# Patient Record
Sex: Female | Born: 1965 | Race: White | Hispanic: No | Smoking: Former smoker
Health system: Southern US, Community
[De-identification: ages and names within clinical notes are randomized; demographics above are authoritative.]

## PROBLEM LIST (undated history)

## (undated) DIAGNOSIS — M25551 Pain in right hip: Secondary | ICD-10-CM

## (undated) DIAGNOSIS — M5134 Other intervertebral disc degeneration, thoracic region: Secondary | ICD-10-CM

## (undated) DIAGNOSIS — M1990 Unspecified osteoarthritis, unspecified site: Secondary | ICD-10-CM

## (undated) DIAGNOSIS — L0390 Cellulitis, unspecified: Secondary | ICD-10-CM

## (undated) DIAGNOSIS — J45909 Unspecified asthma, uncomplicated: Secondary | ICD-10-CM

## (undated) DIAGNOSIS — I10 Essential (primary) hypertension: Secondary | ICD-10-CM

## (undated) DIAGNOSIS — K219 Gastro-esophageal reflux disease without esophagitis: Secondary | ICD-10-CM

## (undated) DIAGNOSIS — M419 Scoliosis, unspecified: Secondary | ICD-10-CM

## (undated) DIAGNOSIS — G8929 Other chronic pain: Secondary | ICD-10-CM

## (undated) DIAGNOSIS — K589 Irritable bowel syndrome without diarrhea: Secondary | ICD-10-CM

## (undated) DIAGNOSIS — F419 Anxiety disorder, unspecified: Secondary | ICD-10-CM

## (undated) HISTORY — PX: CHOLECYSTECTOMY: SHX55

## (undated) HISTORY — PX: TUBAL LIGATION: SHX77

## (undated) HISTORY — PX: ABDOMINAL HYSTERECTOMY: SHX81

---

## 2004-04-27 ENCOUNTER — Emergency Department: Payer: Self-pay | Admitting: Emergency Medicine

## 2004-04-30 ENCOUNTER — Ambulatory Visit: Payer: Self-pay | Admitting: Emergency Medicine

## 2004-06-09 ENCOUNTER — Inpatient Hospital Stay: Payer: Self-pay | Admitting: Internal Medicine

## 2004-09-16 ENCOUNTER — Emergency Department: Payer: Self-pay | Admitting: Unknown Physician Specialty

## 2004-11-01 ENCOUNTER — Emergency Department: Payer: Self-pay | Admitting: Emergency Medicine

## 2005-01-16 ENCOUNTER — Emergency Department: Payer: Self-pay | Admitting: Internal Medicine

## 2005-03-23 ENCOUNTER — Emergency Department: Payer: Self-pay | Admitting: Emergency Medicine

## 2005-03-29 ENCOUNTER — Emergency Department: Payer: Self-pay | Admitting: Unknown Physician Specialty

## 2005-07-25 ENCOUNTER — Other Ambulatory Visit: Payer: Self-pay

## 2005-07-25 ENCOUNTER — Emergency Department: Payer: Self-pay | Admitting: Emergency Medicine

## 2005-07-29 ENCOUNTER — Emergency Department: Payer: Self-pay | Admitting: Emergency Medicine

## 2005-07-29 ENCOUNTER — Other Ambulatory Visit: Payer: Self-pay

## 2005-08-16 ENCOUNTER — Emergency Department: Payer: Self-pay | Admitting: Emergency Medicine

## 2005-08-23 ENCOUNTER — Emergency Department: Payer: Self-pay | Admitting: Unknown Physician Specialty

## 2006-03-21 ENCOUNTER — Emergency Department: Payer: Self-pay | Admitting: Emergency Medicine

## 2007-07-01 ENCOUNTER — Emergency Department: Payer: Self-pay | Admitting: Emergency Medicine

## 2008-03-25 ENCOUNTER — Emergency Department: Payer: Self-pay | Admitting: Emergency Medicine

## 2008-05-24 ENCOUNTER — Emergency Department: Payer: Self-pay | Admitting: Emergency Medicine

## 2008-06-04 ENCOUNTER — Emergency Department: Payer: Self-pay | Admitting: Internal Medicine

## 2008-08-06 ENCOUNTER — Emergency Department: Payer: Self-pay | Admitting: Emergency Medicine

## 2008-11-19 ENCOUNTER — Emergency Department: Payer: Self-pay | Admitting: Emergency Medicine

## 2009-01-05 ENCOUNTER — Emergency Department: Payer: Self-pay | Admitting: Emergency Medicine

## 2009-01-15 ENCOUNTER — Ambulatory Visit: Payer: Self-pay

## 2009-01-22 ENCOUNTER — Ambulatory Visit: Payer: Self-pay

## 2009-01-27 ENCOUNTER — Emergency Department: Payer: Self-pay | Admitting: Emergency Medicine

## 2009-05-14 ENCOUNTER — Inpatient Hospital Stay: Payer: Self-pay | Admitting: Internal Medicine

## 2009-05-27 ENCOUNTER — Ambulatory Visit: Payer: Self-pay

## 2009-06-02 ENCOUNTER — Inpatient Hospital Stay: Payer: Self-pay | Admitting: Surgery

## 2009-06-13 ENCOUNTER — Emergency Department: Payer: Self-pay | Admitting: Emergency Medicine

## 2010-03-02 ENCOUNTER — Emergency Department: Payer: Self-pay | Admitting: Internal Medicine

## 2010-03-08 ENCOUNTER — Emergency Department: Payer: Self-pay | Admitting: Emergency Medicine

## 2010-06-28 ENCOUNTER — Emergency Department: Payer: Self-pay | Admitting: Emergency Medicine

## 2010-07-14 ENCOUNTER — Emergency Department: Payer: Self-pay | Admitting: Emergency Medicine

## 2010-08-02 ENCOUNTER — Emergency Department: Payer: Self-pay | Admitting: Unknown Physician Specialty

## 2011-01-19 ENCOUNTER — Emergency Department (HOSPITAL_COMMUNITY)
Admission: EM | Admit: 2011-01-19 | Discharge: 2011-01-19 | Disposition: A | Discharge status: Home / Self Care | Payer: Self-pay | Attending: Emergency Medicine | Admitting: Emergency Medicine

## 2011-01-19 ENCOUNTER — Encounter: Payer: Self-pay | Admitting: *Deleted

## 2011-01-19 DIAGNOSIS — K219 Gastro-esophageal reflux disease without esophagitis: Secondary | ICD-10-CM | POA: Insufficient documentation

## 2011-01-19 DIAGNOSIS — F17200 Nicotine dependence, unspecified, uncomplicated: Secondary | ICD-10-CM | POA: Insufficient documentation

## 2011-01-19 DIAGNOSIS — G8929 Other chronic pain: Secondary | ICD-10-CM | POA: Insufficient documentation

## 2011-01-19 DIAGNOSIS — M549 Dorsalgia, unspecified: Secondary | ICD-10-CM | POA: Insufficient documentation

## 2011-01-19 DIAGNOSIS — M129 Arthropathy, unspecified: Secondary | ICD-10-CM | POA: Insufficient documentation

## 2011-01-19 HISTORY — DX: Gastro-esophageal reflux disease without esophagitis: K21.9

## 2011-01-19 HISTORY — DX: Unspecified osteoarthritis, unspecified site: M19.90

## 2011-01-19 MED ORDER — DIAZEPAM 5 MG PO TABS
5.0000 mg | ORAL_TABLET | Freq: Once | ORAL | Status: AC
  Administered 2011-01-19: 5 mg via ORAL
  Filled 2011-01-19: qty 1

## 2011-01-19 MED ORDER — DEXAMETHASONE SODIUM PHOSPHATE 4 MG/ML IJ SOLN
8.0000 mg | Freq: Once | INTRAMUSCULAR | Status: AC
  Administered 2011-01-19: 8 mg via INTRAMUSCULAR
  Filled 2011-01-19: qty 2

## 2011-01-19 MED ORDER — TRAMADOL HCL 50 MG PO TABS: 50.0000 mg | ORAL_TABLET | Freq: Four times a day (QID) | ORAL | Status: AC | PRN

## 2011-01-19 MED ORDER — DEXAMETHASONE 6 MG PO TABS: 6.0000 mg | ORAL_TABLET | Freq: Two times a day (BID) | ORAL | Status: AC

## 2011-01-19 MED ORDER — METHOCARBAMOL 500 MG PO TABS: ORAL_TABLET | ORAL | Status: DC

## 2011-01-19 MED ORDER — TRAMADOL HCL 50 MG PO TABS
50.0000 mg | ORAL_TABLET | Freq: Once | ORAL | Status: AC
  Administered 2011-01-19: 50 mg via ORAL
  Filled 2011-01-19: qty 1

## 2011-01-19 NOTE — ED Provider Notes (Signed)
History     CSN: [id number] Arrival date & time: 01/19/2011  4:43 PM  Chief Complaint  Patient presents with  . Back Pain   Patient is a 45 y.o. female presenting with back pain. The history is provided by the patient.  Back Pain  This is a chronic problem. The problem occurs daily. The problem has not changed since onset.Associated with: chronic. The pain is present in the lumbar spine. The quality of the pain is described as aching. The pain is at a severity of 9/10. The pain is severe. Exacerbated by: movement. Pertinent negatives include no chest pain, no abdominal pain and no dysuria. She has tried NSAIDs and analgesics for the symptoms.    Past Medical History  Diagnosis Date  . Arthritis   . GERD (gastroesophageal reflux disease)     Past Surgical History  Procedure Date  . Abdominal hysterectomy   . Tubal ligation     History reviewed. No pertinent family history.  History  Substance Use Topics  . Smoking status: Current Some Day Smoker  . Smokeless tobacco: Not on file  . Alcohol Use: No    OB History    Grav Para Term Preterm Abortions TAB SAB Ect Mult Living                  Review of Systems  Constitutional: Negative for activity change.       All ROS Neg except as noted in HPI  HENT: Negative for nosebleeds and neck pain.   Eyes: Negative for photophobia and discharge.  Respiratory: Negative for cough, shortness of breath and wheezing.   Cardiovascular: Negative for chest pain and palpitations.  Gastrointestinal: Negative for abdominal pain and blood in stool.  Genitourinary: Negative for dysuria, frequency and hematuria.  Musculoskeletal: Positive for back pain. Negative for arthralgias.  Skin: Negative.   Neurological: Negative for dizziness, seizures and speech difficulty.  Psychiatric/Behavioral: Negative for hallucinations and confusion.    Physical Exam  BP 141/81  Pulse 84  Temp(Src) 97.9 F (36.6 C) (Oral)  Resp 18  Ht 5\' 4"  (1.626 m)   Wt 170 lb (77.111 kg)  BMI 29.18 kg/m2  SpO2 100%  Physical Exam  Nursing note and vitals reviewed. Constitutional: She is oriented to person, place, and time. She appears well-developed and well-nourished.  Non-toxic appearance.  HENT:  Head: Normocephalic.  Right Ear: Tympanic membrane and external ear normal.  Left Ear: Tympanic membrane and external ear normal.  Eyes: EOM and lids are normal. Pupils are equal, round, and reactive to light.  Neck: Normal range of motion. Neck supple. Carotid bruit is not present.  Cardiovascular: Normal rate, regular rhythm, normal heart sounds, intact distal pulses and normal pulses.   Pulmonary/Chest: Breath sounds normal. No respiratory distress.  Abdominal: Soft. Bowel sounds are normal. There is no tenderness. There is no guarding.  Musculoskeletal: Normal range of motion.       Pain to palpation of the lower lumbar area. Palpable spasm with attempted ROM.  Lymphadenopathy:       Head (right side): No submandibular adenopathy present.       Head (left side): No submandibular adenopathy present.    She has no cervical adenopathy.  Neurological: She is alert and oriented to person, place, and time. She has normal strength. No cranial nerve deficit or sensory deficit. She exhibits normal muscle tone. Coordination normal.  Skin: Skin is warm and dry.  Psychiatric: She has a normal mood and affect. Her speech  is normal.    ED Course: Rx for Robaxin, Dexamethasone, an Tramadol given. Discussed need for Eval by the [facility].  Procedures  MDM I have reviewed nursing notes, vital signs, and all appropriate lab and imaging results for this patient.      Kathie Dike, [place] 01/19/11 [phone]
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
## 2011-01-19 NOTE — ED Provider Notes (Signed)
Medical screening examination/treatment/procedure(s) were performed by non-physician practitioner and as supervising physician I was immediately available for consultation/collaboration.   Lyanne Co, MD 01/19/11 1728
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
## 2011-01-20 ENCOUNTER — Emergency Department: Payer: Self-pay | Admitting: Emergency Medicine

## 2011-02-10 ENCOUNTER — Emergency Department: Payer: Self-pay | Admitting: Emergency Medicine

## 2011-03-03 ENCOUNTER — Emergency Department (HOSPITAL_COMMUNITY)
Admission: EM | Admit: 2011-03-03 | Discharge: 2011-03-03 | Disposition: A | Discharge status: Home / Self Care | Payer: No Typology Code available for payment source | Attending: Emergency Medicine | Admitting: Emergency Medicine

## 2011-03-03 ENCOUNTER — Emergency Department (HOSPITAL_COMMUNITY): Payer: No Typology Code available for payment source

## 2011-03-03 DIAGNOSIS — M545 Low back pain, unspecified: Secondary | ICD-10-CM | POA: Insufficient documentation

## 2011-03-03 DIAGNOSIS — K219 Gastro-esophageal reflux disease without esophagitis: Secondary | ICD-10-CM | POA: Insufficient documentation

## 2011-03-03 DIAGNOSIS — J45909 Unspecified asthma, uncomplicated: Secondary | ICD-10-CM | POA: Insufficient documentation

## 2011-03-03 DIAGNOSIS — M549 Dorsalgia, unspecified: Secondary | ICD-10-CM | POA: Insufficient documentation

## 2011-07-19 ENCOUNTER — Emergency Department: Payer: Self-pay | Admitting: Emergency Medicine

## 2011-07-19 LAB — CBC
HCT: 41.7 % (ref 35.0–47.0)
HGB: 14.2 g/dL (ref 12.0–16.0)
RBC: 4.48 10*6/uL (ref 3.80–5.20)
RDW: 13.4 % (ref 11.5–14.5)
WBC: 11.1 10*3/uL — ABNORMAL HIGH (ref 3.6–11.0)

## 2011-07-19 LAB — COMPREHENSIVE METABOLIC PANEL
Albumin: 3.4 g/dL (ref 3.4–5.0)
Alkaline Phosphatase: 64 U/L (ref 50–136)
BUN: 12 mg/dL (ref 7–18)
Calcium, Total: 8.4 mg/dL — ABNORMAL LOW (ref 8.5–10.1)
Glucose: 96 mg/dL (ref 65–99)
Osmolality: 281 (ref 275–301)
Potassium: 3.6 mmol/L (ref 3.5–5.1)
SGOT(AST): 18 U/L (ref 15–37)
Sodium: 141 mmol/L (ref 136–145)
Total Protein: 7.1 g/dL (ref 6.4–8.2)

## 2011-07-19 LAB — LIPASE, BLOOD: Lipase: 169 U/L (ref 73–393)

## 2011-11-30 ENCOUNTER — Emergency Department: Payer: Self-pay | Admitting: Unknown Physician Specialty

## 2011-12-01 LAB — TROPONIN I: Troponin-I: <0.02 ng/mL

## 2011-12-01 LAB — CBC
HCT: 43.8 % (ref 35.0–47.0)
HGB: 14.1 g/dL (ref 12.0–16.0)
RDW: 13.6 % (ref 11.5–14.5)
WBC: 10.1 10*3/uL (ref 3.6–11.0)

## 2011-12-01 LAB — COMPREHENSIVE METABOLIC PANEL
Albumin: 3.5 g/dL (ref 3.4–5.0)
Alkaline Phosphatase: 75 U/L (ref 50–136)
BUN: 13 mg/dL (ref 7–18)
Calcium, Total: 8.7 mg/dL (ref 8.5–10.1)
Co2: 26 mmol/L (ref 21–32)
EGFR (Non-African Amer.): >60
Glucose: 97 mg/dL (ref 65–99)
Potassium: 3.6 mmol/L (ref 3.5–5.1)
SGOT(AST): 23 U/L (ref 15–37)
Sodium: 141 mmol/L (ref 136–145)

## 2012-04-28 ENCOUNTER — Emergency Department: Payer: Self-pay | Admitting: Emergency Medicine

## 2012-04-28 LAB — CBC WITH DIFFERENTIAL/PLATELET
Basophil #: 0 10*3/uL (ref 0.0–0.1)
Basophil %: 0.5 %
HCT: 42.2 % (ref 35.0–47.0)
HGB: 14.2 g/dL (ref 12.0–16.0)
Lymphocyte %: 28.2 %
Monocyte %: 6.8 %
Neutrophil #: 5.2 10*3/uL (ref 1.4–6.5)
WBC: 8.8 10*3/uL (ref 3.6–11.0)

## 2012-04-28 LAB — BASIC METABOLIC PANEL
Calcium, Total: 8.9 mg/dL (ref 8.5–10.1)
Co2: 26 mmol/L (ref 21–32)
Creatinine: 0.68 mg/dL (ref 0.60–1.30)
EGFR (Non-African Amer.): >60
Glucose: 88 mg/dL (ref 65–99)
Potassium: 3.7 mmol/L (ref 3.5–5.1)
Sodium: 139 mmol/L (ref 136–145)

## 2012-08-21 ENCOUNTER — Emergency Department: Payer: Self-pay | Admitting: Emergency Medicine

## 2012-12-05 ENCOUNTER — Emergency Department: Payer: Self-pay | Admitting: Emergency Medicine

## 2013-01-23 ENCOUNTER — Emergency Department: Payer: Self-pay | Admitting: Emergency Medicine

## 2013-03-13 ENCOUNTER — Emergency Department: Payer: Self-pay | Admitting: Internal Medicine

## 2013-03-13 LAB — URINALYSIS, COMPLETE
Bacteria: NONE SEEN
Bilirubin,UR: NEGATIVE
Glucose,UR: NEGATIVE mg/dL (ref 0–75)
Ph: 8 (ref 4.5–8.0)
Protein: >=500
Specific Gravity: 1.02 (ref 1.003–1.030)
Squamous Epithelial: 4
WBC UR: 457 /HPF (ref 0–5)

## 2013-03-13 LAB — BASIC METABOLIC PANEL
Anion Gap: 4 — ABNORMAL LOW (ref 7–16)
Co2: 28 mmol/L (ref 21–32)
EGFR (African American): >60
Osmolality: 275 (ref 275–301)
Sodium: 138 mmol/L (ref 136–145)

## 2013-03-13 LAB — CBC
MCHC: 34.5 g/dL (ref 32.0–36.0)
MCV: 92 fL (ref 80–100)
Platelet: 250 10*3/uL (ref 150–440)
RDW: 13.4 % (ref 11.5–14.5)
WBC: 11 10*3/uL (ref 3.6–11.0)

## 2013-08-12 ENCOUNTER — Emergency Department: Payer: Self-pay | Admitting: Emergency Medicine

## 2013-12-30 ENCOUNTER — Emergency Department: Payer: Self-pay | Admitting: Emergency Medicine

## 2013-12-30 LAB — CBC
HCT: 41.1 % (ref 35.0–47.0)
HGB: 13.4 g/dL (ref 12.0–16.0)
MCH: 30.5 pg (ref 26.0–34.0)
MCHC: 32.6 g/dL (ref 32.0–36.0)
MCV: 94 fL (ref 80–100)
Platelet: 220 10*3/uL (ref 150–440)
RBC: 4.39 10*6/uL (ref 3.80–5.20)
RDW: 13.7 % (ref 11.5–14.5)
WBC: 7.5 10*3/uL (ref 3.6–11.0)

## 2013-12-30 LAB — BASIC METABOLIC PANEL
Anion Gap: 7 (ref 7–16)
BUN: 16 mg/dL (ref 7–18)
CREATININE: 0.9 mg/dL (ref 0.60–1.30)
Calcium, Total: 8.6 mg/dL (ref 8.5–10.1)
Chloride: 109 mmol/L — ABNORMAL HIGH (ref 98–107)
Co2: 26 mmol/L (ref 21–32)
EGFR (Non-African Amer.): >60
Glucose: 98 mg/dL (ref 65–99)
OSMOLALITY: 284 (ref 275–301)
POTASSIUM: 3.7 mmol/L (ref 3.5–5.1)
SODIUM: 142 mmol/L (ref 136–145)

## 2013-12-30 LAB — CK TOTAL AND CKMB (NOT AT ARMC)
CK, Total: 208 U/L — ABNORMAL HIGH
CK-MB: 2.4 ng/mL (ref 0.5–3.6)

## 2013-12-30 LAB — TROPONIN I

## 2014-02-24 ENCOUNTER — Emergency Department: Payer: Self-pay | Admitting: Emergency Medicine

## 2014-02-24 LAB — CBC WITH DIFFERENTIAL/PLATELET
BASOS PCT: 0.8 %
Basophil #: 0.1 10*3/uL (ref 0.0–0.1)
EOS ABS: 0.3 10*3/uL (ref 0.0–0.7)
EOS PCT: 4.5 %
HCT: 44.4 % (ref 35.0–47.0)
HGB: 14.2 g/dL (ref 12.0–16.0)
LYMPHS ABS: 1.8 10*3/uL (ref 1.0–3.6)
Lymphocyte %: 26.7 %
MCH: 29.9 pg (ref 26.0–34.0)
MCHC: 31.9 g/dL — AB (ref 32.0–36.0)
MCV: 94 fL (ref 80–100)
Monocyte #: 0.4 x10 3/mm (ref 0.2–0.9)
Monocyte %: 6.5 %
NEUTROS PCT: 61.5 %
Neutrophil #: 4.1 10*3/uL (ref 1.4–6.5)
Platelet: 244 10*3/uL (ref 150–440)
RBC: 4.74 10*6/uL (ref 3.80–5.20)
RDW: 14 % (ref 11.5–14.5)
WBC: 6.6 10*3/uL (ref 3.6–11.0)

## 2014-02-24 LAB — COMPREHENSIVE METABOLIC PANEL
ALT: 14 U/L
AST: 24 U/L (ref 15–37)
Albumin: 3.4 g/dL (ref 3.4–5.0)
Alkaline Phosphatase: 74 U/L
Anion Gap: 6 — ABNORMAL LOW (ref 7–16)
BILIRUBIN TOTAL: 0.4 mg/dL (ref 0.2–1.0)
BUN: 13 mg/dL (ref 7–18)
CALCIUM: 8 mg/dL — AB (ref 8.5–10.1)
CHLORIDE: 108 mmol/L — AB (ref 98–107)
CO2: 26 mmol/L (ref 21–32)
Creatinine: 0.83 mg/dL (ref 0.60–1.30)
GLUCOSE: 97 mg/dL (ref 65–99)
Osmolality: 279 (ref 275–301)
POTASSIUM: 3.7 mmol/L (ref 3.5–5.1)
Sodium: 140 mmol/L (ref 136–145)
TOTAL PROTEIN: 7.2 g/dL (ref 6.4–8.2)

## 2014-02-24 LAB — URINALYSIS, COMPLETE
Bacteria: NONE SEEN
Bilirubin,UR: NEGATIVE
Glucose,UR: NEGATIVE mg/dL (ref 0–75)
KETONE: NEGATIVE
LEUKOCYTE ESTERASE: NEGATIVE
Nitrite: NEGATIVE
Ph: 6 (ref 4.5–8.0)
Protein: NEGATIVE
RBC,UR: 3 /HPF (ref 0–5)
SPECIFIC GRAVITY: 1.023 (ref 1.003–1.030)
Squamous Epithelial: 24

## 2014-02-24 LAB — TROPONIN I

## 2014-02-27 ENCOUNTER — Emergency Department: Payer: Self-pay | Admitting: Emergency Medicine

## 2014-02-27 LAB — BASIC METABOLIC PANEL
Anion Gap: 7 (ref 7–16)
BUN: 9 mg/dL (ref 7–18)
CALCIUM: 8 mg/dL — AB (ref 8.5–10.1)
CHLORIDE: 109 mmol/L — AB (ref 98–107)
Co2: 27 mmol/L (ref 21–32)
Creatinine: 0.95 mg/dL (ref 0.60–1.30)
EGFR (African American): >60
EGFR (Non-African Amer.): >60
Glucose: 83 mg/dL (ref 65–99)
OSMOLALITY: 283 (ref 275–301)
Potassium: 3.5 mmol/L (ref 3.5–5.1)
Sodium: 143 mmol/L (ref 136–145)

## 2014-02-27 LAB — CBC WITH DIFFERENTIAL/PLATELET
BASOS ABS: 0 10*3/uL (ref 0.0–0.1)
Basophil %: 0.5 %
EOS ABS: 0.5 10*3/uL (ref 0.0–0.7)
EOS PCT: 5 %
HCT: 43 % (ref 35.0–47.0)
HGB: 14.2 g/dL (ref 12.0–16.0)
Lymphocyte #: 1.8 10*3/uL (ref 1.0–3.6)
Lymphocyte %: 19.4 %
MCH: 30.7 pg (ref 26.0–34.0)
MCHC: 33.1 g/dL (ref 32.0–36.0)
MCV: 93 fL (ref 80–100)
Monocyte #: 0.6 x10 3/mm (ref 0.2–0.9)
Monocyte %: 6.1 %
NEUTROS ABS: 6.3 10*3/uL (ref 1.4–6.5)
Neutrophil %: 69 %
Platelet: 241 10*3/uL (ref 150–440)
RBC: 4.63 10*6/uL (ref 3.80–5.20)
RDW: 13.7 % (ref 11.5–14.5)
WBC: 9.2 10*3/uL (ref 3.6–11.0)

## 2014-02-27 LAB — URINALYSIS, COMPLETE
Bacteria: NONE SEEN
Bilirubin,UR: NEGATIVE
GLUCOSE, UR: NEGATIVE mg/dL (ref 0–75)
Ketone: NEGATIVE
LEUKOCYTE ESTERASE: NEGATIVE
Nitrite: NEGATIVE
PH: 5 (ref 4.5–8.0)
Protein: NEGATIVE
RBC,UR: 4 /HPF (ref 0–5)
SPECIFIC GRAVITY: 1.027 (ref 1.003–1.030)
WBC UR: 3 /HPF (ref 0–5)

## 2014-02-27 LAB — TROPONIN I

## 2014-03-17 ENCOUNTER — Emergency Department: Payer: Self-pay | Admitting: Emergency Medicine

## 2014-05-01 ENCOUNTER — Emergency Department: Payer: Self-pay | Admitting: Emergency Medicine

## 2014-05-01 LAB — BASIC METABOLIC PANEL
Anion Gap: 6 — ABNORMAL LOW (ref 7–16)
BUN: 9 mg/dL (ref 7–18)
CALCIUM: 8.4 mg/dL — AB (ref 8.5–10.1)
CO2: 29 mmol/L (ref 21–32)
CREATININE: 0.96 mg/dL (ref 0.60–1.30)
Chloride: 105 mmol/L (ref 98–107)
EGFR (African American): >60
GLUCOSE: 97 mg/dL (ref 65–99)
Osmolality: 278 (ref 275–301)
Potassium: 3.9 mmol/L (ref 3.5–5.1)
Sodium: 140 mmol/L (ref 136–145)

## 2014-05-01 LAB — CBC
HCT: 45.2 % (ref 35.0–47.0)
HGB: 14.8 g/dL (ref 12.0–16.0)
MCH: 30.5 pg (ref 26.0–34.0)
MCHC: 32.8 g/dL (ref 32.0–36.0)
MCV: 93 fL (ref 80–100)
PLATELETS: 250 10*3/uL (ref 150–440)
RBC: 4.86 10*6/uL (ref 3.80–5.20)
RDW: 13.9 % (ref 11.5–14.5)
WBC: 15 10*3/uL — AB (ref 3.6–11.0)

## 2014-05-01 LAB — TROPONIN I

## 2014-05-13 ENCOUNTER — Emergency Department: Payer: Self-pay | Admitting: Emergency Medicine

## 2014-05-13 LAB — TROPONIN I: Troponin-I: <0.02 ng/mL

## 2014-05-13 LAB — COMPREHENSIVE METABOLIC PANEL
ALBUMIN: 3.3 g/dL — AB (ref 3.4–5.0)
ALK PHOS: 70 U/L
Anion Gap: 6 — ABNORMAL LOW (ref 7–16)
BUN: 13 mg/dL (ref 7–18)
Bilirubin,Total: 0.3 mg/dL (ref 0.2–1.0)
CALCIUM: 8.3 mg/dL — AB (ref 8.5–10.1)
CHLORIDE: 107 mmol/L (ref 98–107)
CREATININE: 1.15 mg/dL (ref 0.60–1.30)
Co2: 28 mmol/L (ref 21–32)
EGFR (African American): >60
GFR CALC NON AF AMER: 54 — AB
GLUCOSE: 88 mg/dL (ref 65–99)
Osmolality: 281 (ref 275–301)
POTASSIUM: 3.9 mmol/L (ref 3.5–5.1)
SGOT(AST): 22 U/L (ref 15–37)
SGPT (ALT): 12 U/L — ABNORMAL LOW
SODIUM: 141 mmol/L (ref 136–145)
Total Protein: 6.7 g/dL (ref 6.4–8.2)

## 2014-05-13 LAB — LIPASE, BLOOD: Lipase: 203 U/L (ref 73–393)

## 2014-05-13 LAB — CBC
HCT: 41.9 % (ref 35.0–47.0)
HGB: 13.9 g/dL (ref 12.0–16.0)
MCH: 30.7 pg (ref 26.0–34.0)
MCHC: 33.3 g/dL (ref 32.0–36.0)
MCV: 92 fL (ref 80–100)
Platelet: 250 10*3/uL (ref 150–440)
RBC: 4.55 10*6/uL (ref 3.80–5.20)
RDW: 14.3 % (ref 11.5–14.5)
WBC: 9.3 10*3/uL (ref 3.6–11.0)

## 2014-06-01 ENCOUNTER — Emergency Department: Payer: Self-pay | Admitting: Emergency Medicine

## 2014-10-16 ENCOUNTER — Encounter: Payer: Self-pay | Admitting: Emergency Medicine

## 2014-10-16 DIAGNOSIS — Z79899 Other long term (current) drug therapy: Secondary | ICD-10-CM | POA: Insufficient documentation

## 2014-10-16 DIAGNOSIS — Z791 Long term (current) use of non-steroidal anti-inflammatories (NSAID): Secondary | ICD-10-CM | POA: Insufficient documentation

## 2014-10-16 DIAGNOSIS — G8929 Other chronic pain: Secondary | ICD-10-CM | POA: Insufficient documentation

## 2014-10-16 DIAGNOSIS — Z720 Tobacco use: Secondary | ICD-10-CM | POA: Insufficient documentation

## 2014-10-16 DIAGNOSIS — Z880 Allergy status to penicillin: Secondary | ICD-10-CM | POA: Insufficient documentation

## 2014-10-16 DIAGNOSIS — M5431 Sciatica, right side: Secondary | ICD-10-CM | POA: Insufficient documentation
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
## 2014-10-17 ENCOUNTER — Emergency Department
Admission: EM | Admit: 2014-10-17 | Discharge: 2014-10-17 | Disposition: A | Discharge status: Home / Self Care | Payer: Self-pay | Attending: Emergency Medicine | Admitting: Emergency Medicine

## 2014-10-17 DIAGNOSIS — M25551 Pain in right hip: Secondary | ICD-10-CM

## 2014-10-17 DIAGNOSIS — M5431 Sciatica, right side: Secondary | ICD-10-CM

## 2014-10-17 HISTORY — DX: Other chronic pain: G89.29

## 2014-10-17 HISTORY — DX: Pain in right hip: M25.551

## 2014-10-17 MED ORDER — MORPHINE SULFATE 4 MG/ML IJ SOLN
INTRAMUSCULAR | Status: AC
  Administered 2014-10-17: 4 mg via INTRAMUSCULAR
  Filled 2014-10-17: qty 1

## 2014-10-17 MED ORDER — NAPROXEN 500 MG PO TABS: 500.0000 mg | ORAL_TABLET | Freq: Two times a day (BID) | ORAL | Status: DC

## 2014-10-17 MED ORDER — MORPHINE SULFATE 4 MG/ML IJ SOLN
4.0000 mg | Freq: Once | INTRAMUSCULAR | Status: AC
Start: 2014-10-17 — End: 2014-10-17
  Administered 2014-10-17: 4 mg via INTRAMUSCULAR

## 2014-10-17 MED ORDER — KETOROLAC TROMETHAMINE 30 MG/ML IJ SOLN: 60.0000 mg | Freq: Once | INTRAMUSCULAR | Status: AC

## 2014-10-17 MED ORDER — DIAZEPAM 2 MG PO TABS
ORAL_TABLET | ORAL | Status: AC
Start: 2014-10-17 — End: 2014-10-17
  Administered 2014-10-17: 2 mg via ORAL
  Filled 2014-10-17: qty 1

## 2014-10-17 MED ORDER — KETOROLAC TROMETHAMINE 60 MG/2ML IM SOLN
INTRAMUSCULAR | Status: AC
  Administered 2014-10-17: 60 mg
  Filled 2014-10-17: qty 2

## 2014-10-17 MED ORDER — DIAZEPAM 2 MG PO TABS: 2.0000 mg | ORAL_TABLET | Freq: Three times a day (TID) | ORAL | Status: DC | PRN

## 2014-10-17 MED ORDER — PROMETHAZINE HCL 25 MG PO TABS
25.0000 mg | ORAL_TABLET | Freq: Once | ORAL | Status: AC
  Administered 2014-10-17: 25 mg via ORAL

## 2014-10-17 MED ORDER — PROMETHAZINE HCL 25 MG PO TABS
ORAL_TABLET | ORAL | Status: AC
Start: 2014-10-17 — End: 2014-10-17
  Administered 2014-10-17: 25 mg via ORAL
  Filled 2014-10-17: qty 1

## 2014-10-17 MED ORDER — OXYCODONE-ACETAMINOPHEN 5-325 MG PO TABS: 1.0000 | ORAL_TABLET | ORAL | Status: DC | PRN

## 2014-10-17 MED ORDER — DIAZEPAM 2 MG PO TABS
2.0000 mg | ORAL_TABLET | Freq: Once | ORAL | Status: AC
  Administered 2014-10-17: 2 mg via ORAL
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
## 2014-10-17 NOTE — Discharge Instructions (Signed)
1. Take medicines as needed for pain and muscle spasms and (Naprosyn/Percocet/Valium #15).  2. Apply moist heat to affected area several times daily.  3. Return to the ER for worsening symptoms, swelling, numbness/tingling weakness or other concerns.  Hip Pain Your hip is the joint between your upper legs and your lower pelvis. The bones, cartilage, tendons, and muscles of your hip joint perform a lot of work each day supporting your body weight and allowing you to move around. Hip pain can range from a minor ache to severe pain in one or both of your hips. Pain may be felt on the inside of the hip joint near the groin, or the outside near the buttocks and upper thigh. You may have swelling or stiffness as well.  HOME CARE INSTRUCTIONS   Take medicines only as directed by your health care provider.  Apply ice to the injured area:  Put ice in a plastic bag.  Place a towel between your skin and the bag.  Leave the ice on for 15-20 minutes at a time, 3-4 times a day.  Keep your leg raised (elevated) when possible to lessen swelling.  Avoid activities that cause pain.  Follow specific exercises as directed by your health care provider.  Sleep with a pillow between your legs on your most comfortable side.  Record how often you have hip pain, the location of the pain, and what it feels like. SEEK MEDICAL CARE IF:   You are unable to put weight on your leg.  Your hip is red or swollen or very tender to touch.  Your pain or swelling continues or worsens after 1 week.  You have increasing difficulty walking.  You have a fever. SEEK IMMEDIATE MEDICAL CARE IF:   You have fallen.  You have a sudden increase in pain and swelling in your hip. MAKE SURE YOU:   Understand these instructions.  Will watch your condition.  Will get help right away if you are not doing well or get worse. Document Released: 10/20/2009 Document Revised: 09/16/2013 Document Reviewed: 12/27/2012 [facility]  Patient Information 2015 [employer] [place]. This information is not intended to replace advice given to you by your health care provider. Make sure you discuss any questions you have with your health care provider.  Sciatica Sciatica is pain, weakness, numbness, or tingling along the path of the sciatic nerve. The nerve starts in the lower back and runs down the back of each leg. The nerve controls the muscles in the lower leg and in the back of the knee, while also providing sensation to the back of the thigh, lower leg, and the sole of your foot. Sciatica is a symptom of another medical condition. For instance, nerve damage or certain conditions, such as a herniated disk or bone spur on the spine, pinch or put pressure on the sciatic nerve. This causes the pain, weakness, or other sensations normally associated with sciatica. Generally, sciatica only affects one side of the body. CAUSES   Herniated or slipped disc.  Degenerative disk disease.  A pain disorder involving the narrow muscle in the buttocks (piriformis syndrome).  Pelvic injury or fracture.  Pregnancy.  Tumor (rare). SYMPTOMS  Symptoms can vary from mild to very severe. The symptoms usually travel from the low back to the buttocks and down the back of the leg. Symptoms can include:  Mild tingling or dull aches in the lower back, leg, or hip.  Numbness in the back of the calf or sole of the  foot.  Burning sensations in the lower back, leg, or hip.  Sharp pains in the lower back, leg, or hip.  Leg weakness.  Severe back pain inhibiting movement. These symptoms may get worse with coughing, sneezing, laughing, or prolonged sitting or standing. Also, being overweight may worsen symptoms. DIAGNOSIS  Your caregiver will perform a physical exam to look for common symptoms of sciatica. He or she may ask you to do certain movements or activities that would trigger sciatic nerve pain. Other tests may be performed to find the cause of  the sciatica. These may include:  Blood tests.  X-rays.  Imaging tests, such as an MRI or CT scan. TREATMENT  Treatment is directed at the cause of the sciatic pain. Sometimes, treatment is not necessary and the pain and discomfort goes away on its own. If treatment is needed, your caregiver may suggest:  Over-the-counter medicines to relieve pain.  Prescription medicines, such as anti-inflammatory medicine, muscle relaxants, or narcotics.  Applying heat or ice to the painful area.  Steroid injections to lessen pain, irritation, and inflammation around the nerve.  Reducing activity during periods of pain.  Exercising and stretching to strengthen your abdomen and improve flexibility of your spine. Your caregiver may suggest losing weight if the extra weight makes the back pain worse.  Physical therapy.  Surgery to eliminate what is pressing or pinching the nerve, such as a bone spur or part of a herniated disk. HOME CARE INSTRUCTIONS   Only take over-the-counter or prescription medicines for pain or discomfort as directed by your caregiver.  Apply ice to the affected area for 20 minutes, 3-4 times a day for the first 48-72 hours. Then try heat in the same way.  Exercise, stretch, or perform your usual activities if these do not aggravate your pain.  Attend physical therapy sessions as directed by your caregiver.  Keep all follow-up appointments as directed by your caregiver.  Do not wear high heels or shoes that do not provide proper support.  Check your mattress to see if it is too soft. A firm mattress may lessen your pain and discomfort. SEEK IMMEDIATE MEDICAL CARE IF:   You lose control of your bowel or bladder (incontinence).  You have increasing weakness in the lower back, pelvis, buttocks, or legs.  You have redness or swelling of your back.  You have a burning sensation when you urinate.  You have pain that gets worse when you lie down or awakens you at  night.  Your pain is worse than you have experienced in the past.  Your pain is lasting longer than 4 weeks.  You are suddenly losing weight without reason. MAKE SURE YOU:  Understand these instructions.  Will watch your condition.  Will get help right away if you are not doing well or get worse. Document Released: 04/26/2001 Document Revised: 11/01/2011 Document Reviewed: 09/11/2011 [facility] Patient Information 2015 [place], [place]. This information is not intended to replace advice given to you by your health care provider. Make sure you discuss any questions you have with your health care provider.

## 2014-10-19 ENCOUNTER — Encounter: Payer: Self-pay | Admitting: Emergency Medicine

## 2014-10-19 ENCOUNTER — Emergency Department
Admission: EM | Admit: 2014-10-19 | Discharge: 2014-10-19 | Disposition: A | Discharge status: Home / Self Care | Payer: Self-pay | Attending: Emergency Medicine | Admitting: Emergency Medicine

## 2014-10-19 ENCOUNTER — Emergency Department: Payer: Self-pay

## 2014-10-19 DIAGNOSIS — Z79899 Other long term (current) drug therapy: Secondary | ICD-10-CM | POA: Insufficient documentation

## 2014-10-19 DIAGNOSIS — R109 Unspecified abdominal pain: Secondary | ICD-10-CM | POA: Insufficient documentation

## 2014-10-19 DIAGNOSIS — R197 Diarrhea, unspecified: Secondary | ICD-10-CM | POA: Insufficient documentation

## 2014-10-19 DIAGNOSIS — R112 Nausea with vomiting, unspecified: Secondary | ICD-10-CM | POA: Insufficient documentation

## 2014-10-19 DIAGNOSIS — Z720 Tobacco use: Secondary | ICD-10-CM | POA: Insufficient documentation

## 2014-10-19 DIAGNOSIS — Z880 Allergy status to penicillin: Secondary | ICD-10-CM | POA: Insufficient documentation

## 2014-10-19 DIAGNOSIS — M25551 Pain in right hip: Secondary | ICD-10-CM | POA: Insufficient documentation

## 2014-10-19 DIAGNOSIS — M545 Low back pain: Secondary | ICD-10-CM | POA: Insufficient documentation

## 2014-10-19 DIAGNOSIS — Z791 Long term (current) use of non-steroidal anti-inflammatories (NSAID): Secondary | ICD-10-CM | POA: Insufficient documentation

## 2014-10-19 HISTORY — DX: Scoliosis, unspecified: M41.9

## 2014-10-19 HISTORY — DX: Unspecified asthma, uncomplicated: J45.909

## 2014-10-19 HISTORY — DX: Other intervertebral disc degeneration, thoracic region: M51.34

## 2014-10-19 HISTORY — DX: Irritable bowel syndrome, unspecified: K58.9

## 2014-10-19 LAB — COMPREHENSIVE METABOLIC PANEL
ALT: 8 U/L — ABNORMAL LOW (ref 14–54)
ANION GAP: 5 (ref 5–15)
AST: 20 U/L (ref 15–41)
Albumin: 3.6 g/dL (ref 3.5–5.0)
Alkaline Phosphatase: 55 U/L (ref 38–126)
BILIRUBIN TOTAL: 0.5 mg/dL (ref 0.3–1.2)
BUN: 17 mg/dL (ref 6–20)
CO2: 26 mmol/L (ref 22–32)
Calcium: 8.4 mg/dL — ABNORMAL LOW (ref 8.9–10.3)
Chloride: 107 mmol/L (ref 101–111)
Creatinine, Ser: 0.81 mg/dL (ref 0.44–1.00)
GFR calc non Af Amer: >60 mL/min (ref >60)
GLUCOSE: 115 mg/dL — AB (ref 65–99)
Potassium: 3.9 mmol/L (ref 3.5–5.1)
SODIUM: 138 mmol/L (ref 135–145)
TOTAL PROTEIN: 6.5 g/dL (ref 6.5–8.1)

## 2014-10-19 LAB — CBC WITH DIFFERENTIAL/PLATELET
BASOS ABS: 0.2 10*3/uL — AB (ref 0–0.1)
Basophils Relative: 3 %
EOS ABS: 0.3 10*3/uL (ref 0–0.7)
Eosinophils Relative: 4 %
HEMATOCRIT: 44.4 % (ref 35.0–47.0)
Hemoglobin: 14.3 g/dL (ref 12.0–16.0)
LYMPHS PCT: 20 %
Lymphs Abs: 1.7 10*3/uL (ref 1.0–3.6)
MCH: 29.6 pg (ref 26.0–34.0)
MCHC: 32.1 g/dL (ref 32.0–36.0)
MCV: 91.9 fL (ref 80.0–100.0)
MONO ABS: 0.5 10*3/uL (ref 0.2–0.9)
Monocytes Relative: 6 %
NEUTROS ABS: 6 10*3/uL (ref 1.4–6.5)
NEUTROS PCT: 67 %
PLATELETS: 228 10*3/uL (ref 150–440)
RBC: 4.83 MIL/uL (ref 3.80–5.20)
RDW: 14.3 % (ref 11.5–14.5)
WBC: 8.8 10*3/uL (ref 3.6–11.0)

## 2014-10-19 LAB — URINALYSIS COMPLETE WITH MICROSCOPIC (ARMC ONLY)
Glucose, UA: NEGATIVE mg/dL
NITRITE: NEGATIVE
PH: 5 (ref 5.0–8.0)
PROTEIN: 30 mg/dL — AB
SPECIFIC GRAVITY, URINE: 1.04 — AB (ref 1.005–1.030)

## 2014-10-19 MED ORDER — OXYCODONE-ACETAMINOPHEN 5-325 MG PO TABS
1.0000 | ORAL_TABLET | Freq: Once | ORAL | Status: AC
Start: 2014-10-19 — End: 2014-10-19
  Administered 2014-10-19: 1 via ORAL

## 2014-10-19 MED ORDER — ONDANSETRON 4 MG PO TBDP
ORAL_TABLET | ORAL | Status: DC
Start: 2014-10-19 — End: 2014-10-20
  Filled 2014-10-19: qty 1

## 2014-10-19 MED ORDER — PROMETHAZINE HCL 25 MG PO TABS
ORAL_TABLET | ORAL | Status: AC
  Administered 2014-10-19: 25 mg via ORAL
  Filled 2014-10-19: qty 1

## 2014-10-19 MED ORDER — OXYCODONE-ACETAMINOPHEN 5-325 MG PO TABS
ORAL_TABLET | ORAL | Status: AC
  Administered 2014-10-19: 1 via ORAL
  Filled 2014-10-19: qty 1

## 2014-10-19 MED ORDER — DICYCLOMINE HCL 20 MG PO TABS: 20.0000 mg | ORAL_TABLET | Freq: Three times a day (TID) | ORAL | Status: DC | PRN

## 2014-10-19 MED ORDER — PROMETHAZINE HCL 25 MG PO TABS
25.0000 mg | ORAL_TABLET | Freq: Once | ORAL | Status: AC
  Administered 2014-10-19: 25 mg via ORAL

## 2014-10-19 MED ORDER — ONDANSETRON 4 MG PO TBDP: 4.0000 mg | ORAL_TABLET | Freq: Once | ORAL | Status: DC

## 2014-10-19 NOTE — Discharge Instructions (Signed)
Please seek medical attention for any high fevers, chest pain, shortness of breath, change in behavior, persistent vomiting, bloody stool or any other new or concerning symptoms. ° °Abdominal Pain °Many things can cause abdominal pain. Usually, abdominal pain is not caused by a disease and will improve without treatment. It can often be observed and treated at home. Your health care provider will do a physical exam and possibly order blood tests and X-rays to help determine the seriousness of your pain. However, in many cases, more time must pass before a clear cause of the pain can be found. Before that point, your health care provider may not know if you need more testing or further treatment. °HOME CARE INSTRUCTIONS  °Monitor your abdominal pain for any changes. The following actions may help to alleviate any discomfort you are experiencing: °· Only take over-the-counter or prescription medicines as directed by your health care provider. °· Do not take laxatives unless directed to do so by your health care provider. °· Try a clear liquid diet (broth, tea, or water) as directed by your health care provider. Slowly move to a bland diet as tolerated. °SEEK MEDICAL CARE IF: °· You have unexplained abdominal pain. °· You have abdominal pain associated with nausea or diarrhea. °· You have pain when you urinate or have a bowel movement. °· You experience abdominal pain that wakes you in the night. °· You have abdominal pain that is worsened or improved by eating food. °· You have abdominal pain that is worsened with eating fatty foods. °· You have a fever. °SEEK IMMEDIATE MEDICAL CARE IF:  °· Your pain does not go away within 2 hours. °· You keep throwing up (vomiting). °· Your pain is felt only in portions of the abdomen, such as the right side or the left lower portion of the abdomen. °· You pass bloody or black tarry stools. °MAKE SURE YOU: °· Understand these instructions.   °· Will watch your condition.   °· Will  get help right away if you are not doing well or get worse.   °Document Released: 02/09/2005 Document Revised: 05/07/2013 Document Reviewed: 01/09/2013 °[employer]® Patient Information ©2015 [employer]. This information is not intended to replace advice given to you by your health care provider. Make sure you discuss any questions you have with your health care provider. ° °

## 2014-10-19 NOTE — ED Provider Notes (Signed)
[facility] Emergency Department Provider Note    ____________________________________________  Time seen: 2120  I have reviewed the triage vital signs and the nursing notes.   HISTORY  Chief Complaint Abdominal Pain   History limited by: Not Limited   HPI Diane Warner is a 49 y.o. female presents to the emergency department today because of continued and worsening lower back and abdominal pain. The patient was seen in the emergency department through days ago for right hip and back pain. She states since then it has gone worse and now the pain is more into her right lower quadrant and right groin. She has had nausea and vomiting. Has had some diarrhea. Patient denies any fevers. Patient denies similar pain in the past.     Past Medical History  Diagnosis Date  . Arthritis   . GERD (gastroesophageal reflux disease)   . Chronic right hip pain   . Irritable bowel syndrome (IBS)   . Asthma   . Degenerative disc disease, thoracic   . Scoliosis     There are no active problems to display for this patient.   Past Surgical History  Procedure Laterality Date  . Abdominal hysterectomy    . Tubal ligation    . Cesarean section  1985  . Cholecystectomy      Current Outpatient Rx  Name  Route  Sig  Dispense  Refill  . albuterol (VENTOLIN HFA) 108 (90 BASE) MCG/ACT inhaler   Inhalation   Inhale 2 puffs into the lungs every 6 (six) hours as needed.           . Asenapine Maleate (SAPHRIS SL)   Sublingual   Place 1 tablet under the tongue daily.           . citalopram (CELEXA) 20 MG tablet   Oral   Take 20 mg by mouth daily.           . diazepam (VALIUM) 2 MG tablet   Oral   Take 1 tablet (2 mg total) by mouth every 8 (eight) hours as needed for muscle spasms.   15 tablet   0   . dicyclomine (BENTYL) 20 MG tablet   Oral   Take 1 tablet (20 mg total) by mouth 3 (three) times daily as needed for spasms.   30 tablet   0   .  methocarbamol (ROBAXIN) 500 MG tablet      2 po tid for spasm   30 tablet   0   . naproxen (NAPROSYN) 500 MG tablet   Oral   Take 1 tablet (500 mg total) by mouth 2 (two) times daily with a meal.   14 tablet   0   . oxyCODONE-acetaminophen (ROXICET) 5-325 MG per tablet   Oral   Take 1 tablet by mouth every 4 (four) hours as needed for severe pain.   15 tablet   0   . ranitidine (ZANTAC) 150 MG tablet   Oral   Take 150 mg by mouth 2 (two) times daily.             Allergies Amoxicillin; Aspirin; Compazine; Dilaudid; Penicillins; Proton pump inhibitors; and Zofran  History reviewed. No pertinent family history.  Social History History  Substance Use Topics  . Smoking status: Current Some Day Smoker  . Smokeless tobacco: Not on file  . Alcohol Use: No    Review of Systems  Constitutional: Negative for fever. Cardiovascular: Negative for chest pain. Respiratory: Negative for shortness of  breath. Gastrointestinal: positivefor abdominal pain, vomiting and diarrhea. Genitourinary: Negative for dysuria. Musculoskeletal: Negative for back pain. Skin: Negative for rash. Neurological: Negative for headaches, focal weakness or numbness.   10-point ROS otherwise negative.  ____________________________________________   PHYSICAL EXAM:  VITAL SIGNS: ED Triage Vitals  Enc Vitals Group     BP 10/19/14 1652 145/107 mmHg     Pulse Rate 10/19/14 1652 85     Resp 10/19/14 1652 20     Temp 10/19/14 1652 98.8 F (37.1 C)     Temp Source 10/19/14 1652 Oral     SpO2 10/19/14 1652 99 %     Weight 10/19/14 1652 205 lb (92.987 kg)     Height 10/19/14 1652  (1.626 m)     Head Cir --      Peak Flow --      Pain Score 10/19/14 1653 10   Constitutional: Alert and oriented. Well appearing and in no distress. Eyes: Conjunctivae are normal. PERRL. Normal extraocular movements. ENT   Head: Normocephalic and atraumatic.   Nose: No congestion/rhinnorhea.    Mouth/Throat: Mucous membranes are moist.   Neck: No stridor. Hematological/Lymphatic/Immunilogical: No cervical lymphadenopathy. Cardiovascular: Normal rate, regular rhythm.  No murmurs, rubs, or gallops. Respiratory: Normal respiratory effort without tachypnea nor retractions. Breath sounds are clear and equal bilaterally. No wheezes/rales/rhonchi. Gastrointestinal: Soft. Minimally tender to palpation in the right lower quadrant. No rebound. No guarding. Genitourinary: Deferred Musculoskeletal: Normal range of motion in all extremities. No joint effusions.  No lower extremity tenderness nor edema. Neurologic:  Normal speech and language. No gross focal neurologic deficits are appreciated. Speech is normal.  Skin:  Skin is warm, dry and intact. No rash noted. Psychiatric: Mood and affect are normal. Speech and behavior are normal. Patient exhibits appropriate insight and judgment.  ____________________________________________    LABS (pertinent positives/negatives)  Labs Reviewed  COMPREHENSIVE METABOLIC PANEL - Abnormal; Notable for the following:    Glucose, Bld 115 (*)    Calcium 8.4 (*)    ALT 8 (*)    All other components within normal limits  CBC WITH DIFFERENTIAL/PLATELET - Abnormal; Notable for the following:    Basophils Absolute 0.2 (*)    All other components within normal limits  URINALYSIS COMPLETEWITH MICROSCOPIC ([facility] ONLY) - Abnormal; Notable for the following:    Color, Urine AMBER (*)    APPearance CLOUDY (*)    Bilirubin Urine 1+ (*)    Ketones, ur TRACE (*)    Specific Gravity, Urine 1.040 (*)    Hgb urine dipstick 1+ (*)    Protein, ur 30 (*)    Leukocytes, UA TRACE (*)    Bacteria, UA RARE (*)    Squamous Epithelial / LPF 6-30 (*)    All other components within normal limits     ____________________________________________   EKG  None  ____________________________________________    RADIOLOGY  CT renal stone IMPRESSION: Benign  appearing abdomen. ____________________________________________   PROCEDURES  Procedure(s) performed: None  Critical Care performed: No  ____________________________________________   INITIAL IMPRESSION / ASSESSMENT AND PLAN / ED COURSE  Pertinent labs & imaging results that were available during my care of the patient were reviewed by me and considered in my medical decision making (see chart for details).  Patient here with right lower quadrant pain, right flank pain.CT does have multiple blood cells in it. Will get CT stone protocol.  ----------------------------------------- 10:36 PM on 10/19/2014 -----------------------------------------  CT stone without any concerning findings. Normal appendix. Given  the normal CT scan and benign labs feel patient is safe for discharge.  ____________________________________________   FINAL CLINICAL IMPRESSION(S) / ED DIAGNOSES  Final diagnoses:  Flank pain     Phineas Semen, MD 10/19/14 2240

## 2014-10-19 NOTE — ED Notes (Signed)
Patient presents to the ED with right lower abdominal pain that began on Thursday and radiates into patient's lower back and pelvis.  Patient states pain is so severe it is causing her to feel nauseous and to vomit.  Patient reports vomiting x 1 today.  Patient reports diarrhea x 2 today.  Patient is gaurding abdomen and grimacing in triage.  rlq is tender to touch and patient states it is worse when she moves.

## 2014-10-19 NOTE — ED Notes (Signed)
Patient transported to CT via stretcher.

## 2014-10-19 NOTE — ED Notes (Signed)
Pt returned from CT °

## 2015-02-10 ENCOUNTER — Emergency Department
Admission: EM | Admit: 2015-02-10 | Discharge: 2015-02-11 | Disposition: A | Discharge status: Home / Self Care | Payer: Medicaid Other | Attending: Emergency Medicine | Admitting: Emergency Medicine

## 2015-02-10 ENCOUNTER — Encounter: Payer: Self-pay | Admitting: *Deleted

## 2015-02-10 DIAGNOSIS — Z880 Allergy status to penicillin: Secondary | ICD-10-CM | POA: Insufficient documentation

## 2015-02-10 DIAGNOSIS — Z791 Long term (current) use of non-steroidal anti-inflammatories (NSAID): Secondary | ICD-10-CM | POA: Diagnosis not present

## 2015-02-10 DIAGNOSIS — R51 Headache: Secondary | ICD-10-CM | POA: Insufficient documentation

## 2015-02-10 DIAGNOSIS — Z720 Tobacco use: Secondary | ICD-10-CM | POA: Diagnosis not present

## 2015-02-10 DIAGNOSIS — Z79899 Other long term (current) drug therapy: Secondary | ICD-10-CM | POA: Insufficient documentation

## 2015-02-10 DIAGNOSIS — R519 Headache, unspecified: Secondary | ICD-10-CM

## 2015-02-10 MED ORDER — ORPHENADRINE CITRATE 30 MG/ML IJ SOLN
60.0000 mg | INTRAMUSCULAR | Status: AC
  Administered 2015-02-10: 60 mg via INTRAVENOUS
  Filled 2015-02-10: qty 2

## 2015-02-10 MED ORDER — DIPHENHYDRAMINE HCL 25 MG PO CAPS
50.0000 mg | ORAL_CAPSULE | Freq: Once | ORAL | Status: AC
  Administered 2015-02-10: 50 mg via ORAL
  Filled 2015-02-10: qty 2

## 2015-02-10 MED ORDER — KETOROLAC TROMETHAMINE 30 MG/ML IJ SOLN
30.0000 mg | Freq: Once | INTRAMUSCULAR | Status: AC
  Administered 2015-02-10: 30 mg via INTRAVENOUS
  Filled 2015-02-10: qty 1

## 2015-02-10 MED ORDER — METOCLOPRAMIDE HCL 10 MG PO TABS: 10.0000 mg | ORAL_TABLET | Freq: Once | ORAL | Status: DC

## 2015-02-10 MED ORDER — KETOROLAC TROMETHAMINE 60 MG/2ML IM SOLN: 60.0000 mg | Freq: Once | INTRAMUSCULAR | Status: DC

## 2015-02-10 MED ORDER — ORPHENADRINE CITRATE 30 MG/ML IJ SOLN: 60.0000 mg | INTRAMUSCULAR | Status: DC

## 2015-02-10 MED ORDER — SUMATRIPTAN SUCCINATE 6 MG/0.5ML ~~LOC~~ SOLN
6.0000 mg | Freq: Once | SUBCUTANEOUS | Status: AC
  Administered 2015-02-10: 6 mg via SUBCUTANEOUS
  Filled 2015-02-10: qty 0.5

## 2015-02-10 MED ORDER — METOCLOPRAMIDE HCL 5 MG/ML IJ SOLN
10.0000 mg | Freq: Once | INTRAMUSCULAR | Status: AC
  Administered 2015-02-10: 10 mg via INTRAVENOUS
  Filled 2015-02-10: qty 2

## 2015-02-10 NOTE — ED Notes (Signed)
Pt has headaches for 2 weeks.   Pt taking tylenol without relief.  Pt has nausea.  Pt alert.  Speech clear.

## 2015-02-11 MED ORDER — METOCLOPRAMIDE HCL 5 MG PO TABS: 5.0000 mg | ORAL_TABLET | Freq: Three times a day (TID) | ORAL | Status: DC | PRN

## 2015-02-11 MED ORDER — KETOROLAC TROMETHAMINE 10 MG PO TABS: 10.0000 mg | ORAL_TABLET | Freq: Three times a day (TID) | ORAL | Status: DC

## 2015-02-11 MED ORDER — CYCLOBENZAPRINE HCL 5 MG PO TABS: 5.0000 mg | ORAL_TABLET | Freq: Three times a day (TID) | ORAL | Status: DC | PRN

## 2015-02-11 NOTE — ED Notes (Signed)
Pt in room with lights dimmed. Pt in no acute distress at this time. Pt is reporting that head still hurts but verbalized improvement and understanding of need for follow up with primary care. Pt calling mother at this time for a ride home.

## 2015-02-11 NOTE — ED Provider Notes (Signed)
[facility] Emergency Department Provider Note ____________________________________________  Time seen: 2116  I have reviewed the triage vital signs and the nursing notes.  HISTORY  Chief Complaint  Headache  HPI Diane Warner is a 49 y.o. female reports to the ED for eval lotion of headache over the last 2 days. She denies benefit with taking Tylenol over-the-counter. She gives a remote history of migraine headaches, but denies any history of medication for prevention or abortion of headache.  Past Medical History  Diagnosis Date  . Arthritis   . GERD (gastroesophageal reflux disease)   . Chronic right hip pain   . Irritable bowel syndrome (IBS)   . Asthma   . Degenerative disc disease, thoracic   . Scoliosis     There are no active problems to display for this patient.   Past Surgical History  Procedure Laterality Date  . Abdominal hysterectomy    . Tubal ligation    . Cesarean section  1985  . Cholecystectomy      Current Outpatient Rx  Name  Route  Sig  Dispense  Refill  . albuterol (VENTOLIN HFA) 108 (90 BASE) MCG/ACT inhaler   Inhalation   Inhale 2 puffs into the lungs every 6 (six) hours as needed.           . Asenapine Maleate (SAPHRIS SL)   Sublingual   Place 1 tablet under the tongue daily.           . citalopram (CELEXA) 20 MG tablet   Oral   Take 20 mg by mouth daily.           . cyclobenzaprine (FLEXERIL) 5 MG tablet   Oral   Take 1 tablet (5 mg total) by mouth every 8 (eight) hours as needed for muscle spasms.   12 tablet   0   . diazepam (VALIUM) 2 MG tablet   Oral   Take 1 tablet (2 mg total) by mouth every 8 (eight) hours as needed for muscle spasms.   15 tablet   0   . dicyclomine (BENTYL) 20 MG tablet   Oral   Take 1 tablet (20 mg total) by mouth 3 (three) times daily as needed for spasms.   30 tablet   0   . ketorolac (TORADOL) 10 MG tablet   Oral   Take 1 tablet (10 mg total) by mouth every 8  (eight) hours.   15 tablet   0   . methocarbamol (ROBAXIN) 500 MG tablet      2 po tid for spasm   30 tablet   0   . metoCLOPramide (REGLAN) 5 MG tablet   Oral   Take 1 tablet (5 mg total) by mouth every 8 (eight) hours as needed for nausea or vomiting.   12 tablet   0   . naproxen (NAPROSYN) 500 MG tablet   Oral   Take 1 tablet (500 mg total) by mouth 2 (two) times daily with a meal.   14 tablet   0   . oxyCODONE-acetaminophen (ROXICET) 5-325 MG per tablet   Oral   Take 1 tablet by mouth every 4 (four) hours as needed for severe pain.   15 tablet   0   . ranitidine (ZANTAC) 150 MG tablet   Oral   Take 150 mg by mouth 2 (two) times daily.            Allergies Amoxicillin; Aspirin; Compazine; Dilaudid; Penicillins; Proton pump inhibitors; and Zofran  No family history on file.  Social History Social History  Substance Use Topics  . Smoking status: Current Some Day Smoker  . Smokeless tobacco: None  . Alcohol Use: No   Review of Systems  Constitutional: Negative for fever. Eyes: Negative for visual changes. ENT: Negative for sore throat. Cardiovascular: Negative for chest pain. Respiratory: Negative for shortness of breath. Gastrointestinal: Negative for abdominal pain, vomiting and diarrhea. Genitourinary: Negative for dysuria. Musculoskeletal: Negative for back pain. Skin: Negative for rash. Neurological: Negative for headaches, focal weakness or numbness. ____________________________________________  PHYSICAL EXAM:  VITAL SIGNS: ED Triage Vitals  Enc Vitals Group     BP 02/10/15 2009 135/81 mmHg     Pulse Rate 02/10/15 2009 86     Resp 02/10/15 2009 22     Temp 02/10/15 2009 98.2 F (36.8 C)     Temp Source 02/10/15 2009 Oral     SpO2 02/10/15 2009 99 %     Weight 02/10/15 2009 205 lb (92.987 kg)     Height 02/10/15 2009  (1.626 m)     Head Cir --      Peak Flow --      Pain Score 02/10/15 2010 9     Pain Loc --      Pain Edu? --       Excl. in GC? --    Constitutional: Alert and oriented. Well appearing and in no distress. Eyes: Conjunctivae are normal. PERRL. Normal extraocular movements. ENT   Head: Normocephalic and atraumatic.   Nose: No congestion/rhinorrhea.   Mouth/Throat: Mucous membranes are moist.   Neck: Supple. No thyromegaly. Hematological/Lymphatic/Immunological: No cervical lymphadenopathy. Cardiovascular: Normal rate, regular rhythm.  Respiratory: Normal respiratory effort. No wheezes/rales/rhonchi. Gastrointestinal: Soft and nontender. No distention. Musculoskeletal: Nontender with normal range of motion in all extremities.  Neurologic:  Normal gait without ataxia. Normal speech and language. No gross focal neurologic deficits are appreciated. Skin:  Skin is warm, dry and intact. No rash noted. Psychiatric: Mood and affect are normal. Patient exhibits appropriate insight and judgment. ____________________________________________  PROCEDURES  Benadryl 50 mg PO Toradol 30 mg IV Norflex 60 mg IV Reglan 10 mg IV Sumatriptan 6 mg SQ ____________________________________________  INITIAL IMPRESSION / ASSESSMENT AND PLAN / ED COURSE  Acute headache likely tension in nature. Will discharge patient with Toradol, Flexeril, and Reglan tablets for acute headache pain. She will follow with a primary care provider for ongoing symptoms. Return to the ED for acutely worsening neurological symptoms. ____________________________________________  FINAL CLINICAL IMPRESSION(S) / ED DIAGNOSES  Final diagnoses:  Acute nonintractable headache, unspecified headache type      Lissa Hoard, PA-C 02/11/15 0013  Jennye Moccasin, MD 02/14/15 [phone]

## 2015-02-11 NOTE — Discharge Instructions (Signed)
General Headache Without Cause A headache is pain or discomfort felt around the head or neck area. The specific cause of a headache may not be found. There are many causes and types of headaches. A few common ones are:  Tension headaches.  Migraine headaches.  Cluster headaches.  Chronic daily headaches. HOME CARE INSTRUCTIONS   Keep all follow-up appointments with your caregiver or any specialist referral.  Only take over-the-counter or prescription medicines for pain or discomfort as directed by your caregiver.  Lie down in a dark, quiet room when you have a headache.  Keep a headache journal to find out what may trigger your migraine headaches. For example, write down:  What you eat and drink.  How much sleep you get.  Any change to your diet or medicines.  Try massage or other relaxation techniques.  Put ice packs or heat on the head and neck. Use these 3 to 4 times per day for 15 to 20 minutes each time, or as needed.  Limit stress.  Sit up straight, and do not tense your muscles.  Quit smoking if you smoke.  Limit alcohol use.  Decrease the amount of caffeine you drink, or stop drinking caffeine.  Eat and sleep on a regular schedule.  Get 7 to 9 hours of sleep, or as recommended by your caregiver.  Keep lights dim if bright lights bother you and make your headaches worse. SEEK MEDICAL CARE IF:   You have problems with the medicines you were prescribed.  Your medicines are not working.  You have a change from the usual headache.  You have nausea or vomiting. SEEK IMMEDIATE MEDICAL CARE IF:   Your headache becomes severe.  You have a fever.  You have a stiff neck.  You have loss of vision.  You have muscular weakness or loss of muscle control.  You start losing your balance or have trouble walking.  You feel faint or pass out.  You have severe symptoms that are different from your first symptoms. MAKE SURE YOU:   Understand these  instructions.  Will watch your condition.  Will get help right away if you are not doing well or get worse. Document Released: 05/02/2005 Document Revised: 07/25/2011 Document Reviewed: 05/18/2011 [facility] Patient Information 2015 [place], [place]. This information is not intended to replace advice given to you by your health care provider. Make sure you discuss any questions you have with your health care provider.  Take the prescription meds as directed for headache pain relief.  Follow-up with your provider at [facility] for ongoing management.

## 2015-02-11 NOTE — ED Notes (Signed)
Pts shown bathroom and given ice chips. Pt s ride is on the way and reports she will wait in the lobby. Pt able to ambulate independently and without difficulty.

## 2015-02-17 ENCOUNTER — Emergency Department
Admission: EM | Admit: 2015-02-17 | Discharge: 2015-02-17 | Disposition: A | Discharge status: Home / Self Care | Payer: Medicaid Other | Attending: Emergency Medicine | Admitting: Emergency Medicine

## 2015-02-17 ENCOUNTER — Encounter: Payer: Self-pay | Admitting: Emergency Medicine

## 2015-02-17 ENCOUNTER — Emergency Department: Payer: Medicaid Other

## 2015-02-17 DIAGNOSIS — R519 Headache, unspecified: Secondary | ICD-10-CM

## 2015-02-17 DIAGNOSIS — R110 Nausea: Secondary | ICD-10-CM | POA: Diagnosis not present

## 2015-02-17 DIAGNOSIS — Z880 Allergy status to penicillin: Secondary | ICD-10-CM | POA: Insufficient documentation

## 2015-02-17 DIAGNOSIS — R42 Dizziness and giddiness: Secondary | ICD-10-CM | POA: Diagnosis not present

## 2015-02-17 DIAGNOSIS — Z87891 Personal history of nicotine dependence: Secondary | ICD-10-CM | POA: Diagnosis not present

## 2015-02-17 DIAGNOSIS — R51 Headache: Secondary | ICD-10-CM | POA: Diagnosis not present

## 2015-02-17 LAB — CBC
HCT: 40.2 % (ref 35.0–47.0)
Hemoglobin: 13.3 g/dL (ref 12.0–16.0)
MCH: 29.8 pg (ref 26.0–34.0)
MCHC: 33.2 g/dL (ref 32.0–36.0)
MCV: 90 fL (ref 80.0–100.0)
PLATELETS: 256 10*3/uL (ref 150–440)
RBC: 4.46 MIL/uL (ref 3.80–5.20)
RDW: 13.6 % (ref 11.5–14.5)
WBC: 6.9 10*3/uL (ref 3.6–11.0)

## 2015-02-17 LAB — COMPREHENSIVE METABOLIC PANEL
ALT: 10 U/L — AB (ref 14–54)
AST: 22 U/L (ref 15–41)
Albumin: 3.6 g/dL (ref 3.5–5.0)
Alkaline Phosphatase: 56 U/L (ref 38–126)
Anion gap: 7 (ref 5–15)
BILIRUBIN TOTAL: 0.3 mg/dL (ref 0.3–1.2)
BUN: 12 mg/dL (ref 6–20)
CHLORIDE: 108 mmol/L (ref 101–111)
CO2: 25 mmol/L (ref 22–32)
CREATININE: 1.08 mg/dL — AB (ref 0.44–1.00)
Calcium: 8.6 mg/dL — ABNORMAL LOW (ref 8.9–10.3)
GFR, EST NON AFRICAN AMERICAN: 60 mL/min — AB (ref >60)
Glucose, Bld: 100 mg/dL — ABNORMAL HIGH (ref 65–99)
Potassium: 3.6 mmol/L (ref 3.5–5.1)
Sodium: 140 mmol/L (ref 135–145)
TOTAL PROTEIN: 6.5 g/dL (ref 6.5–8.1)

## 2015-02-17 LAB — URINALYSIS COMPLETE WITH MICROSCOPIC (ARMC ONLY)
BILIRUBIN URINE: NEGATIVE
Bacteria, UA: NONE SEEN
Glucose, UA: NEGATIVE mg/dL
HGB URINE DIPSTICK: NEGATIVE
KETONES UR: NEGATIVE mg/dL
NITRITE: NEGATIVE
PH: 6 (ref 5.0–8.0)
Protein, ur: NEGATIVE mg/dL
SPECIFIC GRAVITY, URINE: 1.02 (ref 1.005–1.030)

## 2015-02-17 MED ORDER — KETOROLAC TROMETHAMINE 30 MG/ML IJ SOLN
INTRAMUSCULAR | Status: AC
  Administered 2015-02-17: 30 mg via INTRAVENOUS
  Filled 2015-02-17: qty 1

## 2015-02-17 MED ORDER — PROMETHAZINE HCL 25 MG/ML IJ SOLN
INTRAMUSCULAR | Status: AC
Start: 2015-02-17 — End: 2015-02-17
  Administered 2015-02-17: 25 mg via INTRAVENOUS
  Filled 2015-02-17: qty 1

## 2015-02-17 MED ORDER — DIPHENHYDRAMINE HCL 50 MG/ML IJ SOLN
INTRAMUSCULAR | Status: AC
  Administered 2015-02-17: 25 mg via INTRAVENOUS
  Filled 2015-02-17: qty 1

## 2015-02-17 MED ORDER — BUTALBITAL-APAP-CAFFEINE 50-325-40 MG PO TABS: 1.0000 | ORAL_TABLET | Freq: Four times a day (QID) | ORAL | Status: AC | PRN

## 2015-02-17 MED ORDER — PROMETHAZINE HCL 25 MG/ML IJ SOLN
25.0000 mg | Freq: Once | INTRAMUSCULAR | Status: AC
  Administered 2015-02-17: 25 mg via INTRAVENOUS
  Filled 2015-02-17: qty 1

## 2015-02-17 MED ORDER — KETOROLAC TROMETHAMINE 30 MG/ML IJ SOLN
30.0000 mg | Freq: Once | INTRAMUSCULAR | Status: AC
  Administered 2015-02-17: 30 mg via INTRAVENOUS

## 2015-02-17 MED ORDER — SODIUM CHLORIDE 0.9 % IV SOLN
Freq: Once | INTRAVENOUS | Status: AC
  Administered 2015-02-17: 18:00:00 via INTRAVENOUS

## 2015-02-17 MED ORDER — DIPHENHYDRAMINE HCL 50 MG/ML IJ SOLN
25.0000 mg | Freq: Once | INTRAMUSCULAR | Status: AC
  Administered 2015-02-17: 25 mg via INTRAVENOUS

## 2015-02-17 MED ORDER — MORPHINE SULFATE (PF) 4 MG/ML IV SOLN
4.0000 mg | Freq: Once | INTRAVENOUS | Status: AC
  Administered 2015-02-17: 4 mg via INTRAVENOUS
  Filled 2015-02-17: qty 1

## 2015-02-17 NOTE — ED Notes (Signed)
Patient transported to CT 

## 2015-02-17 NOTE — ED Notes (Signed)
Patient presents to the ED with intermittent headache x 3 weeks that has been worse during the last week and a half.  Patient reports nausea and dizziness with this headache.  Patient states she was seen in ED for her headache and was prescribed Reglan, Toradol, and Flexeril for her headache and she states they "barely" help.  Patient states her headache awoke her at 4am today.

## 2015-02-17 NOTE — ED Provider Notes (Signed)
[facility] Emergency Department Provider Note     Time seen: ----------------------------------------- 5:36 PM on 02/17/2015 -----------------------------------------    I have reviewed the triage vital signs and the nursing notes.   HISTORY  Chief Complaint Headache    HPI Diane Warner is a 49 y.o. female who presents to ER for intermittent headaches for 3 weeks. Patient states his been worse during the last week and half. She does report nausea and dizziness with this headache. Patient states she was seen and prescribed Reglan Toradol and Flexeril recently but that this didn't really help. States headache woke her up at 4 AM this morning.Pain starts in the front and radiates back to the back of her head diffusely   Past Medical History  Diagnosis Date  . Arthritis   . GERD (gastroesophageal reflux disease)   . Chronic right hip pain   . Irritable bowel syndrome (IBS)   . Asthma   . Degenerative disc disease, thoracic   . Scoliosis     There are no active problems to display for this patient.   Past Surgical History  Procedure Laterality Date  . Abdominal hysterectomy    . Tubal ligation    . Cesarean section  1985  . Cholecystectomy      Allergies Amoxicillin; Aspirin; Compazine; Dilaudid; Penicillins; Proton pump inhibitors; and Zofran  Social History Social History  Substance Use Topics  . Smoking status: Former Smoker    Quit date: 02/16/2014  . Smokeless tobacco: None  . Alcohol Use: No    Review of Systems Constitutional: Negative for fever. Eyes: Negative for visual changes. ENT: Negative for sore throat. Cardiovascular: Negative for chest pain. Respiratory: Negative for shortness of breath. Gastrointestinal: Negative for abdominal pain, positive for nausea Genitourinary: Negative for dysuria. Musculoskeletal: Negative for back pain. Skin: Negative for rash. Neurological: Positive for headache and  dizziness  10-point ROS otherwise negative.  ____________________________________________   PHYSICAL EXAM:  VITAL SIGNS: ED Triage Vitals  Enc Vitals Group     BP 02/17/15 1640 130/89 mmHg     Pulse Rate 02/17/15 1640 83     Resp 02/17/15 1640 20     Temp 02/17/15 1640 98.7 F (37.1 C)     Temp Source 02/17/15 1640 Oral     SpO2 02/17/15 1640 97 %     Weight 02/17/15 1640 205 lb (92.987 kg)     Height 02/17/15 1640  (1.626 m)     Head Cir --      Peak Flow --      Pain Score 02/17/15 1640 9     Pain Loc --      Pain Edu? --      Excl. in GC? --     Constitutional: Alert and oriented. Well appearing and in no distress. Eyes: Conjunctivae are normal. PERRL. Normal extraocular movements. ENT   Head: Normocephalic and atraumatic.   Nose: No congestion/rhinnorhea.   Mouth/Throat: Mucous membranes are moist.   Neck: No stridor. Cardiovascular: Normal rate, regular rhythm. Normal and symmetric distal pulses are present in all extremities. No murmurs, rubs, or gallops. Respiratory: Normal respiratory effort without tachypnea nor retractions. Breath sounds are clear and equal bilaterally. No wheezes/rales/rhonchi. Gastrointestinal: Soft and nontender. No distention. No abdominal bruits.  Musculoskeletal: Nontender with normal range of motion in all extremities. No joint effusions.  No lower extremity tenderness nor edema. Neurologic:  Normal speech and language. No gross focal neurologic deficits are appreciated. Speech is normal. No gait  instability. Skin:  Skin is warm, dry and intact. No rash noted. Psychiatric: Mood and affect are normal. Speech and behavior are normal. Patient exhibits appropriate insight and judgment.  ____________________________________________  ED COURSE:  Pertinent labs & imaging results that were available during my care of the patient were reviewed by me and considered in my medical decision making (see chart for details). She is  in no acute distress, will receive IV fluids. Will consider CT imaging. ____________________________________________    LABS (pertinent positives/negatives)  Labs Reviewed  COMPREHENSIVE METABOLIC PANEL - Abnormal; Notable for the following:    Glucose, Bld 100 (*)    Creatinine, Ser 1.08 (*)    Calcium 8.6 (*)    ALT 10 (*)    GFR calc non Af Amer 60 (*)    All other components within normal limits  URINALYSIS COMPLETEWITH MICROSCOPIC ([facility] ONLY) - Abnormal; Notable for the following:    Color, Urine YELLOW (*)    APPearance CLOUDY (*)    Leukocytes, UA TRACE (*)    Squamous Epithelial / LPF 6-30 (*)    All other components within normal limits  CBC    RADIOLOGY Images were viewed by me  CT head FINDINGS: There is no midline shift, hydrocephalus, or mass. No acute hemorrhage or acute transcortical infarct is identified. The bony calvarium is intact. Visualized sinuses are clear.  IMPRESSION: Normal head CT. ____________________________________________  FINAL ASSESSMENT AND PLAN  Headache  Plan: Patient with labs and imaging as dictated above. Patient is feeling better after the above medications. CT is negative, labs are unremarkable. She'll be discharged with Fioricet and referred to neurology for follow-up   Emily Filbert, MD   Emily Filbert, MD 02/17/15 [phone]

## 2015-02-17 NOTE — ED Notes (Signed)
MD at bedside. 

## 2015-02-17 NOTE — Discharge Instructions (Signed)

## 2015-03-19 ENCOUNTER — Other Ambulatory Visit: Payer: Self-pay | Admitting: Nurse Practitioner

## 2015-03-19 DIAGNOSIS — M79605 Pain in left leg: Secondary | ICD-10-CM

## 2015-03-23 ENCOUNTER — Ambulatory Visit
Admission: RE | Admit: 2015-03-23 | Discharge: 2015-03-23 | Disposition: A | Discharge status: Home / Self Care | Payer: Medicaid Other | Source: Ambulatory Visit | Attending: Nurse Practitioner | Admitting: Nurse Practitioner

## 2015-03-23 ENCOUNTER — Emergency Department
Admission: EM | Admit: 2015-03-23 | Discharge: 2015-03-23 | Disposition: A | Discharge status: Home / Self Care | Payer: Medicaid Other | Attending: Emergency Medicine | Admitting: Emergency Medicine

## 2015-03-23 ENCOUNTER — Encounter: Payer: Self-pay | Admitting: Emergency Medicine

## 2015-03-23 DIAGNOSIS — Z87891 Personal history of nicotine dependence: Secondary | ICD-10-CM | POA: Insufficient documentation

## 2015-03-23 DIAGNOSIS — J069 Acute upper respiratory infection, unspecified: Secondary | ICD-10-CM | POA: Insufficient documentation

## 2015-03-23 DIAGNOSIS — Z791 Long term (current) use of non-steroidal anti-inflammatories (NSAID): Secondary | ICD-10-CM | POA: Insufficient documentation

## 2015-03-23 DIAGNOSIS — B9789 Other viral agents as the cause of diseases classified elsewhere: Secondary | ICD-10-CM

## 2015-03-23 DIAGNOSIS — M79605 Pain in left leg: Secondary | ICD-10-CM | POA: Diagnosis present

## 2015-03-23 DIAGNOSIS — Z79899 Other long term (current) drug therapy: Secondary | ICD-10-CM | POA: Insufficient documentation

## 2015-03-23 DIAGNOSIS — R509 Fever, unspecified: Secondary | ICD-10-CM | POA: Diagnosis present

## 2015-03-23 DIAGNOSIS — J45901 Unspecified asthma with (acute) exacerbation: Secondary | ICD-10-CM | POA: Insufficient documentation

## 2015-03-23 DIAGNOSIS — F329 Major depressive disorder, single episode, unspecified: Secondary | ICD-10-CM | POA: Insufficient documentation

## 2015-03-23 DIAGNOSIS — Z880 Allergy status to penicillin: Secondary | ICD-10-CM | POA: Insufficient documentation

## 2015-03-23 DIAGNOSIS — J988 Other specified respiratory disorders: Secondary | ICD-10-CM

## 2015-03-23 MED ORDER — KETOROLAC TROMETHAMINE 60 MG/2ML IM SOLN
60.0000 mg | Freq: Once | INTRAMUSCULAR | Status: AC
  Administered 2015-03-23: 60 mg via INTRAMUSCULAR
  Filled 2015-03-23: qty 2

## 2015-03-23 MED ORDER — IBUPROFEN 800 MG PO TABS: 800.0000 mg | ORAL_TABLET | Freq: Three times a day (TID) | ORAL | Status: DC | PRN

## 2015-03-23 MED ORDER — PROMETHAZINE-DM 6.25-15 MG/5ML PO SYRP: 5.0000 mL | ORAL_SOLUTION | Freq: Four times a day (QID) | ORAL | Status: DC | PRN

## 2015-03-23 NOTE — ED Notes (Signed)
Pt to ed with c/ol cough, congestion, fever and body aches,  States fever this am was 102.1,  States took tylenol at 8am today,.

## 2015-03-23 NOTE — ED Provider Notes (Signed)
[facility] Emergency Department Provider Note  ____________________________________________  Time seen: Approximately 11:45 AM  I have reviewed the triage vital signs and the nursing notes.   HISTORY  Chief Complaint Generalized Body Aches; Cough; Nasal Congestion; and Fever    HPI Diane Warner is a 49 y.o. female patient complaining of intermittent nasal congestion or rhinorrhea *yesterday. She states she has coughing spells which causes has some rib cage pain. Patient also states she is experiencing nausea vomiting diarrhea which also began yesterday. Patient states she's had fever and admits not taking a flu shot this season.   Past Medical History  Diagnosis Date  . Arthritis   . GERD (gastroesophageal reflux disease)   . Chronic right hip pain   . Irritable bowel syndrome (IBS)   . Asthma   . Degenerative disc disease, thoracic   . Scoliosis     There are no active problems to display for this patient.   Past Surgical History  Procedure Laterality Date  . Abdominal hysterectomy    . Tubal ligation    . Cesarean section  1985  . Cholecystectomy      Current Outpatient Rx  Name  Route  Sig  Dispense  Refill  . albuterol (VENTOLIN HFA) 108 (90 BASE) MCG/ACT inhaler   Inhalation   Inhale 2 puffs into the lungs every 6 (six) hours as needed.           . Asenapine Maleate (SAPHRIS SL)   Sublingual   Place 1 tablet under the tongue daily.           . butalbital-acetaminophen-caffeine (FIORICET) 50-325-40 MG tablet   Oral   Take 1-2 tablets by mouth every 6 (six) hours as needed for headache.   20 tablet   0   . citalopram (CELEXA) 20 MG tablet   Oral   Take 20 mg by mouth daily.           . cyclobenzaprine (FLEXERIL) 5 MG tablet   Oral   Take 1 tablet (5 mg total) by mouth every 8 (eight) hours as needed for muscle spasms.   12 tablet   0   . diazepam (VALIUM) 2 MG tablet   Oral   Take 1 tablet (2 mg total) by  mouth every 8 (eight) hours as needed for muscle spasms.   15 tablet   0   . dicyclomine (BENTYL) 20 MG tablet   Oral   Take 1 tablet (20 mg total) by mouth 3 (three) times daily as needed for spasms.   30 tablet   0   . ketorolac (TORADOL) 10 MG tablet   Oral   Take 1 tablet (10 mg total) by mouth every 8 (eight) hours.   15 tablet   0   . methocarbamol (ROBAXIN) 500 MG tablet      2 po tid for spasm   30 tablet   0   . metoCLOPramide (REGLAN) 5 MG tablet   Oral   Take 1 tablet (5 mg total) by mouth every 8 (eight) hours as needed for nausea or vomiting.   12 tablet   0   . naproxen (NAPROSYN) 500 MG tablet   Oral   Take 1 tablet (500 mg total) by mouth 2 (two) times daily with a meal.   14 tablet   0   . oxyCODONE-acetaminophen (ROXICET) 5-325 MG per tablet   Oral   Take 1 tablet by mouth every 4 (four) hours as needed  for severe pain.   15 tablet   0   . ranitidine (ZANTAC) 150 MG tablet   Oral   Take 150 mg by mouth 2 (two) times daily.             Allergies Amoxicillin; Aspirin; Compazine; Dilaudid; Penicillins; Proton pump inhibitors; and Zofran  History reviewed. No pertinent family history.  Social History Social History  Substance Use Topics  . Smoking status: Former Smoker    Quit date: 02/16/2014  . Smokeless tobacco: None  . Alcohol Use: No    Review of Systems Constitutional: Fever and chills Eyes: No visual changes. ENT: No sore throat. Cardiovascular: Denies chest pain. Respiratory: Mild shortness of breath. Gastrointestinal: No abdominal pain.  Nausea vomiting diarrhea.  No constipation. Genitourinary: Negative for dysuria. Musculoskeletal: Negative for back pain. Skin: Negative for rash. Neurological: Negative for headaches, but denies  focal weakness or numbness. Psychiatric:Depression Allergic/Immunilogical: Medication list 10-point ROS otherwise negative.  ____________________________________________   PHYSICAL  EXAM:  VITAL SIGNS: ED Triage Vitals  Enc Vitals Group     BP 03/23/15 1055 130/82 mmHg     Pulse Rate 03/23/15 1055 103     Resp 03/23/15 1055 20     Temp 03/23/15 1055 99.6 F (37.6 C)     Temp Source 03/23/15 1055 Oral     SpO2 03/23/15 1055 98 %     Weight 03/23/15 1055 205 lb (92.987 kg)     Height --      Head Cir --      Peak Flow --      Pain Score 03/23/15 1055 8     Pain Loc --      Pain Edu? --      Excl. in GC? --     Constitutional: Alert and oriented. Appears malaise Eyes: Conjunctivae are normal. PERRL. EOMI. Head: Atraumatic. Nose: No congestion/rhinnorhea. Mouth/Throat: Mucous membranes are moist.  Oropharynx non-erythematous. Neck: No stridor.  No cervical spine tenderness to palpation. Hematological/Lymphatic/Immunilogical: No cervical lymphadenopathy. Cardiovascular: Normal rate, regular rhythm. Grossly normal heart sounds.  Good peripheral circulation. Respiratory: Normal respiratory effort.  No retractions. Lungs CTAB. Gastrointestinal: Soft and nontender. No distention. No abdominal bruits. No CVA tenderness. Musculoskeletal: No lower extremity tenderness nor edema.  No joint effusions. Neurologic:  Normal speech and language. No gross focal neurologic deficits are appreciated. No gait instability. Skin:  Skin is warm, dry and intact. No rash noted. Psychiatric: Mood and affect are normal. Speech and behavior are normal.  ____________________________________________   LABS (all labs ordered are listed, but only abnormal results are displayed)  Labs Reviewed - No data to display ____________________________________________  EKG   ____________________________________________  RADIOLOGY   ____________________________________________   PROCEDURES  Procedure(s) performed: None  Critical Care performed: No  ____________________________________________   INITIAL IMPRESSION / ASSESSMENT AND PLAN / ED COURSE  Pertinent labs & imaging  results that were available during my care of the patient were reviewed by me and considered in my medical decision making (see chart for details).  Viral illness. Patient given prescription [facility] and ibuprofen. Discussed viral illness and nonuse of antibiotics this condition. Patient given a work note for 2 days. [facility] or return back to ER her condition worsens. ____________________________________________   FINAL CLINICAL IMPRESSION(S) / ED DIAGNOSES  Final diagnoses:  Viral respiratory illness      Joni ReiningRonald K Smith, PA-C 03/23/15 1157  Sharman CheekPhillip Stafford, MD 03/24/15 [phone]

## 2015-03-23 NOTE — Discharge Instructions (Signed)

## 2015-03-23 NOTE — ED Notes (Signed)
Pt presents with nasal congestion and rhinorrhea that started yesterday. Pt states has a non-productive cough. Pt complains of chest pain around her ribcage. States she has a "little shortness of breath". Report nausea, vomiting and diarrhea that began yesterday as well. States temperature up to 102. Denies having flu shot.

## 2015-04-20 ENCOUNTER — Emergency Department
Admission: EM | Admit: 2015-04-20 | Discharge: 2015-04-20 | Disposition: A | Discharge status: Home / Self Care | Payer: Medicaid Other | Attending: Student | Admitting: Student

## 2015-04-20 ENCOUNTER — Emergency Department: Payer: Medicaid Other

## 2015-04-20 ENCOUNTER — Encounter: Payer: Self-pay | Admitting: Emergency Medicine

## 2015-04-20 DIAGNOSIS — Z791 Long term (current) use of non-steroidal anti-inflammatories (NSAID): Secondary | ICD-10-CM | POA: Diagnosis not present

## 2015-04-20 DIAGNOSIS — Z880 Allergy status to penicillin: Secondary | ICD-10-CM | POA: Insufficient documentation

## 2015-04-20 DIAGNOSIS — J45901 Unspecified asthma with (acute) exacerbation: Secondary | ICD-10-CM | POA: Insufficient documentation

## 2015-04-20 DIAGNOSIS — J209 Acute bronchitis, unspecified: Secondary | ICD-10-CM

## 2015-04-20 DIAGNOSIS — Z79899 Other long term (current) drug therapy: Secondary | ICD-10-CM | POA: Insufficient documentation

## 2015-04-20 DIAGNOSIS — Z87891 Personal history of nicotine dependence: Secondary | ICD-10-CM | POA: Diagnosis not present

## 2015-04-20 DIAGNOSIS — R05 Cough: Secondary | ICD-10-CM | POA: Diagnosis present

## 2015-04-20 DIAGNOSIS — Z7952 Long term (current) use of systemic steroids: Secondary | ICD-10-CM | POA: Insufficient documentation

## 2015-04-20 MED ORDER — AZITHROMYCIN 250 MG PO TABS: ORAL_TABLET | ORAL | Status: DC

## 2015-04-20 MED ORDER — PREDNISONE 10 MG PO TABS: 50.0000 mg | ORAL_TABLET | Freq: Every day | ORAL | Status: DC

## 2015-04-20 MED ORDER — GUAIFENESIN-CODEINE 100-10 MG/5ML PO SOLN: 10.0000 mL | Freq: Three times a day (TID) | ORAL | Status: DC | PRN

## 2015-04-20 NOTE — ED Notes (Signed)
Cough x 1 week, productive green

## 2015-04-20 NOTE — ED Notes (Signed)
Pt in w/ complaints of cough and chest tightness since Wednesday, n/v since last night.  Pt reports decreased appetite and weight loss of 7lbs over last week.

## 2015-04-20 NOTE — Discharge Instructions (Signed)

## 2015-04-20 NOTE — ED Provider Notes (Signed)
[facility] ____________________________________________  Time seen: Approximately 5:28 PM  I have reviewed the triage vital signs and the nursing notes.   HISTORY  Chief Complaint Cough   HPI Diane Warner is a 49 y.o. female who presents to the emergency department for evaluation of cough and dyspnea. She reports having similar symptoms about a month ago. Symptoms didn't completely resolve and she has began to feel bad again over the past week. She has been taking Mucinex with mild relief of symptoms during the day. Cough is nonproductive and worse at night.    Past Medical History  Diagnosis Date  . Arthritis   . GERD (gastroesophageal reflux disease)   . Chronic right hip pain   . Irritable bowel syndrome (IBS)   . Asthma   . Degenerative disc disease, thoracic   . Scoliosis     There are no active problems to display for this patient.   Past Surgical History  Procedure Laterality Date  . Abdominal hysterectomy    . Tubal ligation    . Cesarean section  1985  . Cholecystectomy      Current Outpatient Rx  Name  Route  Sig  Dispense  Refill  . albuterol (VENTOLIN HFA) 108 (90 BASE) MCG/ACT inhaler   Inhalation   Inhale 2 puffs into the lungs every 6 (six) hours as needed.           . Asenapine Maleate (SAPHRIS SL)   Sublingual   Place 1 tablet under the tongue daily.           Marland Kitchen azithromycin (ZITHROMAX) 250 MG tablet      2 tablets today, then 1 tablet for the next 4 days.   6 each   0   . butalbital-acetaminophen-caffeine (FIORICET) 50-325-40 MG tablet   Oral   Take 1-2 tablets by mouth every 6 (six) hours as needed for headache.   20 tablet   0   . citalopram (CELEXA) 20 MG tablet   Oral   Take 20 mg by mouth daily.           . cyclobenzaprine (FLEXERIL) 5 MG tablet   Oral   Take 1 tablet (5 mg total) by mouth every 8 (eight) hours as needed for muscle spasms.   12 tablet  0   . diazepam (VALIUM) 2 MG tablet   Oral   Take 1 tablet (2 mg total) by mouth every 8 (eight) hours as needed for muscle spasms.   15 tablet   0   . dicyclomine (BENTYL) 20 MG tablet   Oral   Take 1 tablet (20 mg total) by mouth 3 (three) times daily as needed for spasms.   30 tablet   0   . guaiFENesin-codeine 100-10 MG/5ML syrup   Oral   Take 10 mLs by mouth 3 (three) times daily as needed.   120 mL   0   . ibuprofen (ADVIL,MOTRIN) 800 MG tablet   Oral   Take 1 tablet (800 mg total) by mouth every 8 (eight) hours as needed.   30 tablet   0   . ketorolac (TORADOL) 10 MG tablet   Oral   Take 1 tablet (10 mg total) by mouth every 8 (eight) hours.   15 tablet   0   . methocarbamol (ROBAXIN) 500 MG tablet      2 po tid for spasm   30 tablet   0   . metoCLOPramide (REGLAN)  5 MG tablet   Oral   Take 1 tablet (5 mg total) by mouth every 8 (eight) hours as needed for nausea or vomiting.   12 tablet   0   . naproxen (NAPROSYN) 500 MG tablet   Oral   Take 1 tablet (500 mg total) by mouth 2 (two) times daily with a meal.   14 tablet   0   . predniSONE (DELTASONE) 10 MG tablet   Oral   Take 5 tablets (50 mg total) by mouth daily.   25 tablet   0   . ranitidine (ZANTAC) 150 MG tablet   Oral   Take 150 mg by mouth 2 (two) times daily.             Allergies Amoxicillin; Aspirin; Compazine; Dilaudid; Penicillins; Proton pump inhibitors; and Zofran  No family history on file.  Social History Social History  Substance Use Topics  . Smoking status: Former Smoker    Quit date: 02/16/2014  . Smokeless tobacco: None  . Alcohol Use: No    Review of Systems Constitutional: No fever/chills Eyes: No visual changes. ENT: No sore throat. Cardiovascular: Denies chest pain. Respiratory: Occasional shortness of breath. Positive for cough. Gastrointestinal: Negative for abdominal pain. No nausea,  No vomiting.  No diarrhea.  Genitourinary: Negative for  dysuria. Musculoskeletal: Negative for body aches Skin: Negative for rash. Neurological: Negative for headaches, Negative for focal weakness or numbness.  10-point ROS otherwise negative.  ____________________________________________   PHYSICAL EXAM:  VITAL SIGNS: ED Triage Vitals  Enc Vitals Group     BP 04/20/15 1704 142/90 mmHg     Pulse Rate 04/20/15 1704 80     Resp 04/20/15 1704 20     Temp 04/20/15 1704 98.4 F (36.9 C)     Temp Source 04/20/15 1704 Oral     SpO2 04/20/15 1704 98 %     Weight 04/20/15 1704 202 lb (91.627 kg)     Height 04/20/15 1704 5\' 4"  (1.626 m)     Head Cir --      Peak Flow --      Pain Score 04/20/15 1704 8     Pain Loc --      Pain Edu? --      Excl. in GC? --     Constitutional: Alert and oriented. Well appearing and in no acute distress. Eyes: Conjunctivae are normal. PERRL. EOMI. Head: Atraumatic. Nose: No congestion/rhinnorhea. Mouth/Throat: Mucous membranes are moist.  Oropharynx mildly erythematous. Neck: No stridor.  Lymphatic: Positive for cervical lymphadenopathy. Cardiovascular: Normal rate, regular rhythm. Grossly normal heart sounds.  Good peripheral circulation. Respiratory: Normal respiratory effort.  No retractions. Lungs Clear to auscultation bilaterally. Gastrointestinal: Soft and nontender. No distention. No abdominal bruits. No CVA tenderness. Musculoskeletal: No joint pain reported. Neurologic:  Normal speech and language. No gross focal neurologic deficits are appreciated. Speech is normal. No gait instability. Skin:  Skin is warm, dry and intact. No rash noted. Psychiatric: Mood and affect are normal. Speech and behavior are normal.  ____________________________________________   LABS (all labs ordered are listed, but only abnormal results are displayed)  Labs Reviewed - No data to display ____________________________________________  EKG   ____________________________________________  RADIOLOGY  Chest  x-ray negative for acute abnormality per radiology. ____________________________________________   PROCEDURES  Procedure(s) performed: None  Critical Care performed: No  ____________________________________________   INITIAL IMPRESSION / ASSESSMENT AND PLAN / ED COURSE  Pertinent labs & imaging results that were available during my care  of the patient were reviewed by me and considered in my medical decision making (see chart for details).   Patient also states that her previous place of employment had black mold. She feels that she has had respiratory issues since she started there. She has since changed jobs, but has continued to get frequent URI's.  Today she will be prescribed azithromycin, prednisone, and Robitussin AC. She was advised to follow up with her PCP for symptoms that are not improving over the next few days. She was advised to return to the ER for symptoms that change or worsen if unable to schedule an appointment. ____________________________________________   FINAL CLINICAL IMPRESSION(S) / ED DIAGNOSES  Final diagnoses:  Acute bronchitis, unspecified organism      Chinita Pester, FNP 04/20/15 1809  Gayla Doss, MD 04/21/15 [phone]

## 2015-05-14 ENCOUNTER — Emergency Department
Admission: EM | Admit: 2015-05-14 | Discharge: 2015-05-14 | Disposition: A | Discharge status: Home / Self Care | Payer: Medicaid Other | Attending: Emergency Medicine | Admitting: Emergency Medicine

## 2015-05-14 DIAGNOSIS — M549 Dorsalgia, unspecified: Secondary | ICD-10-CM

## 2015-05-14 DIAGNOSIS — X501XXA Overexertion from prolonged static or awkward postures, initial encounter: Secondary | ICD-10-CM | POA: Diagnosis not present

## 2015-05-14 DIAGNOSIS — G8929 Other chronic pain: Secondary | ICD-10-CM | POA: Insufficient documentation

## 2015-05-14 DIAGNOSIS — Z791 Long term (current) use of non-steroidal anti-inflammatories (NSAID): Secondary | ICD-10-CM | POA: Diagnosis not present

## 2015-05-14 DIAGNOSIS — M5416 Radiculopathy, lumbar region: Secondary | ICD-10-CM | POA: Diagnosis not present

## 2015-05-14 DIAGNOSIS — Y9389 Activity, other specified: Secondary | ICD-10-CM | POA: Diagnosis not present

## 2015-05-14 DIAGNOSIS — Z87891 Personal history of nicotine dependence: Secondary | ICD-10-CM | POA: Diagnosis not present

## 2015-05-14 DIAGNOSIS — Z880 Allergy status to penicillin: Secondary | ICD-10-CM | POA: Insufficient documentation

## 2015-05-14 DIAGNOSIS — S3992XA Unspecified injury of lower back, initial encounter: Secondary | ICD-10-CM | POA: Diagnosis present

## 2015-05-14 DIAGNOSIS — M5410 Radiculopathy, site unspecified: Secondary | ICD-10-CM

## 2015-05-14 DIAGNOSIS — Z79899 Other long term (current) drug therapy: Secondary | ICD-10-CM | POA: Diagnosis not present

## 2015-05-14 DIAGNOSIS — Y998 Other external cause status: Secondary | ICD-10-CM | POA: Diagnosis not present

## 2015-05-14 DIAGNOSIS — Y9289 Other specified places as the place of occurrence of the external cause: Secondary | ICD-10-CM | POA: Insufficient documentation

## 2015-05-14 MED ORDER — TRAMADOL HCL 50 MG PO TABS
50.0000 mg | ORAL_TABLET | Freq: Four times a day (QID) | ORAL | Status: DC | PRN
Start: 2015-05-14 — End: 2015-06-05

## 2015-05-14 MED ORDER — DEXAMETHASONE SODIUM PHOSPHATE 10 MG/ML IJ SOLN
10.0000 mg | Freq: Once | INTRAMUSCULAR | Status: AC
  Administered 2015-05-14: 10 mg via INTRAMUSCULAR
  Filled 2015-05-14: qty 1

## 2015-05-14 MED ORDER — METHYLPREDNISOLONE 4 MG PO TBPK: ORAL_TABLET | ORAL | Status: DC

## 2015-05-14 NOTE — Discharge Instructions (Signed)
Advised to contact a pain medicine clinic for continual care chronic back pain.

## 2015-05-14 NOTE — ED Provider Notes (Signed)
[facility] Emergency Department Provider Note  ____________________________________________  Time seen: Approximately 9:20 PM  I have reviewed the triage vital signs and the nursing notes.   HISTORY  Chief Complaint Back Pain    HPI Diane Warner is a 49 y.o. female patient complaining of back pain secondary to a twisting incident 2 days ago. Patient has history of chronic back pain. Patient states she's pending consult to pain clinic patient stated as of radicular component to this pain to her right leg.Patient rating the pain as a 9/10. Patient denies any bladder or bowel dysfunction. No pelvis measures taken for this complaint.   Past Medical History  Diagnosis Date  . Arthritis   . GERD (gastroesophageal reflux disease)   . Chronic right hip pain   . Irritable bowel syndrome (IBS)   . Asthma   . Degenerative disc disease, thoracic   . Scoliosis     There are no active problems to display for this patient.   Past Surgical History  Procedure Laterality Date  . Abdominal hysterectomy    . Tubal ligation    . Cesarean section  1985  . Cholecystectomy      Current Outpatient Rx  Name  Route  Sig  Dispense  Refill  . albuterol (VENTOLIN HFA) 108 (90 BASE) MCG/ACT inhaler   Inhalation   Inhale 2 puffs into the lungs every 6 (six) hours as needed.           . Asenapine Maleate (SAPHRIS SL)   Sublingual   Place 1 tablet under the tongue daily.           Marland Kitchen azithromycin (ZITHROMAX) 250 MG tablet      2 tablets today, then 1 tablet for the next 4 days.   6 each   0   . butalbital-acetaminophen-caffeine (FIORICET) 50-325-40 MG tablet   Oral   Take 1-2 tablets by mouth every 6 (six) hours as needed for headache.   20 tablet   0   . citalopram (CELEXA) 20 MG tablet   Oral   Take 20 mg by mouth daily.           . cyclobenzaprine (FLEXERIL) 5 MG tablet   Oral   Take 1 tablet (5 mg total) by mouth every 8 (eight) hours as  needed for muscle spasms.   12 tablet   0   . diazepam (VALIUM) 2 MG tablet   Oral   Take 1 tablet (2 mg total) by mouth every 8 (eight) hours as needed for muscle spasms.   15 tablet   0   . dicyclomine (BENTYL) 20 MG tablet   Oral   Take 1 tablet (20 mg total) by mouth 3 (three) times daily as needed for spasms.   30 tablet   0   . guaiFENesin-codeine 100-10 MG/5ML syrup   Oral   Take 10 mLs by mouth 3 (three) times daily as needed.   120 mL   0   . ibuprofen (ADVIL,MOTRIN) 800 MG tablet   Oral   Take 1 tablet (800 mg total) by mouth every 8 (eight) hours as needed.   30 tablet   0   . ketorolac (TORADOL) 10 MG tablet   Oral   Take 1 tablet (10 mg total) by mouth every 8 (eight) hours.   15 tablet   0   . methocarbamol (ROBAXIN) 500 MG tablet      2 po tid for spasm   30 tablet  0   . methylPREDNISolone (MEDROL DOSEPAK) 4 MG TBPK tablet      Take Tapered dose as directed   21 tablet   0   . metoCLOPramide (REGLAN) 5 MG tablet   Oral   Take 1 tablet (5 mg total) by mouth every 8 (eight) hours as needed for nausea or vomiting.   12 tablet   0   . naproxen (NAPROSYN) 500 MG tablet   Oral   Take 1 tablet (500 mg total) by mouth 2 (two) times daily with a meal.   14 tablet   0   . predniSONE (DELTASONE) 10 MG tablet   Oral   Take 5 tablets (50 mg total) by mouth daily.   25 tablet   0   . ranitidine (ZANTAC) 150 MG tablet   Oral   Take 150 mg by mouth 2 (two) times daily.           . traMADol (ULTRAM) 50 MG tablet   Oral   Take 1 tablet (50 mg total) by mouth every 6 (six) hours as needed for moderate pain.   12 tablet   0     Allergies Amoxicillin; Aspirin; Compazine; Dilaudid; Penicillins; Proton pump inhibitors; and Zofran  No family history on file.  Social History Social History  Substance Use Topics  . Smoking status: Former Smoker    Quit date: 02/16/2014  . Smokeless tobacco: None  . Alcohol Use: No    Review of  Systems Constitutional: No fever/chills Eyes: No visual changes. ENT: No sore throat. Cardiovascular: Denies chest pain. Respiratory: Denies shortness of breath. Gastrointestinal: No abdominal pain.  No nausea, no vomiting.  No diarrhea.  No constipation. Genitourinary: Negative for dysuria. Musculoskeletal: Positive for chronic back pain Skin: Negative for rash. Neurological: Negative for headaches, focal weakness or numbness. -point ROS otherwise negative.  ____________________________________________   PHYSICAL EXAM:  VITAL SIGNS: ED Triage Vitals  Enc Vitals Group     BP 05/14/15 2045 156/100 mmHg     Pulse Rate 05/14/15 2045 93     Resp 05/14/15 2045 18     Temp 05/14/15 2045 98.1 F (36.7 C)     Temp Source 05/14/15 2045 Oral     SpO2 05/14/15 2045 98 %     Weight 05/14/15 2045 210 lb (95.255 kg)     Height 05/14/15 2045  (1.626 m)     Head Cir --      Peak Flow --      Pain Score 05/14/15 2046 9     Pain Loc --      Pain Edu? --      Excl. in GC? --    Constitutional: Alert and oriented. Well appearing and in no acute distress. Eyes: Conjunctivae are normal. PERRL. EOMI. Head: Atraumatic. Nose: No congestion/rhinnorhea. Mouth/Throat: Mucous membranes are moist.  Oropharynx non-erythematous. Neck: No stridor.  No cervical spine tenderness to palpation. Hematological/Lymphatic/Immunilogical: No cervical lymphadenopathy. Cardiovascular: Normal rate, regular rhythm. Grossly normal heart sounds.  Good peripheral circulation. Respiratory: Normal respiratory effort.  No retractions. Lungs CTAB. Gastrointestinal: Soft and nontender. No distention. No abdominal bruits. No CVA tenderness. Musculoskeletal: No lower extremity tenderness nor edema.  No joint effusions. Neurologic:  Normal speech and language. No gross focal neurologic deficits are appreciated. No gait instability. Skin:  Skin is warm, dry and intact. No rash noted. Psychiatric: Mood and affect are  normal. Speech and behavior are normal.  ____________________________________________   LABS (all labs ordered are listed,  but only abnormal results are displayed)  Labs Reviewed - No data to display ____________________________________________  EKG   ____________________________________________  RADIOLOGY   ____________________________________________   PROCEDURES  Procedure(s) performed: None  Critical Care performed: No  ____________________________________________   INITIAL IMPRESSION / ASSESSMENT AND PLAN / ED COURSE  Pertinent labs & imaging results that were available during my care of the patient were reviewed by me and considered in my medical decision making (see chart for details).  Radicular back pain. Patient given Decadron IM and a prescription for  Dosepak. Patient advised to follow-up with treating doctor will continue care chronic back pain. ____________________________________________   FINAL CLINICAL IMPRESSION(S) / ED DIAGNOSES  Final diagnoses:  Radicular low back pain  Chronic back pain greater than 3 months duration      Joni ReiningRonald K Adysson Revelle, PA-C 05/14/15 2132  Phineas SemenGraydon Goodman, MD 05/19/15 1249

## 2015-05-14 NOTE — ED Notes (Signed)
Patient with chronic back pain. Twisted wrong at work the other day but didn't seek medical treatment at that time. This is not worker's comp. Patient states her PCP is trying to get her in to a pain clinic but unable to get an appointment that's not months away. Able to ambulate, denies urinary symptoms.

## 2015-05-14 NOTE — ED Notes (Signed)
Assessed per PA 

## 2015-06-05 ENCOUNTER — Emergency Department
Admission: EM | Admit: 2015-06-05 | Discharge: 2015-06-06 | Disposition: A | Discharge status: Home / Self Care | Payer: Medicaid Other | Attending: Emergency Medicine | Admitting: Emergency Medicine

## 2015-06-05 ENCOUNTER — Emergency Department: Payer: Medicaid Other

## 2015-06-05 DIAGNOSIS — Z7952 Long term (current) use of systemic steroids: Secondary | ICD-10-CM | POA: Insufficient documentation

## 2015-06-05 DIAGNOSIS — Y9389 Activity, other specified: Secondary | ICD-10-CM | POA: Diagnosis not present

## 2015-06-05 DIAGNOSIS — Y9289 Other specified places as the place of occurrence of the external cause: Secondary | ICD-10-CM | POA: Insufficient documentation

## 2015-06-05 DIAGNOSIS — Z791 Long term (current) use of non-steroidal anti-inflammatories (NSAID): Secondary | ICD-10-CM | POA: Diagnosis not present

## 2015-06-05 DIAGNOSIS — Z79899 Other long term (current) drug therapy: Secondary | ICD-10-CM | POA: Insufficient documentation

## 2015-06-05 DIAGNOSIS — G8929 Other chronic pain: Secondary | ICD-10-CM | POA: Insufficient documentation

## 2015-06-05 DIAGNOSIS — Y998 Other external cause status: Secondary | ICD-10-CM | POA: Insufficient documentation

## 2015-06-05 DIAGNOSIS — Z880 Allergy status to penicillin: Secondary | ICD-10-CM | POA: Insufficient documentation

## 2015-06-05 DIAGNOSIS — S3992XA Unspecified injury of lower back, initial encounter: Secondary | ICD-10-CM | POA: Diagnosis present

## 2015-06-05 DIAGNOSIS — Z87891 Personal history of nicotine dependence: Secondary | ICD-10-CM | POA: Insufficient documentation

## 2015-06-05 DIAGNOSIS — W010XXA Fall on same level from slipping, tripping and stumbling without subsequent striking against object, initial encounter: Secondary | ICD-10-CM | POA: Diagnosis not present

## 2015-06-05 DIAGNOSIS — M5442 Lumbago with sciatica, left side: Secondary | ICD-10-CM

## 2015-06-05 MED ORDER — PREDNISONE 10 MG PO TABS: 10.0000 mg | ORAL_TABLET | Freq: Every day | ORAL | Status: DC

## 2015-06-05 MED ORDER — PREDNISONE 20 MG PO TABS
60.0000 mg | ORAL_TABLET | Freq: Once | ORAL | Status: AC
  Administered 2015-06-05: 60 mg via ORAL
  Filled 2015-06-05: qty 3

## 2015-06-05 MED ORDER — CYCLOBENZAPRINE HCL 5 MG PO TABS: 5.0000 mg | ORAL_TABLET | Freq: Three times a day (TID) | ORAL | Status: DC | PRN

## 2015-06-05 MED ORDER — KETOROLAC TROMETHAMINE 60 MG/2ML IM SOLN
60.0000 mg | Freq: Once | INTRAMUSCULAR | Status: AC
  Administered 2015-06-05: 60 mg via INTRAMUSCULAR
  Filled 2015-06-05: qty 2

## 2015-06-05 MED ORDER — CYCLOBENZAPRINE HCL 10 MG PO TABS
10.0000 mg | ORAL_TABLET | Freq: Once | ORAL | Status: AC
  Administered 2015-06-06: 10 mg via ORAL
  Filled 2015-06-05: qty 1

## 2015-06-05 MED ORDER — TRAMADOL HCL 50 MG PO TABS: 50.0000 mg | ORAL_TABLET | Freq: Four times a day (QID) | ORAL | Status: DC | PRN

## 2015-06-05 NOTE — ED Notes (Signed)
Patient reports slipped in water and fell.  Patient complains of lower back pain.

## 2015-06-05 NOTE — Discharge Instructions (Signed)
Radicular Pain °Radicular pain in either the arm or leg is usually from a bulging or herniated disk in the spine. A piece of the herniated disk may press against the nerves as the nerves exit the spine. This causes pain which is felt at the tips of the nerves down the arm or leg. Other causes of radicular pain may include: °· Fractures. °· Heart disease. °· Cancer. °· An abnormal and usually degenerative state of the nervous system or nerves (neuropathy). °Diagnosis may require CT or MRI scanning to determine the primary cause.  °Nerves that start at the neck (nerve roots) may cause radicular pain in the outer shoulder and arm. It can spread down to the thumb and fingers. The symptoms vary depending on which nerve root has been affected. In most cases radicular pain improves with conservative treatment. Neck problems may require physical therapy, a neck collar, or cervical traction. Treatment may take many weeks, and surgery may be considered if the symptoms do not improve.  °Conservative treatment is also recommended for sciatica. Sciatica causes pain to radiate from the lower back or buttock area down the leg into the foot. Often there is a history of back problems. Most patients with sciatica are better after 2 to 4 weeks of rest and other supportive care. Short term bed rest can reduce the disk pressure considerably. Sitting, however, is not a good position since this increases the pressure on the disk. You should avoid bending, lifting, and all other activities which make the problem worse. Traction can be used in severe cases. Surgery is usually reserved for patients who do not improve within the first months of treatment. °Only take over-the-counter or prescription medicines for pain, discomfort, or fever as directed by your caregiver. Narcotics and muscle relaxants may help by relieving more severe pain and spasm and by providing mild sedation. Cold or massage can give significant relief. Spinal manipulation  is not recommended. It can increase the degree of disc protrusion. Epidural steroid injections are often effective treatment for radicular pain. These injections deliver medicine to the spinal nerve in the space between the protective covering of the spinal cord and back bones (vertebrae). Your caregiver can give you more information about steroid injections. These injections are most effective when given within two weeks of the onset of pain.  °You should see your caregiver for follow up care as recommended. A program for neck and back injury rehabilitation with stretching and strengthening exercises is an important part of management.  °SEEK IMMEDIATE MEDICAL CARE IF: °· You develop increased pain, weakness, or numbness in your arm or leg. °· You develop difficulty with bladder or bowel control. °· You develop abdominal pain. °  °This information is not intended to replace advice given to you by your health care provider. Make sure you discuss any questions you have with your health care provider. °  °Document Released: 06/09/2004 Document Revised: 05/23/2014 Document Reviewed: 11/26/2014 °Elsevier Interactive Patient Education ©2016 [employer] ° °Sciatica °Sciatica is pain, weakness, numbness, or tingling along the path of the sciatic nerve. The nerve starts in the lower back and runs down the back of each leg. The nerve controls the muscles in the lower leg and in the back of the knee, while also providing sensation to the back of the thigh, lower leg, and the sole of your foot. Sciatica is a symptom of another medical condition. For instance, nerve damage or certain conditions, such as a herniated disk or bone spur on the   spine, pinch or put pressure on the sciatic nerve. This causes the pain, weakness, or other sensations normally associated with sciatica. Generally, sciatica only affects one side of the body. °CAUSES  °· Herniated or slipped disc. °· Degenerative disk disease. °· A pain disorder involving  the narrow muscle in the buttocks (piriformis syndrome). °· Pelvic injury or fracture. °· Pregnancy. °· Tumor (rare). °SYMPTOMS  °Symptoms can vary from mild to very severe. The symptoms usually travel from the low back to the buttocks and down the back of the leg. Symptoms can include: °· Mild tingling or dull aches in the lower back, leg, or hip. °· Numbness in the back of the calf or sole of the foot. °· Burning sensations in the lower back, leg, or hip. °· Sharp pains in the lower back, leg, or hip. °· Leg weakness. °· Severe back pain inhibiting movement. °These symptoms may get worse with coughing, sneezing, laughing, or prolonged sitting or standing. Also, being overweight may worsen symptoms. °DIAGNOSIS  °Your caregiver will perform a physical exam to look for common symptoms of sciatica. He or she may ask you to do certain movements or activities that would trigger sciatic nerve pain. Other tests may be performed to find the cause of the sciatica. These may include: °· Blood tests. °· X-rays. °· Imaging tests, such as an MRI or CT scan. °TREATMENT  °Treatment is directed at the cause of the sciatic pain. Sometimes, treatment is not necessary and the pain and discomfort goes away on its own. If treatment is needed, your caregiver may suggest: °· Over-the-counter medicines to relieve pain. °· Prescription medicines, such as anti-inflammatory medicine, muscle relaxants, or narcotics. °· Applying heat or ice to the painful area. °· Steroid injections to lessen pain, irritation, and inflammation around the nerve. °· Reducing activity during periods of pain. °· Exercising and stretching to strengthen your abdomen and improve flexibility of your spine. Your caregiver may suggest losing weight if the extra weight makes the back pain worse. °· Physical therapy. °· Surgery to eliminate what is pressing or pinching the nerve, such as a bone spur or part of a herniated disk. °HOME CARE INSTRUCTIONS  °· Only take  over-the-counter or prescription medicines for pain or discomfort as directed by your caregiver. °· Apply ice to the affected area for 20 minutes, 3-4 times a day for the first 48-72 hours. Then try heat in the same way. °· Exercise, stretch, or perform your usual activities if these do not aggravate your pain. °· Attend physical therapy sessions as directed by your caregiver. °· Keep all follow-up appointments as directed by your caregiver. °· Do not wear high heels or shoes that do not provide proper support. °· Check your mattress to see if it is too soft. A firm mattress may lessen your pain and discomfort. °SEEK IMMEDIATE MEDICAL CARE IF:  °· You lose control of your bowel or bladder (incontinence). °· You have increasing weakness in the lower back, pelvis, buttocks, or legs. °· You have redness or swelling of your back. °· You have a burning sensation when you urinate. °· You have pain that gets worse when you lie down or awakens you at night. °· Your pain is worse than you have experienced in the past. °· Your pain is lasting longer than 4 weeks. °· You are suddenly losing weight without reason. °MAKE SURE YOU: °· Understand these instructions. °· Will watch your condition. °· Will get help right away if you are not doing   well or get worse. °  °This information is not intended to replace advice given to you by your health care provider. Make sure you discuss any questions you have with your health care provider. °  °Document Released: 04/26/2001 Document Revised: 01/21/2015 Document Reviewed: 09/11/2011 °Elsevier Interactive Patient Education ©2016 [employer] ° °

## 2015-06-05 NOTE — ED Provider Notes (Signed)
CSN: [id number]     Arrival date & time 06/05/15  2231 History   First MD Initiated Contact with Patient 06/05/15 2248     Chief Complaint  Patient presents with  . Fall  . Back Pain     (Consider location/radiation/quality/duration/timing/severity/associated sxs/prior Treatment) HPI  50 year old female presents to the emergency department for evaluation of low back pain with left radicular symptoms. She states at approximately 3:30 PM today she slipped on water, landed on her lower back and developed pain going down her left posterior thigh to the calf. She describes the pain in the left leg is shooting with numbness and tingling. Pain is 10 out of 10. She denies any abdominal pain, head injury, neck pain. She denies any weakness or loss of bowel or bladder symptoms. After the fall, patient was ambulatory was able to go home. She decided to come in tonight due to increased pain. She has a history of degenerative disc disease and chronic lower back pain. He is not having medications for pain today. She is ambulatory with no assisted devices.   Past Medical History  Diagnosis Date  . Arthritis   . GERD (gastroesophageal reflux disease)   . Chronic right hip pain   . Irritable bowel syndrome (IBS)   . Asthma   . Degenerative disc disease, thoracic   . Scoliosis    Past Surgical History  Procedure Laterality Date  . Abdominal hysterectomy    . Tubal ligation    . Cesarean section  1985  . Cholecystectomy     No family history on file. Social History  Substance Use Topics  . Smoking status: Former Smoker    Quit date: 02/16/2014  . Smokeless tobacco: Not on file  . Alcohol Use: No   OB History    Gravida Para Term Preterm AB TAB SAB Ectopic Multiple Living   0 2 0 0 0     Review of Systems  Constitutional: Negative for fever, chills, activity change and fatigue.  HENT: Negative for congestion, sinus pressure and sore throat.   Eyes: Negative for visual  disturbance.  Respiratory: Negative for cough, chest tightness and shortness of breath.   Cardiovascular: Negative for chest pain and leg swelling.  Gastrointestinal: Negative for nausea, vomiting, abdominal pain and diarrhea.  Genitourinary: Negative for dysuria.  Musculoskeletal: Positive for back pain. Negative for arthralgias and gait problem.  Skin: Negative for rash.  Neurological: Negative for weakness, numbness and headaches.  Hematological: Negative for adenopathy.  Psychiatric/Behavioral: Negative for behavioral problems, confusion and agitation.      Allergies  Amoxicillin; Aspirin; Compazine; Dilaudid; Penicillins; Proton pump inhibitors; and Zofran  Home Medications   Prior to Admission medications   Medication Sig Start Date End Date Taking? Authorizing Provider  albuterol (VENTOLIN HFA) 108 (90 BASE) MCG/ACT inhaler Inhale 2 puffs into the lungs every 6 (six) hours as needed.      Historical Provider, MD  Asenapine Maleate (SAPHRIS SL) Place 1 tablet under the tongue daily.      Historical Provider, MD  azithromycin (ZITHROMAX) 250 MG tablet 2 tablets today, then 1 tablet for the next 4 days. 04/20/15   Chinita Pester, FNP  butalbital-acetaminophen-caffeine (FIORICET) 50-325-40 MG tablet Take 1-2 tablets by mouth every 6 (six) hours as needed for headache. 02/17/15 02/17/16  Emily Filbert, MD  citalopram (CELEXA) 20 MG tablet Take 20 mg by mouth daily.      Historical Provider, MD  cyclobenzaprine (FLEXERIL)  5 MG tablet Take 1 tablet (5 mg total) by mouth every 8 (eight) hours as needed for muscle spasms. 06/05/15   Evon Slack, PA-C  diazepam (VALIUM) 2 MG tablet Take 1 tablet (2 mg total) by mouth every 8 (eight) hours as needed for muscle spasms. 10/17/14   Irean Hong, MD  dicyclomine (BENTYL) 20 MG tablet Take 1 tablet (20 mg total) by mouth 3 (three) times daily as needed for spasms. 10/19/14 10/19/15  Phineas Semen, MD  guaiFENesin-codeine 100-10 MG/5ML syrup  Take 10 mLs by mouth 3 (three) times daily as needed. 04/20/15   Chinita Pester, FNP  ibuprofen (ADVIL,MOTRIN) 800 MG tablet Take 1 tablet (800 mg total) by mouth every 8 (eight) hours as needed. 03/23/15   Joni Reining, PA-C  ketorolac (TORADOL) 10 MG tablet Take 1 tablet (10 mg total) by mouth every 8 (eight) hours. 02/11/15   Jenise V Bacon Menshew, PA-C  methocarbamol (ROBAXIN) 500 MG tablet 2 po tid for spasm 01/19/11   Ivery Quale, PA-C  methylPREDNISolone (MEDROL DOSEPAK) 4 MG TBPK tablet Take Tapered dose as directed 05/14/15   Joni Reining, PA-C  metoCLOPramide (REGLAN) 5 MG tablet Take 1 tablet (5 mg total) by mouth every 8 (eight) hours as needed for nausea or vomiting. 02/11/15   Charlesetta Ivory Menshew, PA-C  naproxen (NAPROSYN) 500 MG tablet Take 1 tablet (500 mg total) by mouth 2 (two) times daily with a meal. 10/17/14   Irean Hong, MD  predniSONE (DELTASONE) 10 MG tablet Take 1 tablet (10 mg total) by mouth daily. 6,5,4,3,2,1 six day taper 06/05/15   Evon Slack, PA-C  ranitidine (ZANTAC) 150 MG tablet Take 150 mg by mouth 2 (two) times daily.      Historical Provider, MD  traMADol (ULTRAM) 50 MG tablet Take 1 tablet (50 mg total) by mouth every 6 (six) hours as needed. 06/05/15   Evon Slack, PA-C   BP 145/82 mmHg  Pulse 75  Temp(Src) 97.8 F (36.6 C) (Oral)  Resp 22  Ht  (1.651 m)  Wt 95.255 kg  BMI 34.95 kg/m2  SpO2 96%  LMP  (LMP Unknown) Physical Exam  Constitutional: She is oriented to person, place, and time. She appears well-developed and well-nourished. No distress.  HENT:  Head: Normocephalic and atraumatic.  Mouth/Throat: Oropharynx is clear and moist.  Eyes: EOM are normal. Pupils are equal, round, and reactive to light. Right eye exhibits no discharge. Left eye exhibits no discharge.  Neck: Normal range of motion. Neck supple.  Cardiovascular: Normal rate, regular rhythm and intact distal pulses.   Pulmonary/Chest: Effort normal and breath sounds  normal. No respiratory distress. She exhibits no tenderness.  Abdominal: Soft. She exhibits no distension. There is no tenderness.  Musculoskeletal:  Lumbar Spine: Examination of the lumbar spine reveals no bony abnormality, no edema, and no ecchymosis.  There is no step off.  The patient has decreased range of motion of the lumbar spine with flexion and extension.  The patient has normal lateral bend and rotation.  The patient has no pain with range of motion activities.  The patient has a negative axial load test, and a positive rotational Waddell test.  The patient is  tender along the spinous process at L5-S1.  The patient is non tender along the paravertebral muscles, with no muscle spasms.  The patient is non tender along the iliac crest.  The patient is non tender in the sciatic notch.  The patient is non tender along the Sacroiliac joint.  There is no Coccyx joint tenderness.    Bilateral Lower Extremities: Examination of the lower extremities reveals no bony abnormality, no edema, and no ecchymosis.  The patient has full active and passive range of motion of the hips, knees, and ankles.  There is no discomfort with range of motion exercises.  The patient is non tender along the greater trochanter region.  The patient has a negative Denna Haggard' test bilaterally.  There is normal skin warmth.  There is normal capillary refill bilaterally.    Neurologic: The patient has a painful left straight leg raise.  The patient has normal muscle strength testing for the quadriceps, calves, ankle dorsiflexion, ankle plantarflexion, and extensor hallicus longus.  The patient has sensation that is intact to light touch.  The deep tendon reflexes are nor  Neurological: She is alert and oriented to person, place, and time. She has normal reflexes.  Skin: Skin is warm and dry.  Psychiatric: She has a normal mood and affect. Her behavior is normal. Thought content normal.    ED Course  Procedures (including  critical care time) Labs Review Labs Reviewed - No data to display  Imaging Review Dg Lumbar Spine Complete  06/05/2015  CLINICAL DATA:  Slipped in water and fell today EXAM: LUMBAR SPINE - COMPLETE 4+ VIEW COMPARISON:  03/17/2014 FINDINGS: Five views of lumbar spine submitted. No acute fracture or subluxation. Mild anterior spurring upper endplate of L3 and L4 vertebral body. Mild disc space flattening at L5-S1 level. Alignment and vertebral body heights are preserved. IMPRESSION: No acute fracture or subluxation. Mild degenerative changes as described above. Electronically Signed   By: Natasha Mead M.D.   On: 06/05/2015 23:31   I have personally reviewed and evaluated these images and lab results as part of my medical decision-making.   EKG Interpretation None      MDM   Final diagnoses:  Midline low back pain with left-sided sciatica   50 year old female with fall and acute on chronic lower back pain with left-sided S1 radiculopathy. X-ray showed no evidence of acute bony abnormality. She had no weakness or neurological deficits on exam. She is given a prescription for 6 day prednisone taper, tramadol, Flexeril. Follow-up with orthopedics if no improvement. Return to the ER for any worsening symptoms or urgent changes in her health.    Evon Slack, PA-C 06/05/15 2353  Governor Rooks, MD 06/09/15 [phone]

## 2015-08-03 ENCOUNTER — Emergency Department
Admission: EM | Admit: 2015-08-03 | Discharge: 2015-08-03 | Disposition: A | Discharge status: Home / Self Care | Payer: Medicaid Other | Attending: Emergency Medicine | Admitting: Emergency Medicine

## 2015-08-03 ENCOUNTER — Encounter: Payer: Self-pay | Admitting: Emergency Medicine

## 2015-08-03 ENCOUNTER — Emergency Department: Payer: Medicaid Other

## 2015-08-03 DIAGNOSIS — Z87891 Personal history of nicotine dependence: Secondary | ICD-10-CM | POA: Diagnosis not present

## 2015-08-03 DIAGNOSIS — Z880 Allergy status to penicillin: Secondary | ICD-10-CM | POA: Insufficient documentation

## 2015-08-03 DIAGNOSIS — Z79899 Other long term (current) drug therapy: Secondary | ICD-10-CM | POA: Diagnosis not present

## 2015-08-03 DIAGNOSIS — R079 Chest pain, unspecified: Secondary | ICD-10-CM | POA: Diagnosis present

## 2015-08-03 DIAGNOSIS — Z7952 Long term (current) use of systemic steroids: Secondary | ICD-10-CM | POA: Insufficient documentation

## 2015-08-03 HISTORY — DX: Essential (primary) hypertension: I10

## 2015-08-03 LAB — BASIC METABOLIC PANEL
ANION GAP: 5 (ref 5–15)
BUN: 13 mg/dL (ref 6–20)
CO2: 25 mmol/L (ref 22–32)
Calcium: 8.8 mg/dL — ABNORMAL LOW (ref 8.9–10.3)
Chloride: 107 mmol/L (ref 101–111)
Creatinine, Ser: 0.98 mg/dL (ref 0.44–1.00)
GFR calc Af Amer: >60 mL/min (ref >60)
GFR calc non Af Amer: >60 mL/min (ref >60)
GLUCOSE: 133 mg/dL — AB (ref 65–99)
POTASSIUM: 3.6 mmol/L (ref 3.5–5.1)
Sodium: 137 mmol/L (ref 135–145)

## 2015-08-03 LAB — CBC
HEMATOCRIT: 43.8 % (ref 35.0–47.0)
HEMOGLOBIN: 14.5 g/dL (ref 12.0–16.0)
MCH: 30.3 pg (ref 26.0–34.0)
MCHC: 33.1 g/dL (ref 32.0–36.0)
MCV: 91.5 fL (ref 80.0–100.0)
Platelets: 240 10*3/uL (ref 150–440)
RBC: 4.79 MIL/uL (ref 3.80–5.20)
RDW: 14 % (ref 11.5–14.5)
WBC: 7 10*3/uL (ref 3.6–11.0)

## 2015-08-03 LAB — TROPONIN I
Troponin I: <0.03 ng/mL (ref <0.031)
Troponin I: <0.03 ng/mL (ref <0.031)

## 2015-08-03 MED ORDER — MORPHINE SULFATE (PF) 4 MG/ML IV SOLN
4.0000 mg | Freq: Once | INTRAVENOUS | Status: AC
  Administered 2015-08-03: 4 mg via INTRAVENOUS
  Filled 2015-08-03: qty 1

## 2015-08-03 MED ORDER — TRAMADOL HCL 50 MG PO TABS: 50.0000 mg | ORAL_TABLET | Freq: Four times a day (QID) | ORAL | Status: DC | PRN

## 2015-08-03 MED ORDER — IOHEXOL 350 MG/ML SOLN
75.0000 mL | Freq: Once | INTRAVENOUS | Status: AC | PRN
  Administered 2015-08-03: 75 mL via INTRAVENOUS

## 2015-08-03 MED ORDER — GI COCKTAIL ~~LOC~~
30.0000 mL | Freq: Once | ORAL | Status: AC
  Administered 2015-08-03: 30 mL via ORAL
  Filled 2015-08-03: qty 30

## 2015-08-03 MED ORDER — PROMETHAZINE HCL 25 MG/ML IJ SOLN
25.0000 mg | Freq: Once | INTRAMUSCULAR | Status: AC
  Administered 2015-08-03: 25 mg via INTRAVENOUS
  Filled 2015-08-03: qty 1

## 2015-08-03 NOTE — ED Provider Notes (Signed)
[facility] Emergency Department Provider Note  Time seen: 3:15 PM  I have reviewed the triage vital signs and the nursing notes.   HISTORY  Chief Complaint Chest Pain    HPI Diane Warner is a 50 y.o. female with a past medical history of gastric reflux, asthma, IBS, presents the emergency department with chest pain. According to the patient since Friday she has been having intermittent chest pain worse yesterday and today. States nausea, denies vomiting. States intermittent episodes of shortness of breath as well.Describes chest pain as severe, radiates to her back occasionally down both of her arms as well. Currently describes the chest discomfort is a 7/10 in the emergency department. Dull aching pain mostly in the center and lower chest. Patient states his significant history of gastric reflux for which she has tried taking Maalox and Zantac at home without relief.     Past Medical History  Diagnosis Date  . Arthritis   . GERD (gastroesophageal reflux disease)   . Chronic right hip pain   . Irritable bowel syndrome (IBS)   . Asthma   . Degenerative disc disease, thoracic   . Scoliosis     There are no active problems to display for this patient.   Past Surgical History  Procedure Laterality Date  . Abdominal hysterectomy    . Tubal ligation    . Cesarean section  1985  . Cholecystectomy      Current Outpatient Rx  Name  Route  Sig  Dispense  Refill  . albuterol (VENTOLIN HFA) 108 (90 BASE) MCG/ACT inhaler   Inhalation   Inhale 2 puffs into the lungs every 6 (six) hours as needed.           . Asenapine Maleate (SAPHRIS SL)   Sublingual   Place 1 tablet under the tongue daily.           Marland Kitchen. azithromycin (ZITHROMAX) 250 MG tablet      2 tablets today, then 1 tablet for the next 4 days.   6 each   0   . butalbital-acetaminophen-caffeine (FIORICET) 50-325-40 MG tablet   Oral   Take 1-2 tablets by mouth every 6 (six) hours as  needed for headache.   20 tablet   0   . citalopram (CELEXA) 20 MG tablet   Oral   Take 20 mg by mouth daily.           . cyclobenzaprine (FLEXERIL) 5 MG tablet   Oral   Take 1 tablet (5 mg total) by mouth every 8 (eight) hours as needed for muscle spasms.   30 tablet   1   . diazepam (VALIUM) 2 MG tablet   Oral   Take 1 tablet (2 mg total) by mouth every 8 (eight) hours as needed for muscle spasms.   15 tablet   0   . dicyclomine (BENTYL) 20 MG tablet   Oral   Take 1 tablet (20 mg total) by mouth 3 (three) times daily as needed for spasms.   30 tablet   0   . guaiFENesin-codeine 100-10 MG/5ML syrup   Oral   Take 10 mLs by mouth 3 (three) times daily as needed.   120 mL   0   . ibuprofen (ADVIL,MOTRIN) 800 MG tablet   Oral   Take 1 tablet (800 mg total) by mouth every 8 (eight) hours as needed.   30 tablet   0   . ketorolac (TORADOL) 10 MG tablet  Oral   Take 1 tablet (10 mg total) by mouth every 8 (eight) hours.   15 tablet   0   . methocarbamol (ROBAXIN) 500 MG tablet      2 po tid for spasm   30 tablet   0   . methylPREDNISolone (MEDROL DOSEPAK) 4 MG TBPK tablet      Take Tapered dose as directed   21 tablet   0   . metoCLOPramide (REGLAN) 5 MG tablet   Oral   Take 1 tablet (5 mg total) by mouth every 8 (eight) hours as needed for nausea or vomiting.   12 tablet   0   . naproxen (NAPROSYN) 500 MG tablet   Oral   Take 1 tablet (500 mg total) by mouth 2 (two) times daily with a meal.   14 tablet   0   . predniSONE (DELTASONE) 10 MG tablet   Oral   Take 1 tablet (10 mg total) by mouth daily. 6,5,4,3,2,1 six day taper   21 tablet   0   . ranitidine (ZANTAC) 150 MG tablet   Oral   Take 150 mg by mouth 2 (two) times daily.           . traMADol (ULTRAM) 50 MG tablet   Oral   Take 1 tablet (50 mg total) by mouth every 6 (six) hours as needed.   20 tablet   0     Allergies Amoxicillin; Aspirin; Compazine; Dilaudid; Penicillins;  Proton pump inhibitors; and Zofran  History reviewed. No pertinent family history.  Social History Social History  Substance Use Topics  . Smoking status: Former Smoker    Quit date: 02/16/2014  . Smokeless tobacco: None  . Alcohol Use: No    Review of Systems Constitutional: Negative for fever. Cardiovascular: Positive for chest pain Respiratory: Occasional shortness of breath denies any currently. Gastrointestinal: Negative for abdominal pain. Positive for nausea. Musculoskeletal: Negative for back pain. Neurological: Negative for headache 10-point ROS otherwise negative.  ____________________________________________   PHYSICAL EXAM:  VITAL SIGNS: ED Triage Vitals  Enc Vitals Group     BP 08/03/15 1148 128/88 mmHg     Pulse Rate 08/03/15 1148 75     Resp 08/03/15 1148 18     Temp 08/03/15 1148 97.6 F (36.4 C)     Temp Source 08/03/15 1148 Oral     SpO2 08/03/15 1148 100 %     Weight 08/03/15 1148 205 lb (92.987 kg)     Height 08/03/15 1148  (1.626 m)     Head Cir --      Peak Flow --      Pain Score 08/03/15 1148 10     Pain Loc --      Pain Edu? --      Excl. in GC? --     Constitutional: Alert and oriented. Well appearing and in no distress. Eyes: Normal exam ENT   Head: Normocephalic and atraumatic.   Mouth/Throat: Mucous membranes are moist. Cardiovascular: Normal rate, regular rhythm. No murmur Respiratory: Normal respiratory effort without tachypnea nor retractions. Breath sounds are clear. Moderate sternal tenderness to palpation. Gastrointestinal: Soft and nontender. No distention.  Musculoskeletal: Nontender with normal range of motion in all extremities. No lower extremity tenderness or edema. Neurologic:  Normal speech and language. No gross focal neurologic deficits Skin:  Skin is warm, dry and intact.  Psychiatric: Mood and affect are normal. Speech and behavior are normal.   ____________________________________________  EKG  EKG reviewed and interpreted, such as normal sinus rhythm at 75 bpm, narrow QRS, normal axis, normal intervals, no ST changes. Normal EKG.  ____________________________________________    RADIOLOGY  CT of the chest shows no acute abnormality Chest x-ray shows no acute disease. ____________________________________________    INITIAL IMPRESSION / ASSESSMENT AND PLAN / ED COURSE  Pertinent labs & imaging results that were available during my care of the patient were reviewed by me and considered in my medical decision making (see chart for details).  Patient presents with intermittent chest pain for the past 3 days. Patient describes the chest pain as a 7/10 currently. Patient states it feels like gastric reflux but did not resolve with Maalox. Patient describes the pain as radiating to her back, as well as worse with inspiration. Given the symptoms we'll proceed with a CT angiography of the chest to rule out pulmonary emboli, repeat troponin to rule out ACS. Patient does have moderate tenderness to palpation. We will dose pain medication as well as a GI cocktail and continue to monitor in the emergency department.  Repeat troponin negative. CT angiography of the chest is negative. We will discharge home with a short course of pain medication and have the patient follow-up with cardiology. Patient agreeable to plan.  ____________________________________________   FINAL CLINICAL IMPRESSION(S) / ED DIAGNOSES  Chest pain   Minna Antis, MD 08/03/15 2341

## 2015-08-03 NOTE — ED Notes (Addendum)
Pt to ed with c/o chest pain and epigastric pain that radiates into and down right arm.  Also reports sob, weakness with chest pain.

## 2015-08-03 NOTE — Discharge Instructions (Signed)
You have been seen in the emergency department today for chest pain. Your workup has shown normal results. As we discussed please follow-up with your primary care physician in the next 1-2 days for recheck. Return to the emergency department for any further chest pain, trouble breathing, or any other symptom personally concerning to yourself. °Please call the number provided for cardiology to arrange a stress test as soon as possible. ° ° °Nonspecific Chest Pain °It is often hard to find the cause of chest pain. There is always a chance that your pain could be related to something serious, such as a heart attack or a blood clot in your lungs. Chest pain can also be caused by conditions that are not life-threatening. If you have chest pain, it is very important to follow up with your doctor. ° °HOME CARE °· If you were prescribed an antibiotic medicine, finish it all even if you start to feel better. °· Avoid any activities that cause chest pain. °· Do not use any tobacco products, including cigarettes, chewing tobacco, or electronic cigarettes. If you need help quitting, ask your doctor. °· Do not drink alcohol. °· Take medicines only as told by your doctor. °· Keep all follow-up visits as told by your doctor. This is important. This includes any further testing if your chest pain does not go away. °· Your doctor may tell you to keep your head raised (elevated) while you sleep. °· Make lifestyle changes as told by your doctor. These may include: °¨ Getting regular exercise. Ask your doctor to suggest some activities that are safe for you. °¨ Eating a heart-healthy diet. Your doctor or a diet specialist (dietitian) can help you to learn healthy eating options. °¨ Maintaining a healthy weight. °¨ Managing diabetes, if necessary. °¨ Reducing stress. °GET HELP IF: °· Your chest pain does not go away, even after treatment. °· You have a rash with blisters on your chest. °· You have a fever. °GET HELP RIGHT AWAY  IF: °· Your chest pain is worse. °· You have an increasing cough, or you cough up blood. °· You have severe belly (abdominal) pain. °· You feel extremely weak. °· You pass out (faint). °· You have chills. °· You have sudden, unexplained chest discomfort. °· You have sudden, unexplained discomfort in your arms, back, neck, or jaw. °· You have shortness of breath at any time. °· You suddenly start to sweat, or your skin gets clammy. °· You feel nauseous. °· You vomit. °· You suddenly feel light-headed or dizzy. °· Your heart begins to beat quickly, or it feels like it is skipping beats. °These symptoms may be an emergency. Do not wait to see if the symptoms will go away. Get medical help right away. Call your local emergency services (911 in the [place] Do not drive yourself to the hospital. °  °This information is not intended to replace advice given to you by your health care provider. Make sure you discuss any questions you have with your health care provider. °  °Document Released: 10/19/2007 Document Revised: 05/23/2014 Document Reviewed: 12/06/2013 °Elsevier Interactive Patient Education ©2016 [employer] ° °

## 2015-08-05 ENCOUNTER — Emergency Department
Admission: EM | Admit: 2015-08-05 | Discharge: 2015-08-05 | Disposition: A | Discharge status: Home / Self Care | Payer: Medicaid Other | Attending: Emergency Medicine | Admitting: Emergency Medicine

## 2015-08-05 ENCOUNTER — Encounter: Payer: Self-pay | Admitting: Emergency Medicine

## 2015-08-05 DIAGNOSIS — M25551 Pain in right hip: Secondary | ICD-10-CM | POA: Insufficient documentation

## 2015-08-05 DIAGNOSIS — Z791 Long term (current) use of non-steroidal anti-inflammatories (NSAID): Secondary | ICD-10-CM | POA: Insufficient documentation

## 2015-08-05 DIAGNOSIS — K219 Gastro-esophageal reflux disease without esophagitis: Secondary | ICD-10-CM | POA: Diagnosis not present

## 2015-08-05 DIAGNOSIS — J45909 Unspecified asthma, uncomplicated: Secondary | ICD-10-CM | POA: Diagnosis not present

## 2015-08-05 DIAGNOSIS — M5134 Other intervertebral disc degeneration, thoracic region: Secondary | ICD-10-CM | POA: Diagnosis not present

## 2015-08-05 DIAGNOSIS — Z7952 Long term (current) use of systemic steroids: Secondary | ICD-10-CM | POA: Diagnosis not present

## 2015-08-05 DIAGNOSIS — M419 Scoliosis, unspecified: Secondary | ICD-10-CM | POA: Diagnosis not present

## 2015-08-05 DIAGNOSIS — Z87891 Personal history of nicotine dependence: Secondary | ICD-10-CM | POA: Diagnosis not present

## 2015-08-05 DIAGNOSIS — R0789 Other chest pain: Secondary | ICD-10-CM | POA: Diagnosis not present

## 2015-08-05 DIAGNOSIS — Z79899 Other long term (current) drug therapy: Secondary | ICD-10-CM | POA: Insufficient documentation

## 2015-08-05 DIAGNOSIS — K589 Irritable bowel syndrome without diarrhea: Secondary | ICD-10-CM | POA: Insufficient documentation

## 2015-08-05 DIAGNOSIS — M1990 Unspecified osteoarthritis, unspecified site: Secondary | ICD-10-CM | POA: Diagnosis not present

## 2015-08-05 DIAGNOSIS — R112 Nausea with vomiting, unspecified: Secondary | ICD-10-CM | POA: Insufficient documentation

## 2015-08-05 DIAGNOSIS — G8929 Other chronic pain: Secondary | ICD-10-CM | POA: Insufficient documentation

## 2015-08-05 DIAGNOSIS — I10 Essential (primary) hypertension: Secondary | ICD-10-CM | POA: Insufficient documentation

## 2015-08-05 LAB — BASIC METABOLIC PANEL
Anion gap: 6 (ref 5–15)
BUN: 18 mg/dL (ref 6–20)
CHLORIDE: 108 mmol/L (ref 101–111)
CO2: 23 mmol/L (ref 22–32)
CREATININE: 1.02 mg/dL — AB (ref 0.44–1.00)
Calcium: 8.5 mg/dL — ABNORMAL LOW (ref 8.9–10.3)
GFR calc non Af Amer: >60 mL/min (ref >60)
Glucose, Bld: 111 mg/dL — ABNORMAL HIGH (ref 65–99)
POTASSIUM: 3.3 mmol/L — AB (ref 3.5–5.1)
SODIUM: 137 mmol/L (ref 135–145)

## 2015-08-05 LAB — CBC
HCT: 41.2 % (ref 35.0–47.0)
Hemoglobin: 13.8 g/dL (ref 12.0–16.0)
MCH: 30.4 pg (ref 26.0–34.0)
MCHC: 33.4 g/dL (ref 32.0–36.0)
MCV: 91 fL (ref 80.0–100.0)
PLATELETS: 241 10*3/uL (ref 150–440)
RBC: 4.53 MIL/uL (ref 3.80–5.20)
RDW: 14 % (ref 11.5–14.5)
WBC: 9.7 10*3/uL (ref 3.6–11.0)

## 2015-08-05 LAB — TROPONIN I: Troponin I: <0.03 ng/mL (ref <0.031)

## 2015-08-05 MED ORDER — PROMETHAZINE HCL 12.5 MG PO TABS: 12.5000 mg | ORAL_TABLET | Freq: Four times a day (QID) | ORAL | Status: DC | PRN

## 2015-08-05 MED ORDER — PROMETHAZINE HCL 25 MG/ML IJ SOLN
25.0000 mg | Freq: Once | INTRAMUSCULAR | Status: AC
  Administered 2015-08-05: 25 mg via INTRAMUSCULAR

## 2015-08-05 MED ORDER — LORAZEPAM 1 MG PO TABS
ORAL_TABLET | ORAL | Status: AC
  Filled 2015-08-05: qty 1

## 2015-08-05 MED ORDER — PROMETHAZINE HCL 25 MG/ML IJ SOLN
INTRAMUSCULAR | Status: AC
  Administered 2015-08-05: 25 mg via INTRAMUSCULAR
  Filled 2015-08-05: qty 1

## 2015-08-05 MED ORDER — LORAZEPAM 1 MG PO TABS: 1.0000 mg | ORAL_TABLET | Freq: Once | ORAL | Status: DC

## 2015-08-05 NOTE — Discharge Instructions (Signed)
As we discussed, your workup today was reassuring.  Though we do not know exactly what is causing your symptoms, it appears that you have no emergent medical condition at this time are safe to go home and follow up as recommended in this paperwork.  Anxiety may be playing a role in your symptoms - we encourage you to discuss this with your primary care doctor, as well as following up with cardiology.  Please return immediately to the Emergency Department if you develop any new or worsening symptoms that concern you.   Nonspecific Chest Pain  Chest pain can be caused by many different conditions. There is always a chance that your pain could be related to something serious, such as a heart attack or a blood clot in your lungs. Chest pain can also be caused by conditions that are not life-threatening. If you have chest pain, it is very important to follow up with your health care provider. CAUSES  Chest pain can be caused by:  Heartburn.  Pneumonia or bronchitis.  Anxiety or stress.  Inflammation around your heart (pericarditis) or lung (pleuritis or pleurisy).  A blood clot in your lung.  A collapsed lung (pneumothorax). It can develop suddenly on its own (spontaneous pneumothorax) or from trauma to the chest.  Shingles infection (varicella-zoster virus).  Heart attack.  Damage to the bones, muscles, and cartilage that make up your chest wall. This can include:  Bruised bones due to injury.  Strained muscles or cartilage due to frequent or repeated coughing or overwork.  Fracture to one or more ribs.  Sore cartilage due to inflammation (costochondritis). RISK FACTORS  Risk factors for chest pain may include:  Activities that increase your risk for trauma or injury to your chest.  Respiratory infections or conditions that cause frequent coughing.  Medical conditions or overeating that can cause heartburn.  Heart disease or family history of heart disease.  Conditions or  health behaviors that increase your risk of developing a blood clot.  Having had chicken pox (varicella zoster). SIGNS AND SYMPTOMS Chest pain can feel like:  Burning or tingling on the surface of your chest or deep in your chest.  Crushing, pressure, aching, or squeezing pain.  Dull or sharp pain that is worse when you move, cough, or take a deep breath.  Pain that is also felt in your back, neck, shoulder, or arm, or pain that spreads to any of these areas. Your chest pain may come and go, or it may stay constant. DIAGNOSIS Lab tests or other studies may be needed to find the cause of your pain. Your health care provider may have you take a test called an ambulatory ECG (electrocardiogram). An ECG records your heartbeat patterns at the time the test is performed. You may also have other tests, such as:  Transthoracic echocardiogram (TTE). During echocardiography, sound waves are used to create a picture of all of the heart structures and to look at how blood flows through your heart.  Transesophageal echocardiogram (TEE).This is a more advanced imaging test that obtains images from inside your body. It allows your health care provider to see your heart in finer detail.  Cardiac monitoring. This allows your health care provider to monitor your heart rate and rhythm in real time.  Holter monitor. This is a portable device that records your heartbeat and can help to diagnose abnormal heartbeats. It allows your health care provider to track your heart activity for several days, if needed.  Stress tests. These  can be done through exercise or by taking medicine that makes your heart beat more quickly.  Blood tests.  Imaging tests. TREATMENT  Your treatment depends on what is causing your chest pain. Treatment may include:  Medicines. These may include:  Acid blockers for heartburn.  Anti-inflammatory medicine.  Pain medicine for inflammatory conditions.  Antibiotic medicine, if  an infection is present.  Medicines to dissolve blood clots.  Medicines to treat coronary artery disease.  Supportive care for conditions that do not require medicines. This may include:  Resting.  Applying heat or cold packs to injured areas.  Limiting activities until pain decreases. HOME CARE INSTRUCTIONS  If you were prescribed an antibiotic medicine, finish it all even if you start to feel better.  Avoid any activities that bring on chest pain.  Do not use any tobacco products, including cigarettes, chewing tobacco, or electronic cigarettes. If you need help quitting, ask your health care provider.  Do not drink alcohol.  Take medicines only as directed by your health care provider.  Keep all follow-up visits as directed by your health care provider. This is important. This includes any further testing if your chest pain does not go away.  If heartburn is the cause for your chest pain, you may be told to keep your head raised (elevated) while sleeping. This reduces the chance that acid will go from your stomach into your esophagus.  Make lifestyle changes as directed by your health care provider. These may include:  Getting regular exercise. Ask your health care provider to suggest some activities that are safe for you.  Eating a heart-healthy diet. A registered dietitian can help you to learn healthy eating options.  Maintaining a healthy weight.  Managing diabetes, if necessary.  Reducing stress. SEEK MEDICAL CARE IF:  Your chest pain does not go away after treatment.  You have a rash with blisters on your chest.  You have a fever. SEEK IMMEDIATE MEDICAL CARE IF:   Your chest pain is worse.  You have an increasing cough, or you cough up blood.  You have severe abdominal pain.  You have severe weakness.  You faint.  You have chills.  You have sudden, unexplained chest discomfort.  You have sudden, unexplained discomfort in your arms, back, neck, or  jaw.  You have shortness of breath at any time.  You suddenly start to sweat, or your skin gets clammy.  You feel nauseous or you vomit.  You suddenly feel light-headed or dizzy.  Your heart begins to beat quickly, or it feels like it is skipping beats. These symptoms may represent a serious problem that is an emergency. Do not wait to see if the symptoms will go away. Get medical help right away. Call your local emergency services (911 in the [place] Do not drive yourself to the hospital.   This information is not intended to replace advice given to you by your health care provider. Make sure you discuss any questions you have with your health care provider.   Document Released: 02/09/2005 Document Revised: 05/23/2014 Document Reviewed: 12/06/2013 Elsevier Interactive Patient Education 2016 [employer]  Nausea and Vomiting Nausea is a sick feeling that often comes before throwing up (vomiting). Vomiting is a reflex where stomach contents come out of your mouth. Vomiting can cause severe loss of body fluids (dehydration). Children and elderly adults can become dehydrated quickly, especially if they also have diarrhea. Nausea and vomiting are symptoms of a condition or disease. It is important to  find the cause of your symptoms. CAUSES   Direct irritation of the stomach lining. This irritation can result from increased acid production (gastroesophageal reflux disease), infection, food poisoning, taking certain medicines (such as nonsteroidal anti-inflammatory drugs), alcohol use, or tobacco use.  Signals from the brain.These signals could be caused by a headache, heat exposure, an inner ear disturbance, increased pressure in the brain from injury, infection, a tumor, or a concussion, pain, emotional stimulus, or metabolic problems.  An obstruction in the gastrointestinal tract (bowel obstruction).  Illnesses such as diabetes, hepatitis, gallbladder problems, appendicitis, kidney  problems, cancer, sepsis, atypical symptoms of a heart attack, or eating disorders.  Medical treatments such as chemotherapy and radiation.  Receiving medicine that makes you sleep (general anesthetic) during surgery. DIAGNOSIS Your caregiver may ask for tests to be done if the problems do not improve after a few days. Tests may also be done if symptoms are severe or if the reason for the nausea and vomiting is not clear. Tests may include:  Urine tests.  Blood tests.  Stool tests.  Cultures (to look for evidence of infection).  X-rays or other imaging studies. Test results can help your caregiver make decisions about treatment or the need for additional tests. TREATMENT You need to stay well hydrated. Drink frequently but in small amounts.You may wish to drink water, sports drinks, clear broth, or eat frozen ice pops or gelatin dessert to help stay hydrated.When you eat, eating slowly may help prevent nausea.There are also some antinausea medicines that may help prevent nausea. HOME CARE INSTRUCTIONS   Take all medicine as directed by your caregiver.  If you do not have an appetite, do not force yourself to eat. However, you must continue to drink fluids.  If you have an appetite, eat a normal diet unless your caregiver tells you differently.  Eat a variety of complex carbohydrates (rice, wheat, potatoes, bread), lean meats, yogurt, fruits, and vegetables.  Avoid high-fat foods because they are more difficult to digest.  Drink enough water and fluids to keep your urine clear or pale yellow.  If you are dehydrated, ask your caregiver for specific rehydration instructions. Signs of dehydration may include:  Severe thirst.  Dry lips and mouth.  Dizziness.  Dark urine.  Decreasing urine frequency and amount.  Confusion.  Rapid breathing or pulse. SEEK IMMEDIATE MEDICAL CARE IF:   You have blood or brown flecks (like coffee grounds) in your vomit.  You have black  or bloody stools.  You have a severe headache or stiff neck.  You are confused.  You have severe abdominal pain.  You have chest pain or trouble breathing.  You do not urinate at least once every 8 hours.  You develop cold or clammy skin.  You continue to vomit for longer than 24 to 48 hours.  You have a fever. MAKE SURE YOU:   Understand these instructions.  Will watch your condition.  Will get help right away if you are not doing well or get worse.   This information is not intended to replace advice given to you by your health care provider. Make sure you discuss any questions you have with your health care provider.   Document Released: 05/02/2005 Document Revised: 07/25/2011 Document Reviewed: 09/29/2010 Elsevier Interactive Patient Education [employer]

## 2015-08-05 NOTE — ED Notes (Addendum)
Pt c/o left sided chest pressure/pain radiating to right arm and headache since 3/17. Pt reports was seen here on Monday for same symptoms, was referred to cardiologist. Pt prescribed tramadol, states took tramadol and ibuprofen at home PTA without relief. Pt denies abdominal pain and fever at home. No increased work in breathing noted.

## 2015-08-05 NOTE — ED Provider Notes (Signed)
[facility] Emergency Department Provider Note  ____________________________________________  Time seen: Approximately 10:34 PM  I have reviewed the triage vital signs and the nursing notes.   HISTORY  Chief Complaint Chest Pain    HPI Diane Warner is a 50 y.o. female with an extensive history of chronic back pain and radiculopathyin addition to irritable bowel syndrome, chronic right hip pain, scoliosis, and degenerative disc disease who presents with about 5 days of persistent chest pain.  She says that it is intermittent and that it is a dull aching pain that radiates down both of her arms.  She was seen 2 days ago and was evaluated extensively with 2 troponins and a CT angiogram of her chest to rule out pulmonary embolism.  This workup was all reassuring.  She was referred to Dr. Welton Flakes with cardiology for further evaluation.  She reports that Dr. Milta Deiters office contacted her primary care doctor at the [facility] clinic for referral.  When the patient spoke with a nurse at the [facility] clinic and she told the nurse that she was still having pain she was told to come back to the emergency department.  She reports to me that the symptoms are severe as they were before and that she is having tingling in both hands as well as both feet.  She is also having some nausea and vomiting.  Overall she describes symptoms as severe and nothing is helping.   Past Medical History  Diagnosis Date  . Arthritis   . GERD (gastroesophageal reflux disease)   . Chronic right hip pain   . Irritable bowel syndrome (IBS)   . Asthma   . Degenerative disc disease, thoracic   . Scoliosis   . Hypertension     There are no active problems to display for this patient.   Past Surgical History  Procedure Laterality Date  . Abdominal hysterectomy    . Tubal ligation    . Cesarean section  1985  . Cholecystectomy      Current Outpatient Rx  Name  Route  Sig  Dispense  Refill  .  albuterol (VENTOLIN HFA) 108 (90 BASE) MCG/ACT inhaler   Inhalation   Inhale 2 puffs into the lungs every 6 (six) hours as needed.           . Asenapine Maleate (SAPHRIS SL)   Sublingual   Place 1 tablet under the tongue daily.           Marland Kitchen azithromycin (ZITHROMAX) 250 MG tablet      2 tablets today, then 1 tablet for the next 4 days.   6 each   0   . butalbital-acetaminophen-caffeine (FIORICET) 50-325-40 MG tablet   Oral   Take 1-2 tablets by mouth every 6 (six) hours as needed for headache.   20 tablet   0   . citalopram (CELEXA) 20 MG tablet   Oral   Take 20 mg by mouth daily.           . cyclobenzaprine (FLEXERIL) 5 MG tablet   Oral   Take 1 tablet (5 mg total) by mouth every 8 (eight) hours as needed for muscle spasms.   30 tablet   1   . diazepam (VALIUM) 2 MG tablet   Oral   Take 1 tablet (2 mg total) by mouth every 8 (eight) hours as needed for muscle spasms.   15 tablet   0   . dicyclomine (BENTYL) 20 MG tablet   Oral  Take 1 tablet (20 mg total) by mouth 3 (three) times daily as needed for spasms.   30 tablet   0   . guaiFENesin-codeine 100-10 MG/5ML syrup   Oral   Take 10 mLs by mouth 3 (three) times daily as needed.   120 mL   0   . ibuprofen (ADVIL,MOTRIN) 800 MG tablet   Oral   Take 1 tablet (800 mg total) by mouth every 8 (eight) hours as needed.   30 tablet   0   . ketorolac (TORADOL) 10 MG tablet   Oral   Take 1 tablet (10 mg total) by mouth every 8 (eight) hours.   15 tablet   0   . methocarbamol (ROBAXIN) 500 MG tablet      2 po tid for spasm   30 tablet   0   . methylPREDNISolone (MEDROL DOSEPAK) 4 MG TBPK tablet      Take Tapered dose as directed   21 tablet   0   . metoCLOPramide (REGLAN) 5 MG tablet   Oral   Take 1 tablet (5 mg total) by mouth every 8 (eight) hours as needed for nausea or vomiting.   12 tablet   0   . naproxen (NAPROSYN) 500 MG tablet   Oral   Take 1 tablet (500 mg total) by mouth 2 (two)  times daily with a meal.   14 tablet   0   . predniSONE (DELTASONE) 10 MG tablet   Oral   Take 1 tablet (10 mg total) by mouth daily. 6,5,4,3,2,1 six day taper   21 tablet   0   . promethazine (PHENERGAN) 12.5 MG tablet   Oral   Take 1 tablet (12.5 mg total) by mouth every 6 (six) hours as needed for nausea or vomiting.   30 tablet   0   . ranitidine (ZANTAC) 150 MG tablet   Oral   Take 150 mg by mouth 2 (two) times daily.           . traMADol (ULTRAM) 50 MG tablet   Oral   Take 1 tablet (50 mg total) by mouth every 6 (six) hours as needed.   10 tablet   0     Allergies Amoxicillin; Aspirin; Compazine; Dilaudid; Penicillins; Proton pump inhibitors; and Zofran  No family history on file.  Social History Social History  Substance Use Topics  . Smoking status: Former Smoker    Quit date: 02/16/2014  . Smokeless tobacco: None  . Alcohol Use: No    Review of Systems Constitutional: No fever/chills Eyes: No visual changes. ENT: No sore throat. Cardiovascular: Chest pain that radiates down both arms Respiratory: Denies shortness of breath. Gastrointestinal: Some nausea and vomiting.  No diarrhea, no constipation Genitourinary: Negative for dysuria. Musculoskeletal: Negative for back pain. Skin: Negative for rash. Neurological: Negative for headaches, focal weakness or numbness.  10-point ROS otherwise negative.  ____________________________________________   PHYSICAL EXAM:  VITAL SIGNS: ED Triage Vitals  Enc Vitals Group     BP 08/05/15 2042 140/74 mmHg     Pulse Rate 08/05/15 2042 85     Resp 08/05/15 2042 16     Temp 08/05/15 2042 97.7 F (36.5 C)     Temp Source 08/05/15 2042 Oral     SpO2 08/05/15 2042 96 %     Weight 08/05/15 2042 212 lb (96.163 kg)     Height 08/05/15 2042  (1.626 m)     Head Cir --  Peak Flow --      Pain Score 08/05/15 2048 10     Pain Loc --      Pain Edu? --      Excl. in GC? --     Constitutional: Alert  and oriented. Well appearing and in no acute distress though she does appear uncomfortable. Eyes: Conjunctivae are normal. PERRL. EOMI. Head: Atraumatic. Nose: No congestion/rhinnorhea. Mouth/Throat: Mucous membranes are moist.  Oropharynx non-erythematous. Neck: No stridor.  No meningeal signs.   Cardiovascular: Normal rate, regular rhythm. Good peripheral circulation. Grossly normal heart sounds.  No reproducible chest wall tenderness. Respiratory: Normal respiratory effort.  No retractions. Lungs CTAB. Gastrointestinal: Soft and nontender. No distention.  Musculoskeletal: No lower extremity tenderness nor edema. No gross deformities of extremities. Neurologic:  Normal speech and language. No gross focal neurologic deficits are appreciated.  Skin:  Skin is warm, dry and intact. No rash noted.   ____________________________________________   LABS (all labs ordered are listed, but only abnormal results are displayed)  Labs Reviewed  BASIC METABOLIC PANEL - Abnormal; Notable for the following:    Potassium 3.3 (*)    Glucose, Bld 111 (*)    Creatinine, Ser 1.02 (*)    Calcium 8.5 (*)    All other components within normal limits  CBC  TROPONIN I   ____________________________________________  EKG  ED ECG REPORT I, Derrius Furtick, the attending physician, personally viewed and interpreted this ECG.  Date: 08/05/2015 EKG Time: 20:46 Rate: 85 Rhythm: normal sinus rhythm QRS Axis: normal Intervals: normal ST/T Wave abnormalities: normal Conduction Disturbances: none Narrative Interpretation: unremarkable  ____________________________________________  RADIOLOGY  (Imaging obtained two days ago)  Dg Chest 2 View  08/03/2015  CLINICAL DATA:  Chest pain EXAM: CHEST  2 VIEW COMPARISON:  04/20/2015 FINDINGS: The heart size and mediastinal contours are within normal limits. Both lungs are clear. The visualized skeletal structures are unremarkable. IMPRESSION: No active  cardiopulmonary disease. Electronically Signed   By: Marlan Palau M.D.   On: 08/03/2015 13:36   Ct Angio Chest Pe W/cm &/or Wo Cm  08/03/2015  CLINICAL DATA:  Pleuritic chest pain EXAM: CT ANGIOGRAPHY CHEST WITH CONTRAST TECHNIQUE: Multidetector CT imaging of the chest was performed using the standard protocol during bolus administration of intravenous contrast. Multiplanar CT image reconstructions and MIPs were obtained to evaluate the vascular anatomy. CONTRAST:  75mL OMNIPAQUE IOHEXOL 350 MG/ML SOLN COMPARISON:  Chest x-ray 08/03/2015 FINDINGS: Negative for pulmonary embolism. Satisfactory pulmonary artery enhancement. Pulmonary arteries normal in caliber. Negative for aortic aneurysm or dissection. Heart size normal. No pericardial effusion. No coronary calcification Mild right middle lobe atelectasis. Negative for pneumonia. No pleural effusion. Negative for mass or adenopathy. No acute skeletal abnormality. Review of the MIP images confirms the above findings. IMPRESSION: Negative for pulmonary embolism. No acute abnormality in the chest. Mild right middle lobe atelectasis. Electronically Signed   By: Marlan Palau M.D.   On: 08/03/2015 16:18     ____________________________________________   PROCEDURES  Procedure(s) performed: None  Critical Care performed: No ____________________________________________   INITIAL IMPRESSION / ASSESSMENT AND PLAN / ED COURSE  Pertinent labs & imaging results that were available during my care of the patient were reviewed by me and considered in my medical decision making (see chart for details).  The patient complains of persistent pain but she once again has a reassuring exam and work up.  I reviewed the results of her prior CT scan which was negative for PE as well as  for aortic dissection.  There is no evidence of pneumonia.  Her labs remained normal except for very mild hypokalemia.  There is nothing to explain her symptoms right now and based on  the description of her pain as well as the tingling in her extremities I wonder if anxiety is playing a component.  She received Phenergan 25 mg IM while in the emergency department for nausea and vomiting.  I do not want to prescribe additional narcotics for her given the chronic pain issues she has had in the past and she did receive a prescription for tramadol.  This appears to be an outpatient follow-up issue.  There is no benefit to her to coming into the hospital.  We discussed all this and she agreed that anxiety may play a role.  I explained that I will defer anxiolytics to her PCP, but the phenergan may help both with nausea and the anxiety.    I gave my usual and customary return precautions.     ____________________________________________  FINAL CLINICAL IMPRESSION(S) / ED DIAGNOSES  Final diagnoses:  Atypical chest pain  Non-intractable vomiting with nausea, vomiting of unspecified type      NEW MEDICATIONS STARTED DURING THIS VISIT:  New Prescriptions   PROMETHAZINE (PHENERGAN) 12.5 MG TABLET    Take 1 tablet (12.5 mg total) by mouth every 6 (six) hours as needed for nausea or vomiting.      Note:  This document was prepared using Dragon voice recognition software and may include unintentional dictation errors.   Loleta Roseory Markia Kyer, MD 08/05/15 2258

## 2015-08-05 NOTE — ED Notes (Signed)
States was seen in ED on Monday for complaints of right sided chest pain, radiating to right shoulder, through to back and down right arm.  Also c/o headache and tingling to both hands and all fingers.. Referred to heart specialist and called by PCP today who sent patient back to ED for evaluation due to worsening symptoms.

## 2015-09-20 ENCOUNTER — Emergency Department: Payer: Medicaid Other

## 2015-09-20 ENCOUNTER — Emergency Department
Admission: EM | Admit: 2015-09-20 | Discharge: 2015-09-20 | Disposition: A | Discharge status: Home / Self Care | Payer: Medicaid Other | Attending: Emergency Medicine | Admitting: Emergency Medicine

## 2015-09-20 ENCOUNTER — Encounter: Payer: Self-pay | Admitting: *Deleted

## 2015-09-20 DIAGNOSIS — Z79899 Other long term (current) drug therapy: Secondary | ICD-10-CM | POA: Insufficient documentation

## 2015-09-20 DIAGNOSIS — R0789 Other chest pain: Secondary | ICD-10-CM | POA: Diagnosis present

## 2015-09-20 DIAGNOSIS — I10 Essential (primary) hypertension: Secondary | ICD-10-CM | POA: Insufficient documentation

## 2015-09-20 DIAGNOSIS — J45909 Unspecified asthma, uncomplicated: Secondary | ICD-10-CM | POA: Diagnosis not present

## 2015-09-20 DIAGNOSIS — M1990 Unspecified osteoarthritis, unspecified site: Secondary | ICD-10-CM | POA: Insufficient documentation

## 2015-09-20 DIAGNOSIS — R079 Chest pain, unspecified: Secondary | ICD-10-CM

## 2015-09-20 DIAGNOSIS — M5134 Other intervertebral disc degeneration, thoracic region: Secondary | ICD-10-CM | POA: Diagnosis not present

## 2015-09-20 DIAGNOSIS — Z87891 Personal history of nicotine dependence: Secondary | ICD-10-CM | POA: Diagnosis not present

## 2015-09-20 DIAGNOSIS — Z791 Long term (current) use of non-steroidal anti-inflammatories (NSAID): Secondary | ICD-10-CM | POA: Diagnosis not present

## 2015-09-20 HISTORY — DX: Anxiety disorder, unspecified: F41.9

## 2015-09-20 LAB — CBC
HEMATOCRIT: 42.5 % (ref 35.0–47.0)
HEMOGLOBIN: 14.3 g/dL (ref 12.0–16.0)
MCH: 30.6 pg (ref 26.0–34.0)
MCHC: 33.7 g/dL (ref 32.0–36.0)
MCV: 90.8 fL (ref 80.0–100.0)
Platelets: 246 10*3/uL (ref 150–440)
RBC: 4.68 MIL/uL (ref 3.80–5.20)
RDW: 13.5 % (ref 11.5–14.5)
WBC: 9.3 10*3/uL (ref 3.6–11.0)

## 2015-09-20 LAB — TROPONIN I

## 2015-09-20 LAB — BASIC METABOLIC PANEL
Anion gap: 9 (ref 5–15)
BUN: 13 mg/dL (ref 6–20)
CHLORIDE: 105 mmol/L (ref 101–111)
CO2: 25 mmol/L (ref 22–32)
CREATININE: 0.87 mg/dL (ref 0.44–1.00)
Calcium: 8.7 mg/dL — ABNORMAL LOW (ref 8.9–10.3)
GFR calc non Af Amer: >60 mL/min (ref >60)
Glucose, Bld: 101 mg/dL — ABNORMAL HIGH (ref 65–99)
POTASSIUM: 3.9 mmol/L (ref 3.5–5.1)
SODIUM: 139 mmol/L (ref 135–145)

## 2015-09-20 LAB — URINALYSIS COMPLETE WITH MICROSCOPIC (ARMC ONLY)
Bilirubin Urine: NEGATIVE
Glucose, UA: NEGATIVE mg/dL
Ketones, ur: NEGATIVE mg/dL
Nitrite: NEGATIVE
PH: 6 (ref 5.0–8.0)
Protein, ur: 30 mg/dL — AB
Specific Gravity, Urine: 1.024 (ref 1.005–1.030)

## 2015-09-20 LAB — LIPASE, BLOOD: Lipase: 32 U/L (ref 11–51)

## 2015-09-20 LAB — PREGNANCY, URINE: PREG TEST UR: NEGATIVE

## 2015-09-20 MED ORDER — IOPAMIDOL (ISOVUE-370) INJECTION 76%
75.0000 mL | Freq: Once | INTRAVENOUS | Status: AC | PRN
  Administered 2015-09-20: 75 mL via INTRAVENOUS

## 2015-09-20 MED ORDER — PROMETHAZINE HCL 25 MG/ML IJ SOLN
12.5000 mg | Freq: Once | INTRAMUSCULAR | Status: AC
  Administered 2015-09-20: 12.5 mg via INTRAVENOUS
  Filled 2015-09-20: qty 1

## 2015-09-20 MED ORDER — TRAMADOL HCL 50 MG PO TABS: 50.0000 mg | ORAL_TABLET | Freq: Two times a day (BID) | ORAL | Status: DC | PRN

## 2015-09-20 MED ORDER — PROPRANOLOL HCL 40 MG PO TABS: 40.0000 mg | ORAL_TABLET | Freq: Two times a day (BID) | ORAL | Status: DC

## 2015-09-20 MED ORDER — TRAMADOL HCL 50 MG PO TABS
50.0000 mg | ORAL_TABLET | Freq: Once | ORAL | Status: AC
  Administered 2015-09-20: 50 mg via ORAL
  Filled 2015-09-20: qty 1

## 2015-09-20 MED ORDER — MORPHINE SULFATE (PF) 4 MG/ML IV SOLN: 4.0000 mg | Freq: Once | INTRAVENOUS | Status: DC

## 2015-09-20 NOTE — ED Notes (Signed)
Pt discharged to home.  Family member driving.  Discharge instructions reviewed.  Verbalized understanding.  No questions or concerns at this time.  Teach back verified.  Pt in NAD.  No items left in ED.   

## 2015-09-20 NOTE — ED Notes (Addendum)
Pt woke at 0400 w/ chest pain, radiating to L jaw. Pt states the pain has progressed to L chest, L arm, L ribs, and back. Pt c/o nausea when waking with pain, vomiting x 1. Pt took Aleve w/o relief. Pt c/o diaphoresis, chills and rigors throughout day. Pt states echocardiogram and stress test x 2 wks ago and saw cardiologist regarding these results last week. Pt did not drive self to ED today. Pt states she is out of her BP meds x 3 months.

## 2015-09-20 NOTE — Discharge Instructions (Signed)
Nonspecific Chest Pain °It is often hard to find the cause of chest pain. There is always a chance that your pain could be related to something serious, such as a heart attack or a blood clot in your lungs. Chest pain can also be caused by conditions that are not life-threatening. If you have chest pain, it is very important to follow up with your doctor. ° °HOME CARE °· If you were prescribed an antibiotic medicine, finish it all even if you start to feel better. °· Avoid any activities that cause chest pain. °· Do not use any tobacco products, including cigarettes, chewing tobacco, or electronic cigarettes. If you need help quitting, ask your doctor. °· Do not drink alcohol. °· Take medicines only as told by your doctor. °· Keep all follow-up visits as told by your doctor. This is important. This includes any further testing if your chest pain does not go away. °· Your doctor may tell you to keep your head raised (elevated) while you sleep. °· Make lifestyle changes as told by your doctor. These may include: °¨ Getting regular exercise. Ask your doctor to suggest some activities that are safe for you. °¨ Eating a heart-healthy diet. Your doctor or a diet specialist (dietitian) can help you to learn healthy eating options. °¨ Maintaining a healthy weight. °¨ Managing diabetes, if necessary. °¨ Reducing stress. °GET HELP IF: °· Your chest pain does not go away, even after treatment. °· You have a rash with blisters on your chest. °· You have a fever. °GET HELP RIGHT AWAY IF: °· Your chest pain is worse. °· You have an increasing cough, or you cough up blood. °· You have severe belly (abdominal) pain. °· You feel extremely weak. °· You pass out (faint). °· You have chills. °· You have sudden, unexplained chest discomfort. °· You have sudden, unexplained discomfort in your arms, back, neck, or jaw. °· You have shortness of breath at any time. °· You suddenly start to sweat, or your skin gets clammy. °· You feel  nauseous. °· You vomit. °· You suddenly feel light-headed or dizzy. °· Your heart begins to beat quickly, or it feels like it is skipping beats. °These symptoms may be an emergency. Do not wait to see if the symptoms will go away. Get medical help right away. Call your local emergency services (911 in the [place] Do not drive yourself to the hospital. °  °This information is not intended to replace advice given to you by your health care provider. Make sure you discuss any questions you have with your health care provider. °  °Document Released: 10/19/2007 Document Revised: 05/23/2014 Document Reviewed: 12/06/2013 °Elsevier Interactive Patient Education ©2016 [employer] ° °Please return immediately if condition worsens. Please contact her primary physician or the physician you were given for referral. If you have any specialist physicians involved in her treatment and plan please also contact them. Thank you for using [facility]. ° °

## 2015-09-20 NOTE — ED Provider Notes (Signed)
Time Seen: Approximately 2040 I have reviewed the triage notes  Chief Complaint: Chest Pain   History of Present Illness: Diane Warner is a 50 y.o. female who presents with a recent history of 2 weeks of almost continuous chest discomfort which is currently under evaluation by her cardiologist. Patient states she just saw a cardiologist on the end of last week and thus far her studies have been negative. There was some suggestion that they may want to perform another study. She states that she's had an echocardiogram and a stress test 2 weeks ago. On further questioning she describes this pain going on even for a longer period of time. She states the pain radiates to her back and up into her left arm and ribs. She denies any fever or productive cough. She denies any pleuritic or positional component. She was on state that she's been out of her blood pressure medication now for "" a couple months "" she states she did not mention this to the cardiologist.   Past Medical History  Diagnosis Date  . Arthritis   . GERD (gastroesophageal reflux disease)   . Chronic right hip pain   . Irritable bowel syndrome (IBS)   . Asthma   . Degenerative disc disease, thoracic   . Scoliosis   . Hypertension   . Anxiety     There are no active problems to display for this patient.   Past Surgical History  Procedure Laterality Date  . Abdominal hysterectomy    . Tubal ligation    . Cesarean section  1985  . Cholecystectomy      Past Surgical History  Procedure Laterality Date  . Abdominal hysterectomy    . Tubal ligation    . Cesarean section  1985  . Cholecystectomy      Current Outpatient Rx  Name  Route  Sig  Dispense  Refill  . albuterol (VENTOLIN HFA) 108 (90 BASE) MCG/ACT inhaler   Inhalation   Inhale 2 puffs into the lungs every 6 (six) hours as needed.           . Asenapine Maleate (SAPHRIS SL)   Sublingual   Place 1 tablet under the tongue daily.           Marland Kitchen  azithromycin (ZITHROMAX) 250 MG tablet      2 tablets today, then 1 tablet for the next 4 days.   6 each   0   . butalbital-acetaminophen-caffeine (FIORICET) 50-325-40 MG tablet   Oral   Take 1-2 tablets by mouth every 6 (six) hours as needed for headache.   20 tablet   0   . citalopram (CELEXA) 20 MG tablet   Oral   Take 20 mg by mouth daily.           . cyclobenzaprine (FLEXERIL) 5 MG tablet   Oral   Take 1 tablet (5 mg total) by mouth every 8 (eight) hours as needed for muscle spasms.   30 tablet   1   . diazepam (VALIUM) 2 MG tablet   Oral   Take 1 tablet (2 mg total) by mouth every 8 (eight) hours as needed for muscle spasms.   15 tablet   0   . dicyclomine (BENTYL) 20 MG tablet   Oral   Take 1 tablet (20 mg total) by mouth 3 (three) times daily as needed for spasms.   30 tablet   0   . guaiFENesin-codeine 100-10 MG/5ML syrup   Oral  Take 10 mLs by mouth 3 (three) times daily as needed.   120 mL   0   . ibuprofen (ADVIL,MOTRIN) 800 MG tablet   Oral   Take 1 tablet (800 mg total) by mouth every 8 (eight) hours as needed.   30 tablet   0   . ketorolac (TORADOL) 10 MG tablet   Oral   Take 1 tablet (10 mg total) by mouth every 8 (eight) hours.   15 tablet   0   . methocarbamol (ROBAXIN) 500 MG tablet      2 po tid for spasm   30 tablet   0   . methylPREDNISolone (MEDROL DOSEPAK) 4 MG TBPK tablet      Take Tapered dose as directed   21 tablet   0   . metoCLOPramide (REGLAN) 5 MG tablet   Oral   Take 1 tablet (5 mg total) by mouth every 8 (eight) hours as needed for nausea or vomiting.   12 tablet   0   . naproxen (NAPROSYN) 500 MG tablet   Oral   Take 1 tablet (500 mg total) by mouth 2 (two) times daily with a meal.   14 tablet   0   . predniSONE (DELTASONE) 10 MG tablet   Oral   Take 1 tablet (10 mg total) by mouth daily. 6,5,4,3,2,1 six day taper   21 tablet   0   . promethazine (PHENERGAN) 12.5 MG tablet   Oral   Take 1 tablet  (12.5 mg total) by mouth every 6 (six) hours as needed for nausea or vomiting.   30 tablet   0   . propranolol (INDERAL) 40 MG tablet   Oral   Take 1 tablet (40 mg total) by mouth 2 (two) times daily.   60 tablet   0   . ranitidine (ZANTAC) 150 MG tablet   Oral   Take 150 mg by mouth 2 (two) times daily.           . traMADol (ULTRAM) 50 MG tablet   Oral   Take 1 tablet (50 mg total) by mouth every 12 (twelve) hours as needed for severe pain.   10 tablet   0     Allergies:  Amoxicillin; Aspirin; Compazine; Dilaudid; Penicillins; Proton pump inhibitors; and Zofran  Family History: History reviewed. No pertinent family history.  Social History: Social History  Substance Use Topics  . Smoking status: Former Smoker    Quit date: 02/16/2014  . Smokeless tobacco: None  . Alcohol Use: No     Review of Systems:   10 point review of systems was performed and was otherwise negative:  Constitutional: No fever Eyes: No visual disturbances ENT: No sore throat, ear pain Cardiac: Chest pain described above Respiratory: No shortness of breath, wheezing, or stridor Abdomen: No abdominal pain, no vomiting, No diarrhea Endocrine: No weight loss, No night sweats Extremities: No peripheral edema, cyanosis Skin: No rashes, easy bruising Neurologic: No focal weakness, trouble with speech or swollowing Urologic: No dysuria, Hematuria, or urinary frequency   Physical Exam:  ED Triage Vitals  Enc Vitals Group     BP 09/20/15 1916 180/102 mmHg     Pulse Rate 09/20/15 1916 77     Resp 09/20/15 1916 20     Temp 09/20/15 1916 97.9 F (36.6 C)     Temp Source 09/20/15 1916 Oral     SpO2 09/20/15 1916 97 %     Weight 09/20/15 1916 210 lb (  95.255 kg)     Height 09/20/15 1916 5\' 4"  (1.626 m)     Head Cir --      Peak Flow --      Pain Score 09/20/15 1918 8     Pain Loc --      Pain Edu? --      Excl. in GC? --     General: Awake , Alert , and Oriented times 3; GCS 15 Head:  Normal cephalic , atraumatic Eyes: Pupils equal , round, reactive to light Nose/Throat: No nasal drainage, patent upper airway without erythema or exudate.  Neck: Supple, Full range of motion, No anterior adenopathy or palpable thyroid masses Lungs: Clear to ascultation without wheezes , rhonchi, or rales Heart: Regular rate, regular rhythm without murmurs , gallops , or rubs Abdomen: Soft, non tender without rebound, guarding , or rigidity; bowel sounds positive and symmetric in all 4 quadrants. No organomegaly .        Extremities: 2 plus symmetric pulses. No edema, clubbing or cyanosis Neurologic: normal ambulation, Motor symmetric without deficits, sensory intact Skin: warm, dry, no rashes   Labs:   All laboratory work was reviewed including any pertinent negatives or positives listed below:  Labs Reviewed  BASIC METABOLIC PANEL - Abnormal; Notable for the following:    Glucose, Bld 101 (*)    Calcium 8.7 (*)    All other components within normal limits  URINALYSIS COMPLETEWITH MICROSCOPIC ([facility] ONLY) - Abnormal; Notable for the following:    Color, Urine YELLOW (*)    APPearance CLOUDY (*)    Hgb urine dipstick 2+ (*)    Protein, ur 30 (*)    Leukocytes, UA 3+ (*)    Bacteria, UA RARE (*)    Squamous Epithelial / LPF 6-30 (*)    All other components within normal limits  CBC  TROPONIN I  LIPASE, BLOOD  PREGNANCY, URINE  Laboratory work shows findings for a possible urinary tract infection Otherwise review of the laboratory work is within normal limits EKG: * ED ECG REPORT I, Jennye MoccasinBrian S Ector Laurel, the attending physician, personally viewed and interpreted this ECG.  Date: 09/20/2015 EKG Time: 1916 Rate: 80 Rhythm: normal sinus rhythm QRS Axis: normal Intervals: normal ST/T Wave abnormalities: normal Conduction Disturbances: none Narrative Interpretation: unremarkable Normal EKG  Radiology:      EXAM: CT ANGIOGRAPHY CHEST WITH  CONTRAST  TECHNIQUE: Multidetector CT imaging of the chest was performed using the standard protocol during bolus administration of intravenous contrast. Multiplanar CT image reconstructions and MIPs were obtained to evaluate the vascular anatomy.  CONTRAST: 75 mL Isovue 370 intravenous  COMPARISON: 08/03/2015  FINDINGS: Cardiovascular: There is good opacification of the pulmonary arteries. There is no pulmonary embolism. The thoracic aorta is normal in caliber and intact.  Lungs: Clear  Central airways: Normal  Effusions: None  Lymphadenopathy: None  Esophagus: Unremarkable  Upper abdomen: Small hiatal hernia  Musculoskeletal: No significant abnormality  Review of the MIP images confirms the above findings.  IMPRESSION: Negative for acute pulmonary embolism. Small hiatal hernia.   Electronically Signed By: Ellery Plunkaniel R Mitchell M.D. On: 09/20/2015 21:07          DG Chest 2 View (Final result) Result time: 09/20/15 20:02:27   Final result by Rad Results In Interface (09/20/15 20:02:27)   Narrative:   CLINICAL DATA: Intermittent chest pain for the past few days. Pain worsened at 4 o'clock today.  EXAM: CHEST 2 VIEW  COMPARISON: Chest x-ray 08/03/2015 and chest CT  08/03/2015  FINDINGS: The heart size and mediastinal contours are within normal limits. Both lungs are clear. The visualized skeletal structures are unremarkable.  IMPRESSION: Normal chest x-ray        I personally reviewed the radiologic studies    ED Course: * Differential includes all life-threatening causes for chest pain. This includes but is not exclusive to acute coronary syndrome, aortic dissection, pulmonary embolism, cardiac tamponade, community-acquired pneumonia, pericarditis, musculoskeletal chest wall pain, etc. Given the patient's current clinical presentation and objective findings I felt this might be reflux or some other cause for her chest pain that did  not require inpatient management at this time. She has a normal EKG and negative troponin with 2 weeks of continuous chest discomfort. Also been noncompliant with her blood pressure medication. She denies any urinary tract infection symptoms such as dysuria or hematuria and I felt this might be a distant incidental poor sample. Nitrite is negative.  Patient was advised to continue with her follow-up with her cardiologist to continue to solidify that this is unlikely to be cardiovascular in nature. She may have some gastroesophageal reflux disease or some other cause for her chest discomfort. I felt with a negative CT we have adequately ruled out aortic dissection and pulmonary embolism of sources. Assessment:  Nonspecific chest pain  Final Clinical Impression:   Final diagnoses:  Chest pain, unspecified chest pain type     Plan:  Outpatient management Patient was advised to return immediately if condition worsens. Patient was advised to follow up with their primary care physician or other specialized physicians involved in their outpatient care. The patient and/or family member/power of attorney had laboratory results reviewed at the bedside. All questions and concerns were addressed and appropriate discharge instructions were distributed by the nursing staff.            Jennye Moccasin, MD 09/20/15 [phone]

## 2015-11-09 ENCOUNTER — Emergency Department
Admission: EM | Admit: 2015-11-09 | Discharge: 2015-11-09 | Disposition: A | Discharge status: Home / Self Care | Payer: Medicaid Other | Attending: Emergency Medicine | Admitting: Emergency Medicine

## 2015-11-09 ENCOUNTER — Emergency Department: Payer: Medicaid Other

## 2015-11-09 ENCOUNTER — Encounter: Payer: Self-pay | Admitting: Emergency Medicine

## 2015-11-09 DIAGNOSIS — M1990 Unspecified osteoarthritis, unspecified site: Secondary | ICD-10-CM | POA: Insufficient documentation

## 2015-11-09 DIAGNOSIS — Z87891 Personal history of nicotine dependence: Secondary | ICD-10-CM | POA: Insufficient documentation

## 2015-11-09 DIAGNOSIS — J45909 Unspecified asthma, uncomplicated: Secondary | ICD-10-CM | POA: Insufficient documentation

## 2015-11-09 DIAGNOSIS — Z8719 Personal history of other diseases of the digestive system: Secondary | ICD-10-CM | POA: Insufficient documentation

## 2015-11-09 DIAGNOSIS — Z79899 Other long term (current) drug therapy: Secondary | ICD-10-CM | POA: Diagnosis not present

## 2015-11-09 DIAGNOSIS — R51 Headache: Secondary | ICD-10-CM

## 2015-11-09 DIAGNOSIS — R519 Headache, unspecified: Secondary | ICD-10-CM

## 2015-11-09 DIAGNOSIS — M419 Scoliosis, unspecified: Secondary | ICD-10-CM | POA: Insufficient documentation

## 2015-11-09 DIAGNOSIS — Z792 Long term (current) use of antibiotics: Secondary | ICD-10-CM | POA: Diagnosis not present

## 2015-11-09 DIAGNOSIS — G43909 Migraine, unspecified, not intractable, without status migrainosus: Secondary | ICD-10-CM | POA: Insufficient documentation

## 2015-11-09 DIAGNOSIS — H53149 Visual discomfort, unspecified: Secondary | ICD-10-CM | POA: Diagnosis present

## 2015-11-09 DIAGNOSIS — M5134 Other intervertebral disc degeneration, thoracic region: Secondary | ICD-10-CM | POA: Diagnosis not present

## 2015-11-09 DIAGNOSIS — I10 Essential (primary) hypertension: Secondary | ICD-10-CM | POA: Diagnosis not present

## 2015-11-09 DIAGNOSIS — Z7952 Long term (current) use of systemic steroids: Secondary | ICD-10-CM | POA: Diagnosis not present

## 2015-11-09 LAB — URINALYSIS COMPLETE WITH MICROSCOPIC (ARMC ONLY)
BACTERIA UA: NONE SEEN
Bilirubin Urine: NEGATIVE
GLUCOSE, UA: NEGATIVE mg/dL
Ketones, ur: NEGATIVE mg/dL
NITRITE: NEGATIVE
PROTEIN: NEGATIVE mg/dL
RBC / HPF: NONE SEEN RBC/hpf (ref 0–5)
SPECIFIC GRAVITY, URINE: 1.012 (ref 1.005–1.030)
Squamous Epithelial / LPF: NONE SEEN
WBC UA: NONE SEEN WBC/hpf (ref 0–5)
pH: 7 (ref 5.0–8.0)

## 2015-11-09 LAB — CBC
HEMATOCRIT: 41 % (ref 35.0–47.0)
HEMOGLOBIN: 14 g/dL (ref 12.0–16.0)
MCH: 30.9 pg (ref 26.0–34.0)
MCHC: 34.2 g/dL (ref 32.0–36.0)
MCV: 90.4 fL (ref 80.0–100.0)
Platelets: 259 10*3/uL (ref 150–440)
RBC: 4.54 MIL/uL (ref 3.80–5.20)
RDW: 13.9 % (ref 11.5–14.5)
WBC: 7.5 10*3/uL (ref 3.6–11.0)

## 2015-11-09 LAB — BASIC METABOLIC PANEL
ANION GAP: 6 (ref 5–15)
BUN: 14 mg/dL (ref 6–20)
CALCIUM: 8.9 mg/dL (ref 8.9–10.3)
CO2: 25 mmol/L (ref 22–32)
Chloride: 106 mmol/L (ref 101–111)
Creatinine, Ser: 0.8 mg/dL (ref 0.44–1.00)
GLUCOSE: 105 mg/dL — AB (ref 65–99)
POTASSIUM: 4 mmol/L (ref 3.5–5.1)
Sodium: 137 mmol/L (ref 135–145)

## 2015-11-09 LAB — POCT PREGNANCY, URINE: PREG TEST UR: NEGATIVE

## 2015-11-09 MED ORDER — METOCLOPRAMIDE HCL 10 MG PO TABS: 10.0000 mg | ORAL_TABLET | Freq: Three times a day (TID) | ORAL | Status: DC

## 2015-11-09 MED ORDER — SODIUM CHLORIDE 0.9 % IV BOLUS (SEPSIS)
1000.0000 mL | Freq: Once | INTRAVENOUS | Status: AC
  Administered 2015-11-09: 1000 mL via INTRAVENOUS

## 2015-11-09 MED ORDER — METOCLOPRAMIDE HCL 5 MG/ML IJ SOLN
10.0000 mg | Freq: Once | INTRAMUSCULAR | Status: AC
  Administered 2015-11-09: 10 mg via INTRAVENOUS
  Filled 2015-11-09: qty 2

## 2015-11-09 MED ORDER — LORAZEPAM 1 MG PO TABS: 1.0000 mg | ORAL_TABLET | Freq: Two times a day (BID) | ORAL | Status: AC | PRN

## 2015-11-09 MED ORDER — LORAZEPAM 2 MG/ML IJ SOLN
1.0000 mg | Freq: Once | INTRAMUSCULAR | Status: AC
  Administered 2015-11-09: 1 mg via INTRAVENOUS
  Filled 2015-11-09: qty 1

## 2015-11-09 NOTE — ED Notes (Signed)
Pt back from CT

## 2015-11-09 NOTE — ED Notes (Signed)
Brought in via ems with weakness and headache this am

## 2015-11-09 NOTE — Discharge Instructions (Signed)

## 2015-11-09 NOTE — ED Provider Notes (Signed)
Time Seen: Approximately 1514  I have reviewed the triage notes  Chief Complaint: No chief complaint on file.   History of Present Illness: Diane Warner is a 50 y.o. female who states that she was at a counselor earlier today and he noted that her blood pressure was running elevated. Patient is on propranolol for her blood pressure and states she has been taking it twice a day as prescribed. And states her headache started this morning is primarily right-sided and she states that she has some "" weird"" sensations on the right side of her body. She has been able to ambulate normally and describes some right hip pain. She states she has had similar headaches before and has been diagnosed with migraine headaches. She denies any trouble with speech or swallowing or any significant visual field deficits or eye pain. She does have some mild photophobia. She denies any midline neck pain or stiffness and points primarily to the right parietal region as the source of her headache. She states when they registered her blood pressure at the clinic that it was in the 170s systolic range  Past Medical History  Diagnosis Date  . Arthritis   . GERD (gastroesophageal reflux disease)   . Chronic right hip pain   . Irritable bowel syndrome (IBS)   . Asthma   . Degenerative disc disease, thoracic   . Scoliosis   . Hypertension   . Anxiety     There are no active problems to display for this patient.   Past Surgical History  Procedure Laterality Date  . Abdominal hysterectomy    . Tubal ligation    . Cesarean section  1985  . Cholecystectomy      Past Surgical History  Procedure Laterality Date  . Abdominal hysterectomy    . Tubal ligation    . Cesarean section  1985  . Cholecystectomy      Current Outpatient Rx  Name  Route  Sig  Dispense  Refill  . albuterol (VENTOLIN HFA) 108 (90 BASE) MCG/ACT inhaler   Inhalation   Inhale 2 puffs into the lungs every 6 (six) hours as needed.            . Asenapine Maleate (SAPHRIS SL)   Sublingual   Place 1 tablet under the tongue daily.           Marland Kitchen. azithromycin (ZITHROMAX) 250 MG tablet      2 tablets today, then 1 tablet for the next 4 days.   6 each   0   . butalbital-acetaminophen-caffeine (FIORICET) 50-325-40 MG tablet   Oral   Take 1-2 tablets by mouth every 6 (six) hours as needed for headache.   20 tablet   0   . citalopram (CELEXA) 20 MG tablet   Oral   Take 20 mg by mouth daily.           . cyclobenzaprine (FLEXERIL) 5 MG tablet   Oral   Take 1 tablet (5 mg total) by mouth every 8 (eight) hours as needed for muscle spasms.   30 tablet   1   . diazepam (VALIUM) 2 MG tablet   Oral   Take 1 tablet (2 mg total) by mouth every 8 (eight) hours as needed for muscle spasms.   15 tablet   0   . EXPIRED: dicyclomine (BENTYL) 20 MG tablet   Oral   Take 1 tablet (20 mg total) by mouth 3 (three) times daily as needed for spasms.  30 tablet   0   . guaiFENesin-codeine 100-10 MG/5ML syrup   Oral   Take 10 mLs by mouth 3 (three) times daily as needed.   120 mL   0   . ibuprofen (ADVIL,MOTRIN) 800 MG tablet   Oral   Take 1 tablet (800 mg total) by mouth every 8 (eight) hours as needed.   30 tablet   0   . ketorolac (TORADOL) 10 MG tablet   Oral   Take 1 tablet (10 mg total) by mouth every 8 (eight) hours.   15 tablet   0   . methocarbamol (ROBAXIN) 500 MG tablet      2 po tid for spasm   30 tablet   0   . methylPREDNISolone (MEDROL DOSEPAK) 4 MG TBPK tablet      Take Tapered dose as directed   21 tablet   0   . metoCLOPramide (REGLAN) 5 MG tablet   Oral   Take 1 tablet (5 mg total) by mouth every 8 (eight) hours as needed for nausea or vomiting.   12 tablet   0   . naproxen (NAPROSYN) 500 MG tablet   Oral   Take 1 tablet (500 mg total) by mouth 2 (two) times daily with a meal.   14 tablet   0   . predniSONE (DELTASONE) 10 MG tablet   Oral   Take 1 tablet (10 mg total) by  mouth daily. 6,5,4,3,2,1 six day taper   21 tablet   0   . promethazine (PHENERGAN) 12.5 MG tablet   Oral   Take 1 tablet (12.5 mg total) by mouth every 6 (six) hours as needed for nausea or vomiting.   30 tablet   0   . propranolol (INDERAL) 40 MG tablet   Oral   Take 1 tablet (40 mg total) by mouth 2 (two) times daily.   60 tablet   0   . ranitidine (ZANTAC) 150 MG tablet   Oral   Take 150 mg by mouth 2 (two) times daily.           . traMADol (ULTRAM) 50 MG tablet   Oral   Take 1 tablet (50 mg total) by mouth every 12 (twelve) hours as needed for severe pain.   10 tablet   0     Allergies:  Amoxicillin; Aspirin; Compazine; Dilaudid; Penicillins; Proton pump inhibitors; and Zofran  Family History: No family history on file.  Social History: Social History  Substance Use Topics  . Smoking status: Former Smoker    Quit date: 02/16/2014  . Smokeless tobacco: None  . Alcohol Use: No     Review of Systems:   10 point review of systems was performed and was otherwise negative:  Constitutional: No fever Eyes: No visual disturbances ENT: No sore throat, ear pain Cardiac: No chest pain Respiratory: No shortness of breath, wheezing, or stridor Abdomen: No abdominal pain, no vomiting, No diarrhea Endocrine: No weight loss, No night sweats Extremities: No peripheral edema, cyanosis Skin: No rashes, easy bruising Neurologic: Subjective weakness on the right side. No trouble with speech or swallowing. Urologic: No dysuria, Hematuria, or urinary frequency   Physical Exam:  ED Triage Vitals  Enc Vitals Group     BP 11/09/15 1256 141/99 mmHg     Pulse Rate 11/09/15 1256 70     Resp 11/09/15 1256 22     Temp 11/09/15 1256 97.8 F (36.6 C)     Temp Source 11/09/15 1256  Oral     SpO2 11/09/15 1256 100 %     Weight 11/09/15 1256 213 lb (96.616 kg)     Height 11/09/15 1256 5\' 4"  (1.626 m)     Head Cir --      Peak Flow --      Pain Score --      Pain Loc --       Pain Edu? --      Excl. in GC? --     General: Awake , Alert , and Oriented times 3; GCS 15 Head: Normal cephalic , atraumatic Eyes: Pupils equal , round, reactive to light Nose/Throat: No nasal drainage, patent upper airway without erythema or exudate.  Neck: Supple, Full range of motion, No anterior adenopathy or palpable thyroid masses Lungs: Clear to ascultation without wheezes , rhonchi, or rales Heart: Regular rate, regular rhythm without murmurs , gallops , or rubs Abdomen: Soft, non tender without rebound, guarding , or rigidity; bowel sounds positive and symmetric in all 4 quadrants. No organomegaly .        Extremities: 2 plus symmetric pulses. No edema, clubbing or cyanosis Neurologic: normal ambulation, Motor symmetric without deficits, sensory intact Skin: warm, dry, no rashes   Labs:   All laboratory work was reviewed including any pertinent negatives or positives listed below:  Labs Reviewed  BASIC METABOLIC PANEL - Abnormal; Notable for the following:    Glucose, Bld 105 (*)    All other components within normal limits  URINALYSIS COMPLETEWITH MICROSCOPIC ([facility] ONLY) - Abnormal; Notable for the following:    Color, Urine YELLOW (*)    APPearance CLEAR (*)    Hgb urine dipstick 1+ (*)    Leukocytes, UA TRACE (*)    All other components within normal limits  CBC  POCT PREGNANCY, URINE  CBG MONITORING, ED    EKG: ED ECG REPORT I, Jennye MoccasinBrian S Victorian Gunn, the attending physician, personally viewed and interpreted this ECG.  Date: 11/09/2015 EKG Time: 1306 Rate: 70 Rhythm: normal sinus rhythm QRS Axis: normal Intervals: normal ST/T Wave abnormalities: normal Conduction Disturbances: none Narrative Interpretation: unremarkable Normal EKG     Radiology: *  CT HEAD WO CONTRAST (Final result) Result time: 11/09/15 15:53:05   Final result by Rad Results In Interface (11/09/15 15:53:05)   Narrative:   CLINICAL DATA: Right temporal headache for 8 hours.  Hypertension.  EXAM: CT HEAD WITHOUT CONTRAST  TECHNIQUE: Contiguous axial images were obtained from the base of the skull through the vertex without intravenous contrast.  COMPARISON: 02/17/2015  FINDINGS: Brain: Unremarkable. No evidence of acute infarction, hemorrhage, hydrocephalus, or mass lesion/mass effect.  Vascular: No hyperdense vessel or unexpected calcification.  Skull: Negative for fracture or focal lesion.  Sinuses/Orbits: Anterior nasal septal perforation without neighboring mass or mucosal nodularity. No acute sinusitis. No acute finding.  Other: None.  IMPRESSION: 1. Normal intracranial imaging. 2. Nasal septum perforation.   Electronically Signed By: Marnee SpringJonathon Watts M.D. On: 11/09/2015 15:53           I personally reviewed the radiologic studies     ED Course:  Patient received treatment mainly directed at a migraine headache. I felt this was unlikely to be acute cerebrovascular accident not only based on a normal CAT scan but also clinically as patient doesn't seem to have any focal neurologic deficits at this time. Patient does have some right leg pain with movement. The patient's CAT scan here is normal and I felt lumbar puncture and further investigation of her  headache is not necessary. She states it does seem similar to previous migraines and this may be some form of complicated migraine at this time. He feels symptomatically relieved and neurologically he is returned to normal treatment for migraine with fluids and Reglan and Ativan. The patient's blood pressure is felt was more a component of anxiety based on her description. Recheck blood pressures here are within normal limits after anxiolytics    Assessment: * Acute exacerbation of chronic migraine cephalgia** Situational hypertension     Plan: Outpatient management Prescription for Reglan and Ativan Patient was advised to return immediately if condition worsens. Patient  was advised to follow up with their primary care physician or other specialized physicians involved in their outpatient care. The patient and/or family member/power of attorney had laboratory results reviewed at the bedside. All questions and concerns were addressed and appropriate discharge instructions were distributed by the nursing staff.            Jennye Moccasin, MD 11/09/15 [phone]

## 2015-11-09 NOTE — ED Notes (Signed)
Pt transferred to CT.

## 2015-12-24 ENCOUNTER — Ambulatory Visit: Payer: Medicaid Other | Attending: Internal Medicine

## 2015-12-24 DIAGNOSIS — G4733 Obstructive sleep apnea (adult) (pediatric): Secondary | ICD-10-CM | POA: Insufficient documentation

## 2016-02-11 ENCOUNTER — Encounter: Payer: Self-pay | Admitting: Emergency Medicine

## 2016-02-11 ENCOUNTER — Emergency Department
Admission: EM | Admit: 2016-02-11 | Discharge: 2016-02-11 | Disposition: A | Discharge status: Home / Self Care | Payer: Medicaid Other | Attending: Emergency Medicine | Admitting: Emergency Medicine

## 2016-02-11 DIAGNOSIS — I10 Essential (primary) hypertension: Secondary | ICD-10-CM | POA: Diagnosis not present

## 2016-02-11 DIAGNOSIS — M5442 Lumbago with sciatica, left side: Secondary | ICD-10-CM | POA: Diagnosis not present

## 2016-02-11 DIAGNOSIS — Z79899 Other long term (current) drug therapy: Secondary | ICD-10-CM | POA: Insufficient documentation

## 2016-02-11 DIAGNOSIS — J45909 Unspecified asthma, uncomplicated: Secondary | ICD-10-CM | POA: Diagnosis not present

## 2016-02-11 DIAGNOSIS — M545 Low back pain: Secondary | ICD-10-CM | POA: Diagnosis present

## 2016-02-11 DIAGNOSIS — Z87891 Personal history of nicotine dependence: Secondary | ICD-10-CM | POA: Insufficient documentation

## 2016-02-11 DIAGNOSIS — G8929 Other chronic pain: Secondary | ICD-10-CM | POA: Diagnosis not present

## 2016-02-11 DIAGNOSIS — M549 Dorsalgia, unspecified: Secondary | ICD-10-CM

## 2016-02-11 MED ORDER — TRAMADOL HCL 50 MG PO TABS
50.0000 mg | ORAL_TABLET | Freq: Once | ORAL | Status: AC
  Administered 2016-02-11: 50 mg via ORAL
  Filled 2016-02-11: qty 1

## 2016-02-11 MED ORDER — TRAMADOL HCL 50 MG PO TABS: 50.0000 mg | ORAL_TABLET | Freq: Four times a day (QID) | ORAL | 0 refills | Status: DC | PRN

## 2016-02-11 NOTE — ED Provider Notes (Signed)
[facility] Emergency Department Provider Note  ___________________________________________   First MD Initiated Contact with Patient 02/11/16 1107     (approximate)  I have reviewed the triage vital signs and the nursing notes.   HISTORY  Chief Complaint Back Pain   HPI Diane Warner is a 50 y.o. female 0 complaint of chronic low back pain that became worse last night. Patient states that she has had pain for many months but has not seen an orthopedist. She states that she does have a primary care doctor but is not followed up with him perform an appointment with the orthopedist. She denies any urinary symptoms. She denies any changes in her pain. Today she complains of low back pain that radiates down the left side of her leg. She denies any hematuria, nausea, vomiting, fever or chills. She continues to ambulate without assistance. Patient currently is not taking any over-the-counter medication at home for her pain. She denies any saddle paresthesias or incontinence of bowel or bladder. She rates her pain as an 8/10.   Past Medical History:  Diagnosis Date  . Anxiety   . Arthritis   . Asthma   . Chronic right hip pain   . Degenerative disc disease, thoracic   . GERD (gastroesophageal reflux disease)   . Hypertension   . Irritable bowel syndrome (IBS)   . Scoliosis     There are no active problems to display for this patient.   Past Surgical History:  Procedure Laterality Date  . ABDOMINAL HYSTERECTOMY    . CESAREAN SECTION  1985  . CHOLECYSTECTOMY    . TUBAL LIGATION      Prior to Admission medications   Medication Sig Start Date End Date Taking? Authorizing Provider  albuterol (VENTOLIN HFA) 108 (90 BASE) MCG/ACT inhaler Inhale 2 puffs into the lungs every 6 (six) hours as needed.      Historical Provider, MD  Asenapine Maleate (SAPHRIS SL) Place 1 tablet under the tongue daily.      Historical Provider, MD  azithromycin (ZITHROMAX)  250 MG tablet 2 tablets today, then 1 tablet for the next 4 days. 04/20/15   Chinita Pester, FNP  butalbital-acetaminophen-caffeine (FIORICET) 50-325-40 MG tablet Take 1-2 tablets by mouth every 6 (six) hours as needed for headache. 02/17/15 02/17/16  Emily Filbert, MD  citalopram (CELEXA) 20 MG tablet Take 20 mg by mouth daily.      Historical Provider, MD  cyclobenzaprine (FLEXERIL) 5 MG tablet Take 1 tablet (5 mg total) by mouth every 8 (eight) hours as needed for muscle spasms. 06/05/15   Evon Slack, PA-C  diazepam (VALIUM) 2 MG tablet Take 1 tablet (2 mg total) by mouth every 8 (eight) hours as needed for muscle spasms. 10/17/14   Irean Hong, MD  dicyclomine (BENTYL) 20 MG tablet Take 1 tablet (20 mg total) by mouth 3 (three) times daily as needed for spasms. 10/19/14 10/19/15  Phineas Semen, MD  guaiFENesin-codeine 100-10 MG/5ML syrup Take 10 mLs by mouth 3 (three) times daily as needed. 04/20/15   Chinita Pester, FNP  ibuprofen (ADVIL,MOTRIN) 800 MG tablet Take 1 tablet (800 mg total) by mouth every 8 (eight) hours as needed. 03/23/15   Joni Reining, PA-C  ketorolac (TORADOL) 10 MG tablet Take 1 tablet (10 mg total) by mouth every 8 (eight) hours. 02/11/15   Jenise V Bacon Menshew, PA-C  LORazepam (ATIVAN) 1 MG tablet Take 1 tablet (1 mg total) by mouth 2 (  two) times daily as needed for anxiety. 11/09/15 11/08/16  Jennye MoccasinBrian S Quigley, MD  LORazepam (ATIVAN) 1 MG tablet Take 1 mg by mouth 2 (two) times daily.    Historical Provider, MD  methocarbamol (ROBAXIN) 500 MG tablet 2 po tid for spasm 01/19/11   Ivery QualeHobson Bryant, PA-C  methylPREDNISolone (MEDROL DOSEPAK) 4 MG TBPK tablet Take Tapered dose as directed 05/14/15   Joni Reiningonald K Smith, PA-C  metoCLOPramide (REGLAN) 10 MG tablet Take 1 tablet (10 mg total) by mouth 3 (three) times daily with meals. 11/09/15 11/08/16  Jennye MoccasinBrian S Quigley, MD  naproxen (NAPROSYN) 500 MG tablet Take 1 tablet (500 mg total) by mouth 2 (two) times daily with a meal. 10/17/14   Irean HongJade J  Sung, MD  predniSONE (DELTASONE) 10 MG tablet Take 1 tablet (10 mg total) by mouth daily. 6,5,4,3,2,1 six day taper 06/05/15   Evon Slackhomas C Gaines, PA-C  promethazine (PHENERGAN) 12.5 MG tablet Take 1 tablet (12.5 mg total) by mouth every 6 (six) hours as needed for nausea or vomiting. 08/05/15   Loleta Roseory Forbach, MD  propranolol (INDERAL) 40 MG tablet Take 1 tablet (40 mg total) by mouth 2 (two) times daily. 09/20/15   Jennye MoccasinBrian S Quigley, MD  ranitidine (ZANTAC) 150 MG tablet Take 150 mg by mouth 2 (two) times daily.      Historical Provider, MD  traMADol (ULTRAM) 50 MG tablet Take 1 tablet (50 mg total) by mouth every 6 (six) hours as needed. 02/11/16   Tommi Rumpshonda L Summers, PA-C    Allergies Amoxicillin; Aspirin; Compazine; Dilaudid [hydromorphone hcl]; Penicillins; Proton pump inhibitors; and Zofran [ondansetron hcl]  No family history on file.  Social History Social History  Substance Use Topics  . Smoking status: Former Smoker    Quit date: 02/16/2014  . Smokeless tobacco: Never Used  . Alcohol use No    Review of Systems Constitutional: No fever/chills Cardiovascular: Denies chest pain. Respiratory: Denies shortness of breath. Gastrointestinal: No abdominal pain.  No nausea, no vomiting.   Genitourinary: Negative for dysuria. Musculoskeletal: Positive for chronic back pain. Positive for left leg radiculopathy. Skin: Negative for rash. Neurological: Negative for headaches, focal weakness or numbness.  10-point ROS otherwise negative.  ____________________________________________   PHYSICAL EXAM:  VITAL SIGNS: ED Triage Vitals  Enc Vitals Group     BP 02/11/16 1036 114/85     Pulse Rate 02/11/16 1036 86     Resp 02/11/16 1036 18     Temp 02/11/16 1036 98 F (36.7 C)     Temp Source 02/11/16 1036 Oral     SpO2 02/11/16 1036 96 %     Weight 02/11/16 1036 212 lb (96.2 kg)     Height 02/11/16 1036 5\' 4"  (1.626 m)     Head Circumference --      Peak Flow --      Pain Score 02/11/16  1037 8     Pain Loc --      Pain Edu? --      Excl. in GC? --     Constitutional: Alert and oriented. Well appearing and in no acute distress. Eyes: Conjunctivae are normal. PERRL. EOMI. Head: Atraumatic. Nose: No congestion/rhinnorhea. Neck: No stridor.   Cardiovascular: Normal rate, regular rhythm. Grossly normal heart sounds.  Good peripheral circulation. Respiratory: Normal respiratory effort.  No retractions. Lungs CTAB. Gastrointestinal: Soft and nontender. No distention.  No CVA tenderness. Musculoskeletal: Examination back there is no gross deformity. There is some soft tissue swelling and left-sided SI joint pain  on palpation. There is no active muscle spasm seen on exam. Range of motion is guarded secondary to patient's discomfort. Straight leg raises were 70 bilaterally with some discomfort on the left. Reflexes were 2+ bilaterally. Motor sensory function intact. Normal gait was noted. Neurologic:  Normal speech and language. No gross focal neurologic deficits are appreciated. No gait instability. Skin:  Skin is warm, dry and intact. No rash noted. Psychiatric: Mood and affect are normal. Speech and behavior are normal.  ____________________________________________   LABS (all labs ordered are listed, but only abnormal results are displayed)  Labs Reviewed - No data to display    PROCEDURES  Procedure(s) performed: None  Procedures  Critical Care performed: No  ____________________________________________   INITIAL IMPRESSION / ASSESSMENT AND PLAN / ED COURSE  Pertinent labs & imaging results that were available during my care of the patient were reviewed by me and considered in my medical decision making (see chart for details).    Clinical Course   Patient is follow-up with Dr. Hyacinth Meeker or her primary care doctor for any continued pain medication and for further evaluation and treatment of her back pain. Patient was given tramadol in the emergency room with  a prescription for 10 tablets. Patient is encouraged to make aN appointment for her chronic back pain.  ____________________________________________   FINAL CLINICAL IMPRESSION(S) / ED DIAGNOSES  Final diagnoses:  Left-sided low back pain with left-sided sciatica  Chronic back pain      NEW MEDICATIONS STARTED DURING THIS VISIT:  Discharge Medication List as of 02/11/2016 12:06 PM       Note:  This document was prepared using Dragon voice recognition software and may include unintentional dictation errors.    Tommi Rumps, PA-C 02/11/16 1313    Jennye Moccasin, MD 02/11/16 [phone]

## 2016-02-11 NOTE — ED Triage Notes (Signed)
Pt here with c/o chronic lower back pain, worsening last night.

## 2016-02-11 NOTE — Discharge Instructions (Signed)
Me to follow up with your primary care doctor or call and make an appointment with Dr. Hyacinth MeekerMiller about your chronic back pain. Begin taking tramadol 1 every 6 hours as needed for back pain. You may also use ice or heat to your back.

## 2016-03-20 ENCOUNTER — Encounter: Payer: Self-pay | Admitting: *Deleted

## 2016-03-20 ENCOUNTER — Emergency Department
Admission: EM | Admit: 2016-03-20 | Discharge: 2016-03-20 | Disposition: A | Discharge status: Home / Self Care | Payer: Medicaid Other | Attending: Emergency Medicine | Admitting: Emergency Medicine

## 2016-03-20 DIAGNOSIS — I10 Essential (primary) hypertension: Secondary | ICD-10-CM | POA: Diagnosis not present

## 2016-03-20 DIAGNOSIS — Z792 Long term (current) use of antibiotics: Secondary | ICD-10-CM | POA: Insufficient documentation

## 2016-03-20 DIAGNOSIS — S025XXA Fracture of tooth (traumatic), initial encounter for closed fracture: Secondary | ICD-10-CM | POA: Diagnosis not present

## 2016-03-20 DIAGNOSIS — Y9389 Activity, other specified: Secondary | ICD-10-CM | POA: Diagnosis not present

## 2016-03-20 DIAGNOSIS — Y929 Unspecified place or not applicable: Secondary | ICD-10-CM | POA: Insufficient documentation

## 2016-03-20 DIAGNOSIS — Z79899 Other long term (current) drug therapy: Secondary | ICD-10-CM | POA: Diagnosis not present

## 2016-03-20 DIAGNOSIS — X58XXXA Exposure to other specified factors, initial encounter: Secondary | ICD-10-CM | POA: Insufficient documentation

## 2016-03-20 DIAGNOSIS — K0889 Other specified disorders of teeth and supporting structures: Secondary | ICD-10-CM

## 2016-03-20 DIAGNOSIS — Y999 Unspecified external cause status: Secondary | ICD-10-CM | POA: Diagnosis not present

## 2016-03-20 DIAGNOSIS — J45909 Unspecified asthma, uncomplicated: Secondary | ICD-10-CM | POA: Diagnosis not present

## 2016-03-20 DIAGNOSIS — Z87891 Personal history of nicotine dependence: Secondary | ICD-10-CM | POA: Diagnosis not present

## 2016-03-20 MED ORDER — CLINDAMYCIN HCL 150 MG PO CAPS: 450.0000 mg | ORAL_CAPSULE | Freq: Three times a day (TID) | ORAL | 0 refills | Status: AC

## 2016-03-20 MED ORDER — BUPIVACAINE HCL (PF) 0.5 % IJ SOLN
5.0000 mL | Freq: Once | INTRAMUSCULAR | Status: DC
  Filled 2016-03-20: qty 30

## 2016-03-20 MED ORDER — LIDOCAINE HCL (PF) 1 % IJ SOLN
5.0000 mL | Freq: Once | INTRAMUSCULAR | Status: DC
  Filled 2016-03-20: qty 5

## 2016-03-20 MED ORDER — IBUPROFEN 800 MG PO TABS: 800.0000 mg | ORAL_TABLET | Freq: Three times a day (TID) | ORAL | 0 refills | Status: DC | PRN

## 2016-03-20 MED ORDER — TRAMADOL HCL 50 MG PO TABS: 50.0000 mg | ORAL_TABLET | Freq: Four times a day (QID) | ORAL | 0 refills | Status: DC | PRN

## 2016-03-20 NOTE — ED Triage Notes (Signed)
Pt complains of  A toothache on the left lower portion of mouth, pt denies any other symptoms

## 2016-03-20 NOTE — ED Notes (Signed)
Pt c/o dental pain to left side since last night. No swelling noted at this time

## 2016-03-20 NOTE — ED Provider Notes (Signed)
[facility]-EMERGENCY DEPARTMENT Provider Note   CSN: [id number] Arrival date & time: 03/20/16  1703     History   Chief Complaint Chief Complaint  Patient presents with  . Dental Pain    HPI Diane Warner is a 50 y.o. female presents to the emergency pertinent for evaluation of left lower dental pain. Patient states she cracked her left lower second molar one day ago chopping on a jawbreaker. She has had severe 9 out of 10 pain with throbbing sensation. She denies any fevers, difficulty swallowing. No headache, neck pain. She has tried ibuprofen with no improvement. She denies any drainage.  HPI  Past Medical History:  Diagnosis Date  . Anxiety   . Arthritis   . Asthma   . Chronic right hip pain   . Degenerative disc disease, thoracic   . GERD (gastroesophageal reflux disease)   . Hypertension   . Irritable bowel syndrome (IBS)   . Scoliosis     There are no active problems to display for this patient.   Past Surgical History:  Procedure Laterality Date  . ABDOMINAL HYSTERECTOMY    . CESAREAN SECTION  1985  . CHOLECYSTECTOMY    . TUBAL LIGATION      OB History    Gravida Para Term Preterm AB Living   7 5 3 2 2  0   SAB TAB Ectopic Multiple Live Births   2 0 0 0         Home Medications    Prior to Admission medications   Medication Sig Start Date End Date Taking? Authorizing Provider  albuterol (VENTOLIN HFA) 108 (90 BASE) MCG/ACT inhaler Inhale 2 puffs into the lungs every 6 (six) hours as needed.      Historical Provider, MD  Asenapine Maleate (SAPHRIS SL) Place 1 tablet under the tongue daily.      Historical Provider, MD  azithromycin (ZITHROMAX) 250 MG tablet 2 tablets today, then 1 tablet for the next 4 days. 04/20/15   Chinita Pesterari B Triplett, FNP  citalopram (CELEXA) 20 MG tablet Take 20 mg by mouth daily.      Historical Provider, MD  clindamycin (CLEOCIN) 150 MG capsule Take 3 capsules (450 mg total) by mouth 3 (three) times daily. X 7 days 03/20/16  03/30/16  Evon Slackhomas C Gaines, PA-C  cyclobenzaprine (FLEXERIL) 5 MG tablet Take 1 tablet (5 mg total) by mouth every 8 (eight) hours as needed for muscle spasms. 06/05/15   Evon Slackhomas C Gaines, PA-C  diazepam (VALIUM) 2 MG tablet Take 1 tablet (2 mg total) by mouth every 8 (eight) hours as needed for muscle spasms. 10/17/14   Irean HongJade J Sung, MD  dicyclomine (BENTYL) 20 MG tablet Take 1 tablet (20 mg total) by mouth 3 (three) times daily as needed for spasms. 10/19/14 10/19/15  Phineas SemenGraydon Goodman, MD  guaiFENesin-codeine 100-10 MG/5ML syrup Take 10 mLs by mouth 3 (three) times daily as needed. 04/20/15   Chinita Pesterari B Triplett, FNP  ibuprofen (ADVIL,MOTRIN) 800 MG tablet Take 1 tablet (800 mg total) by mouth every 8 (eight) hours as needed. 03/20/16   Evon Slackhomas C Gaines, PA-C  ketorolac (TORADOL) 10 MG tablet Take 1 tablet (10 mg total) by mouth every 8 (eight) hours. 02/11/15   Jenise V Bacon Menshew, PA-C  LORazepam (ATIVAN) 1 MG tablet Take 1 tablet (1 mg total) by mouth 2 (two) times daily as needed for anxiety. 11/09/15 11/08/16  Jennye MoccasinBrian S Quigley, MD  LORazepam (ATIVAN) 1 MG tablet Take 1 mg by mouth 2 (  two) times daily.    Historical Provider, MD  methocarbamol (ROBAXIN) 500 MG tablet 2 po tid for spasm 01/19/11   Ivery Quale, PA-C  methylPREDNISolone (MEDROL DOSEPAK) 4 MG TBPK tablet Take Tapered dose as directed 05/14/15   Joni Reining, PA-C  metoCLOPramide (REGLAN) 10 MG tablet Take 1 tablet (10 mg total) by mouth 3 (three) times daily with meals. 11/09/15 11/08/16  Jennye Moccasin, MD  naproxen (NAPROSYN) 500 MG tablet Take 1 tablet (500 mg total) by mouth 2 (two) times daily with a meal. 10/17/14   Irean Hong, MD  predniSONE (DELTASONE) 10 MG tablet Take 1 tablet (10 mg total) by mouth daily. 6,5,4,3,2,1 six day taper 06/05/15   Evon Slack, PA-C  promethazine (PHENERGAN) 12.5 MG tablet Take 1 tablet (12.5 mg total) by mouth every 6 (six) hours as needed for nausea or vomiting. 08/05/15   Loleta Rose, MD  propranolol  (INDERAL) 40 MG tablet Take 1 tablet (40 mg total) by mouth 2 (two) times daily. 09/20/15   Jennye Moccasin, MD  ranitidine (ZANTAC) 150 MG tablet Take 150 mg by mouth 2 (two) times daily.      Historical Provider, MD  traMADol (ULTRAM) 50 MG tablet Take 1 tablet (50 mg total) by mouth every 6 (six) hours as needed. 03/20/16   Evon Slack, PA-C    Family History No family history on file.  Social History Social History  Substance Use Topics  . Smoking status: Former Smoker    Quit date: 02/16/2014  . Smokeless tobacco: Never Used  . Alcohol use No     Allergies   Amoxicillin; Aspirin; Compazine; Dilaudid [hydromorphone hcl]; Penicillins; Proton pump inhibitors; and Zofran [ondansetron hcl]   Review of Systems Review of Systems  Constitutional: Negative for chills and fever.  HENT: Positive for dental problem. Negative for ear pain and sore throat.   Eyes: Negative for pain and visual disturbance.  Respiratory: Negative for cough and shortness of breath.   Cardiovascular: Negative for chest pain and palpitations.  Gastrointestinal: Negative for abdominal pain and vomiting.  Genitourinary: Negative for dysuria and hematuria.  Musculoskeletal: Negative for arthralgias and back pain.  Skin: Negative for color change and rash.  Neurological: Negative for seizures and syncope.  All other systems reviewed and are negative.    Physical Exam Updated Vital Signs BP (!) 142/89 (BP Location: Right Arm)   Pulse 84   Temp 97.7 F (36.5 C) (Oral)   Resp 16   Ht 5\' 4"  (1.626 m)   Wt 91.6 kg   LMP  (LMP Unknown)   SpO2 98%   BMI 34.67 kg/m   Physical Exam  Constitutional: She appears well-developed and well-nourished. No distress.  HENT:  Head: Normocephalic and atraumatic.  Mouth/Throat: Oropharynx is clear and moist and mucous membranes are normal. No oral lesions. No trismus in the jaw. Dental caries (left lower second molar with avulsion) present. No dental abscesses or  uvula swelling. No oropharyngeal exudate, posterior oropharyngeal edema, posterior oropharyngeal erythema or tonsillar abscesses.  Eyes: Conjunctivae are normal.  Neck: Neck supple.  Cardiovascular: Normal rate and regular rhythm.   No murmur heard. Pulmonary/Chest: Effort normal and breath sounds normal. No respiratory distress.  Abdominal: Soft. There is no tenderness.  Musculoskeletal: She exhibits no edema.  Neurological: She is alert.  Skin: Skin is warm and dry.  Psychiatric: She has a normal mood and affect.  Nursing note and vitals reviewed.    ED Treatments /  Results  Labs (all labs ordered are listed, but only abnormal results are displayed) Labs Reviewed - No data to display  EKG  EKG Interpretation None       Radiology No results found.  Procedures Procedures (including critical care time) Risks, benefits, complications of a left mandibular nerve block were discussed with the patient. Patient agreed and consented to procedure. 5 cc 1% lidocaine along with 5 cc of 0.5% bupivacaine were injected with a 25-gauge 1-1/2 inch needle. A bolus solution was injected along the left mandibular nerve. Patient tolerated procedure well and had 100% relief of pain after the procedure.  Medications Ordered in ED Medications  bupivacaine (MARCAINE) 0.5 % injection 5 mL (not administered)  lidocaine (PF) (XYLOCAINE) 1 % injection 5 mL (not administered)     Initial Impression / Assessment and Plan / ED Course  I have reviewed the triage vital signs and the nursing notes.  Pertinent labs & imaging results that were available during my care of the patient were reviewed by me and considered in my medical decision making (see chart for details).  Clinical Course     50 year old female with dental pain, dental fracture to the left lower second molar. Patient agreed and consented to a left mandibular nerve block. She tolerated procedure well and had 100% pain relief. She is  placed on clindamycin, tramadol, ibuprofen. She is given information on dental clinics. She is educated on signs and symptoms return to the emergency department for.  Final Clinical Impressions(s) / ED Diagnoses   Final diagnoses:  Closed fracture of tooth, initial encounter  Pain, dental    New Prescriptions New Prescriptions   CLINDAMYCIN (CLEOCIN) 150 MG CAPSULE    Take 3 capsules (450 mg total) by mouth 3 (three) times daily. X 7 days   IBUPROFEN (ADVIL,MOTRIN) 800 MG TABLET    Take 1 tablet (800 mg total) by mouth every 8 (eight) hours as needed.   TRAMADOL (ULTRAM) 50 MG TABLET    Take 1 tablet (50 mg total) by mouth every 6 (six) hours as needed.     Evon Slackhomas C Gaines, PA-C 03/20/16 1813    Jennye MoccasinBrian S Quigley, MD 03/20/16 [phone]

## 2016-03-20 NOTE — Discharge Instructions (Signed)
Please take medications as prescribed. Return to the ER for any worsening symptoms urgent changes in health.

## 2016-04-11 DIAGNOSIS — E669 Obesity, unspecified: Secondary | ICD-10-CM | POA: Insufficient documentation

## 2016-04-11 DIAGNOSIS — M1990 Unspecified osteoarthritis, unspecified site: Secondary | ICD-10-CM | POA: Insufficient documentation

## 2016-04-11 DIAGNOSIS — Z029 Encounter for administrative examinations, unspecified: Secondary | ICD-10-CM | POA: Insufficient documentation

## 2016-04-11 DIAGNOSIS — G894 Chronic pain syndrome: Secondary | ICD-10-CM | POA: Insufficient documentation

## 2016-04-11 DIAGNOSIS — K219 Gastro-esophageal reflux disease without esophagitis: Secondary | ICD-10-CM | POA: Insufficient documentation

## 2016-04-11 DIAGNOSIS — M419 Scoliosis, unspecified: Secondary | ICD-10-CM | POA: Insufficient documentation

## 2016-04-11 DIAGNOSIS — I10 Essential (primary) hypertension: Secondary | ICD-10-CM | POA: Insufficient documentation

## 2016-04-11 DIAGNOSIS — M545 Low back pain: Secondary | ICD-10-CM

## 2016-04-11 DIAGNOSIS — M5134 Other intervertebral disc degeneration, thoracic region: Secondary | ICD-10-CM | POA: Insufficient documentation

## 2016-04-11 DIAGNOSIS — M25551 Pain in right hip: Secondary | ICD-10-CM

## 2016-04-11 DIAGNOSIS — F419 Anxiety disorder, unspecified: Secondary | ICD-10-CM | POA: Insufficient documentation

## 2016-04-11 DIAGNOSIS — K589 Irritable bowel syndrome without diarrhea: Secondary | ICD-10-CM | POA: Insufficient documentation

## 2016-04-11 DIAGNOSIS — F1190 Opioid use, unspecified, uncomplicated: Secondary | ICD-10-CM | POA: Insufficient documentation

## 2016-04-11 DIAGNOSIS — E66812 Obesity, class 2: Secondary | ICD-10-CM | POA: Insufficient documentation

## 2016-04-11 DIAGNOSIS — G8929 Other chronic pain: Secondary | ICD-10-CM | POA: Insufficient documentation

## 2016-04-11 DIAGNOSIS — Z79899 Other long term (current) drug therapy: Secondary | ICD-10-CM | POA: Insufficient documentation

## 2016-04-11 DIAGNOSIS — J45909 Unspecified asthma, uncomplicated: Secondary | ICD-10-CM | POA: Insufficient documentation

## 2016-04-11 DIAGNOSIS — Z79891 Long term (current) use of opiate analgesic: Secondary | ICD-10-CM | POA: Insufficient documentation

## 2016-04-11 NOTE — Progress Notes (Deleted)
Patient's Name: Diane Warner  MRN: [id number]  Referring Provider: Elisabeth Cara, NP  DOB: May 16, 1966  PCP: Morrell Riddle  DOS: 04/12/2016  Note by: Kathlen Brunswick. Dossie Arbour, MD  Service setting: Ambulatory outpatient  Specialty: Interventional Pain Management  Location: [facility] (AMB) Pain Management Facility    Patient type: New Patient   Primary Reason(s) for Visit: Initial Patient Evaluation CC: No chief complaint on file.  HPI  Diane Warner is a 50 y.o. year old, female patient, who comes today for an initial evaluation. She has Anxiety; Arthritis; Asthma; Chronic right hip pain; Degenerative disc disease, thoracic; GERD (gastroesophageal reflux disease); Hypertension; Irritable bowel syndrome (IBS); Scoliosis; Chronic pain syndrome; Chronic bilateral low back pain without sciatica; Long term prescription benzodiazepine use; Encounter for opiate analgesic use agreement; Long term current use of opiate analgesic; Long term prescription opiate use; Opiate use; and Obesity, Class II, BMI 35-39.9 on her problem list.. Her primarily concern today is the No chief complaint on file.  Pain Assessment: Self-Reported Pain Score:  /10             Reported level is compatible with observation.          Onset and Duration: {Hx; Onset and Duration:210120511} Cause of pain: {Hx; Cause:210120521} Severity: {Pain Severity:210120502} Timing: {Symptoms; Timing:210120501} Aggravating Factors: {Causes; Aggravating pain factors:210120507} Alleviating Factors: {Causes; Alleviating Factors:210120500} Associated Problems: {Hx; Associated problems:210120515} Quality of Pain: {Hx; Symptom quality or Descriptor:210120531} Previous Examinations or Tests: {Hx; Previous examinations or test:210120529} Previous Treatments: {Hx; Previous Treatment:210120503}  The patient comes into the clinics today for the first time for a chronic pain management evaluation. ***  Today I took the time to provide  the patient with information regarding my pain practice. The patient was informed that my practice is divided into two sections: an interventional pain management section, as well as a completely separate and distinct medication management section. The interventional portion of my practice takes place on Tuesdays and Thursdays, while the medication management is conducted on Mondays and Wednesdays. Because of the amount of documentation required on both them, they are kept separated. This means that there is the possibility that the patient may be scheduled for a procedure on Tuesday, while also having a medication management appointment on Wednesday. I have also informed the patient that because of current staffing and facility limitations, I no longer take patients for medication management only. To illustrate the reasons for this, I gave the patient the example of a surgeon and how inappropriate it would be to refer a patient to his/her practice so that they write for the post-procedure antibiotics on a surgery done by someone else.   The patient was informed that joining my practice means that they are open to any and all interventional therapies. I clarified for the patient that this does not mean that they will be forced to have any procedures done. What it means is that patients looking for a practitioner to simply write for their pain medications and not take advantage of other interventional techniques will be better served by a different practitioner, other than myself. I made it clear that I prefer to spend my time providing those services that I specialize in.  The patient was also made aware of my Comprehensive Pain Management Safety Guidelines where by joining my practice, they limit all of their nerve blocks and joint injections to those done by our practice, for as long as we are retained to manage their care.   Historic Controlled  Substance Pharmacotherapy Review  PMP and historical list of  controlled substances: *** Highest analgesic regimen found: *** Most recent analgesic: *** Highest recorded MME/day: *** mg/day MME/day: *** mg/day Medications: The patient did not bring the medication(s) to the appointment, as requested in our "New Patient Package" Pharmacodynamics: Desired effects: Analgesia: The patient reports >50% benefit. Reported improvement in function: The patient reports medication allows her to accomplish basic ADLs. Clinically meaningful improvement in function (CMIF): Sustained CMIF goals met Perceived effectiveness: Described as relatively effective, allowing for increase in activities of daily living (ADL) Undesirable effects: Side-effects or Adverse reactions: None reported Historical Monitoring: The patient  reports that she does not use drugs.. No results found for: MDMA, COCAINSCRNUR, PCPSCRNUR, THCU, ETH Historical Background Evaluation: [place] PDMP: Six (6) year initial data search conducted.             [place] Department of public safety, offender search: Editor, commissioning Information) Non-contributory Risk Assessment Profile: Aberrant behavior: None observed or detected today Risk factors for fatal opioid overdose: None identified today Fatal overdose hazard ratio (HR): Calculation deferred Non-fatal overdose hazard ratio (HR): Calculation deferred Risk of opioid abuse or dependence: 0.7-3.0% with doses ? 36 MME/day and 6.1-26% with doses ? 120 MME/day. Substance use disorder (SUD) risk level: Pending results of Medical Psychology Evaluation for SUD Opioid risk tool (ORT) (Total Score):    ORT Scoring interpretation table:  Score <3 = Low Risk for SUD  Score between 4-7 = Moderate Risk for SUD  Score >8 = High Risk for Opioid Abuse   PHQ-2 Depression Scale:  Total score:    PHQ-2 Scoring interpretation table: (Score and probability of major depressive disorder)  Score 0 = No depression  Score 1 = 15.4% Probability  Score 2 = 21.1% Probability  Score 3 =  38.4% Probability  Score 4 = 45.5% Probability  Score 5 = 56.4% Probability  Score 6 = 78.6% Probability   PHQ-9 Depression Scale:  Total score:    PHQ-9 Scoring interpretation table:  Score 0-4 = No depression  Score 5-9 = Mild depression  Score 10-14 = Moderate depression  Score 15-19 = Moderately severe depression  Score 20-27 = Severe depression (2.4 times higher risk of SUD and 2.89 times higher risk of overuse)   Pharmacologic Plan: Pending ordered tests and/or consults  Meds  The patient has a current medication list which includes the following prescription(s): albuterol, asenapine maleate, azithromycin, citalopram, cyclobenzaprine, diazepam, dicyclomine, guaifenesin-codeine, ibuprofen, ketorolac, lorazepam, lorazepam, methocarbamol, methylprednisolone, metoclopramide, naproxen, prednisone, promethazine, propranolol, ranitidine, and tramadol.  Current Outpatient Prescriptions on File Prior to Visit  Medication Sig  . albuterol (VENTOLIN HFA) 108 (90 BASE) MCG/ACT inhaler Inhale 2 puffs into the lungs every 6 (six) hours as needed.    . Asenapine Maleate (SAPHRIS SL) Place 1 tablet under the tongue daily.    Marland Kitchen azithromycin (ZITHROMAX) 250 MG tablet 2 tablets today, then 1 tablet for the next 4 days.  . citalopram (CELEXA) 20 MG tablet Take 20 mg by mouth daily.    . cyclobenzaprine (FLEXERIL) 5 MG tablet Take 1 tablet (5 mg total) by mouth every 8 (eight) hours as needed for muscle spasms.  . diazepam (VALIUM) 2 MG tablet Take 1 tablet (2 mg total) by mouth every 8 (eight) hours as needed for muscle spasms.  Marland Kitchen dicyclomine (BENTYL) 20 MG tablet Take 1 tablet (20 mg total) by mouth 3 (three) times daily as needed for spasms.  Marland Kitchen guaiFENesin-codeine 100-10 MG/5ML syrup Take 10 mLs by  mouth 3 (three) times daily as needed.  Marland Kitchen ibuprofen (ADVIL,MOTRIN) 800 MG tablet Take 1 tablet (800 mg total) by mouth every 8 (eight) hours as needed.  Marland Kitchen ketorolac (TORADOL) 10 MG tablet Take 1 tablet  (10 mg total) by mouth every 8 (eight) hours.  Marland Kitchen LORazepam (ATIVAN) 1 MG tablet Take 1 tablet (1 mg total) by mouth 2 (two) times daily as needed for anxiety.  Marland Kitchen LORazepam (ATIVAN) 1 MG tablet Take 1 mg by mouth 2 (two) times daily.  . methocarbamol (ROBAXIN) 500 MG tablet 2 po tid for spasm  . methylPREDNISolone (MEDROL DOSEPAK) 4 MG TBPK tablet Take Tapered dose as directed  . metoCLOPramide (REGLAN) 10 MG tablet Take 1 tablet (10 mg total) by mouth 3 (three) times daily with meals.  . naproxen (NAPROSYN) 500 MG tablet Take 1 tablet (500 mg total) by mouth 2 (two) times daily with a meal.  . predniSONE (DELTASONE) 10 MG tablet Take 1 tablet (10 mg total) by mouth daily. 6,5,4,3,2,1 six day taper  . promethazine (PHENERGAN) 12.5 MG tablet Take 1 tablet (12.5 mg total) by mouth every 6 (six) hours as needed for nausea or vomiting.  . propranolol (INDERAL) 40 MG tablet Take 1 tablet (40 mg total) by mouth 2 (two) times daily.  . ranitidine (ZANTAC) 150 MG tablet Take 150 mg by mouth 2 (two) times daily.    . traMADol (ULTRAM) 50 MG tablet Take 1 tablet (50 mg total) by mouth every 6 (six) hours as needed.   No current facility-administered medications on file prior to visit.    Imaging Review  Shoulder Imaging: Shoulder-R DG:  Results for orders placed in visit on 08/16/05  DG Shoulder Right   Narrative * PRIOR REPORT IMPORTED FROM AN EXTERNAL SYSTEM *   PRIOR REPORT IMPORTED FROM THE [facility] WORKFLOW SYSTEM   REASON FOR EXAM:  Pain  COMMENTS:  LMP: [id number]   PROCEDURE:     DXR - DXR SHOULDER RIGHT COMPLETE  - Aug 16 2005  3:32PM   RESULT:          Three views reveals no fractures, no dislocations.  The  joint space is intact.   IMPRESSION:          No acute fracture is noted of the RIGHT shoulder.   Thank you for this opportunity to contribute to the care of your patient.       Lumbosacral Imaging: Lumbar DG 2-3 views:  Results for orders placed in visit on 03/17/14  DG Lumbar  Spine 2-3 Views   Narrative * PRIOR REPORT IMPORTED FROM AN EXTERNAL SYSTEM *   CLINICAL DATA:  Low back pain radiating to both legs after slip and  fall.   EXAM:  LUMBAR SPINE - 2-3 VIEW   COMPARISON:  01/23/2013   FINDINGS:  There is no evidence of a lumbar spine fracture. Alignment is  normal. Vertebral body heights are maintained. Mild degenerative  endplate changes.   IMPRESSION:  No acute bone abnormality.    Electronically Signed    By: Markus Daft M.D.    On: 03/17/2014 20:14       Lumbar DG (Complete) 4+V:  Results for orders placed during the hospital encounter of 06/05/15  DG Lumbar Spine Complete   Narrative CLINICAL DATA:  Slipped in water and fell today  EXAM: LUMBAR SPINE - COMPLETE 4+ VIEW  COMPARISON:  03/17/2014  FINDINGS: Five views of lumbar spine submitted. No acute fracture or subluxation. Mild anterior  spurring upper endplate of L3 and L4 vertebral body. Mild disc space flattening at L5-S1 level. Alignment and vertebral body heights are preserved.  IMPRESSION: No acute fracture or subluxation. Mild degenerative changes as described above.   Electronically Signed   By: Lahoma Crocker M.D.   On: 06/05/2015 23:31    Note: Imaging results reviewed.  ROS  Cardiovascular History: {Hx; Cardiovascular History:210120525} Pulmonary or Respiratory History: {Hx; Pumonary and/or Respiratory History:210120523} Neurological History: {Hx; Neurological:210120504} Review of Past Neurological Studies:  Results for orders placed or performed during the hospital encounter of 11/09/15  CT HEAD WO CONTRAST   Narrative   CLINICAL DATA:  Right temporal headache for 8 hours.  Hypertension.  EXAM: CT HEAD WITHOUT CONTRAST  TECHNIQUE: Contiguous axial images were obtained from the base of the skull through the vertex without intravenous contrast.  COMPARISON:  02/17/2015  FINDINGS: Brain: Unremarkable. No evidence of acute infarction,  hemorrhage, hydrocephalus, or mass lesion/mass effect.  Vascular: No hyperdense vessel or unexpected calcification.  Skull: Negative for fracture or focal lesion.  Sinuses/Orbits: Anterior nasal septal perforation without neighboring mass or mucosal nodularity. No acute sinusitis. No acute finding.  Other: None.  IMPRESSION: 1. Normal intracranial imaging. 2. Nasal septum perforation.   Electronically Signed   By: Monte Fantasia M.D.   On: 11/09/2015 15:53    Psychological-Psychiatric History: {Hx; Psychological-Psychiatric History:210120512} Gastrointestinal History: {Hx; Gastrointestinal:210120527} Genitourinary History: {Hx; Genitourinary:210120506} Hematological History: {Hx; Hematological:210120510} Endocrine History: {Hx; Endocrine history:210120509} Rheumatologic History: {Hx; Rheumatological:210120530} Musculoskeletal History: {Hx; Musculoskeletal:210120528} Work History: {Hx; Work history:210120514}  Allergies  Ms. Finan is allergic to amoxicillin; aspirin; compazine; dilaudid [hydromorphone hcl]; penicillins; proton pump inhibitors; and zofran [ondansetron hcl].  Laboratory Chemistry  Inflammation Markers No results found for: ESRSEDRATE, CRP Renal Function Lab Results  Component Value Date   BUN 14 11/09/2015   CREATININE 0.80 11/09/2015   GFRAA >60 11/09/2015   GFRNONAA >60 11/09/2015   Hepatic Function Lab Results  Component Value Date   AST 22 02/17/2015   ALT 10 (L) 02/17/2015   ALBUMIN 3.6 02/17/2015   Electrolytes Lab Results  Component Value Date   NA 137 11/09/2015   K 4.0 11/09/2015   CL 106 11/09/2015   CALCIUM 8.9 11/09/2015   Pain Modulating Vitamins No results found for: Maralyn Sago [id number], [id number], [id number], 25OHVITD1, 25OHVITD2, 25OHVITD3, VITAMINB12 Coagulation Parameters Lab Results  Component Value Date   PLT 259 11/09/2015   Cardiovascular Lab Results  Component Value Date   HGB 14.0 11/09/2015   HCT 41.0  11/09/2015   Note: Lab results reviewed.  [place]  Drug: Ms. Laham  reports that she does not use drugs. Alcohol:  reports that she does not drink alcohol. Tobacco:  reports that she quit smoking about 2 years ago. She has never used smokeless tobacco. Medical:  has a past medical history of Anxiety; Arthritis; Asthma; Chronic right hip pain; Degenerative disc disease, thoracic; GERD (gastroesophageal reflux disease); Hypertension; Irritable bowel syndrome (IBS); and Scoliosis. Family: family history is not on file.  Past Surgical History:  Procedure Laterality Date  . ABDOMINAL HYSTERECTOMY    . CESAREAN SECTION  1985  . CHOLECYSTECTOMY    . TUBAL LIGATION     Active Ambulatory Problems    Diagnosis Date Noted  . Anxiety 04/11/2016  . Arthritis 04/11/2016  . Asthma 04/11/2016  . Chronic right hip pain 04/11/2016  . Degenerative disc disease, thoracic 04/11/2016  . GERD (gastroesophageal reflux disease) 04/11/2016  . Hypertension 04/11/2016  . Irritable bowel syndrome (IBS)  04/11/2016  . Scoliosis 04/11/2016  . Chronic pain syndrome 04/11/2016  . Chronic bilateral low back pain without sciatica 04/11/2016  . Long term prescription benzodiazepine use 04/11/2016  . Encounter for opiate analgesic use agreement 04/11/2016  . Long term current use of opiate analgesic 04/11/2016  . Long term prescription opiate use 04/11/2016  . Opiate use 04/11/2016  . Obesity, Class II, BMI 35-39.9 04/11/2016   Resolved Ambulatory Problems    Diagnosis Date Noted  . No Resolved Ambulatory Problems   Past Medical History:  Diagnosis Date  . Anxiety   . Arthritis   . Asthma   . Chronic right hip pain   . Degenerative disc disease, thoracic   . GERD (gastroesophageal reflux disease)   . Hypertension   . Irritable bowel syndrome (IBS)   . Scoliosis    Constitutional Exam  General appearance: Well nourished, well developed, and well hydrated. In no apparent acute distress There were no  vitals filed for this visit. BMI Assessment: Estimated body mass index is 34.67 kg/m as calculated from the following:   Height as of 03/20/16: '5\' 4"'  (1.626 m).   Weight as of 03/20/16: 202 lb (91.6 kg).  BMI interpretation table: BMI level Category Range association with higher incidence of chronic pain  <18 kg/m2 Underweight   18.5-24.9 kg/m2 Ideal body weight   25-29.9 kg/m2 Overweight Increased incidence by 20%  30-34.9 kg/m2 Obese (Class I) Increased incidence by 68%  35-39.9 kg/m2 Severe obesity (Class II) Increased incidence by 136%  >40 kg/m2 Extreme obesity (Class III) Increased incidence by 254%   BMI Readings from Last 4 Encounters:  03/20/16 34.67 kg/m  02/11/16 36.39 kg/m  11/09/15 36.56 kg/m  09/20/15 36.05 kg/m   Wt Readings from Last 4 Encounters:  03/20/16 202 lb (91.6 kg)  02/11/16 212 lb (96.2 kg)  11/09/15 213 lb (96.6 kg)  09/20/15 210 lb (95.3 kg)  Psych/Mental status: Alert, oriented x 3 (person, place, & time) Eyes: PERLA Respiratory: No evidence of acute respiratory distress  Cervical Spine Exam  Inspection: No masses, redness, or swelling Alignment: Symmetrical Functional ROM: Unrestricted ROM Stability: No instability detected Muscle strength & Tone: Functionally intact Sensory: Unimpaired Palpation: Non-contributory  Upper Extremity (UE) Exam    Side: Right upper extremity  Side: Left upper extremity  Inspection: No masses, redness, swelling, or asymmetry  Inspection: No masses, redness, swelling, or asymmetry  Functional ROM: Unrestricted ROM          Functional ROM: Unrestricted ROM          Muscle strength & Tone: Functionally intact  Muscle strength & Tone: Functionally intact  Sensory: Unimpaired  Sensory: Unimpaired  Palpation: Non-contributory  Palpation: Non-contributory   Thoracic Spine Exam  Inspection: No masses, redness, or swelling Alignment: Symmetrical Functional ROM: Unrestricted ROM Stability: No instability  detected Sensory: Unimpaired Muscle strength & Tone: Functionally intact Palpation: Non-contributory  Lumbar Spine Exam  Inspection: No masses, redness, or swelling Alignment: Symmetrical Functional ROM: Unrestricted ROM Stability: No instability detected Muscle strength & Tone: Functionally intact Sensory: Unimpaired Palpation: Non-contributory Provocative Tests: Lumbar Hyperextension and rotation test: evaluation deferred today       Patrick's Maneuver: evaluation deferred today              Gait & Posture Assessment  Ambulation: Unassisted Gait: Relatively normal for age and body habitus Posture: WNL   Lower Extremity Exam    Side: Right lower extremity  Side: Left lower extremity  Inspection: No masses, redness,  swelling, or asymmetry  Inspection: No masses, redness, swelling, or asymmetry  Functional ROM: Unrestricted ROM          Functional ROM: Unrestricted ROM          Muscle strength & Tone: Functionally intact  Muscle strength & Tone: Functionally intact  Sensory: Unimpaired  Sensory: Unimpaired  Palpation: Non-contributory  Palpation: Non-contributory   Assessment  Primary Diagnosis & Pertinent Problem List: The primary encounter diagnosis was Chronic pain syndrome. Diagnoses of Anxiety, Arthritis, Chronic right hip pain, Degenerative disc disease, thoracic, Chronic bilateral low back pain without sciatica, Long term prescription benzodiazepine use, Long term current use of opiate analgesic, Long term prescription opiate use, Opiate use, and Obesity, Class II, BMI 35-39.9 were also pertinent to this visit.  Visit Diagnosis: 1. Chronic pain syndrome   2. Anxiety   3. Arthritis   4. Chronic right hip pain   5. Degenerative disc disease, thoracic   6. Chronic bilateral low back pain without sciatica   7. Long term prescription benzodiazepine use   8. Long term current use of opiate analgesic   9. Long term prescription opiate use   10. Opiate use   11. Obesity,  Class II, BMI 35-39.9    Plan of Care  Initial treatment plan:  Please be advised that as per protocol, today's visit has been an evaluation only. We have not taken over the patient's controlled substance management.  Problem-specific plan: No problem-specific Assessment & Plan notes found for this encounter.  Ordered Lab-work, Procedure(s), Referral(s), & Consult(s): No orders of the defined types were placed in this encounter.  Pharmacotherapy: Medications ordered:  No orders of the defined types were placed in this encounter.  Medications administered during this visit: Ms. Tulloch had no medications administered during this visit.   Pharmacotherapy under consideration:  Opioid Analgesics: The patient was informed that there is no guarantee that she would be a candidate for opioid analgesics. The decision will be made following CDC guidelines. This decision will be based on the results of diagnostic studies, as well as Ms. Ocon's risk profile.  Membrane stabilizer: To be determined at a later time Muscle relaxant: To be determined at a later time NSAID: To be determined at a later time Other analgesic(s): To be determined at a later time   Interventional therapies under consideration: Ms. Omlor was informed that there is no guarantee that she would be a candidate for interventional therapies. The decision will be based on the results of diagnostic studies, as well as Ms. Ardis's risk profile.  Possible procedure(s): ***   Provider-requested follow-up: No Follow-up on file.  Future Appointments Date Time Provider [place]  04/12/2016 9:15 AM Milinda Pointer, MD [facility] None    Primary Care Physician: [facility] Location: [facility] Outpatient Pain Management Facility Note by: Kathlen Brunswick. Dossie Arbour, M.D, DABA, DABAPM, DABPM, DABIPP, FIPP  Pain Score Disclaimer: We use the NRS-11 scale. This is a self-reported, subjective measurement  of pain severity with only modest accuracy. It is used primarily to identify changes within a particular patient. It must be understood that outpatient pain scales are significantly less accurate that those used for research, where they can be applied under ideal controlled circumstances with minimal exposure to variables. In reality, the score is likely to be a combination of pain intensity and pain affect, where pain affect describes the degree of emotional arousal or changes in action readiness caused by the sensory experience of pain. Factors such as social and  work situation, setting, emotional state, anxiety levels, expectation, and prior pain experience may influence pain perception and show large inter-individual differences that may also be affected by time variables.  Patient instructions provided during this appointment: There are no Patient Instructions on file for this visit.

## 2016-04-12 ENCOUNTER — Ambulatory Visit: Payer: Medicaid Other | Admitting: Pain Medicine

## 2016-04-14 ENCOUNTER — Ambulatory Visit: Payer: Medicaid Other | Admitting: Pain Medicine

## 2016-04-28 ENCOUNTER — Ambulatory Visit
Admission: RE | Admit: 2016-04-28 | Discharge: 2016-04-28 | Disposition: A | Discharge status: Home / Self Care | Payer: Medicaid Other | Source: Ambulatory Visit | Attending: Nurse Practitioner | Admitting: Nurse Practitioner

## 2016-04-28 ENCOUNTER — Other Ambulatory Visit: Payer: Self-pay | Admitting: Nurse Practitioner

## 2016-04-28 DIAGNOSIS — R1032 Left lower quadrant pain: Secondary | ICD-10-CM | POA: Diagnosis not present

## 2016-04-28 DIAGNOSIS — R109 Unspecified abdominal pain: Secondary | ICD-10-CM

## 2016-05-30 ENCOUNTER — Encounter: Payer: Self-pay | Admitting: Emergency Medicine

## 2016-05-30 ENCOUNTER — Emergency Department
Admission: EM | Admit: 2016-05-30 | Discharge: 2016-05-30 | Disposition: A | Discharge status: Home / Self Care | Payer: Medicaid Other | Attending: Emergency Medicine | Admitting: Emergency Medicine

## 2016-05-30 DIAGNOSIS — J45909 Unspecified asthma, uncomplicated: Secondary | ICD-10-CM | POA: Diagnosis not present

## 2016-05-30 DIAGNOSIS — Z79899 Other long term (current) drug therapy: Secondary | ICD-10-CM | POA: Insufficient documentation

## 2016-05-30 DIAGNOSIS — M26622 Arthralgia of left temporomandibular joint: Secondary | ICD-10-CM | POA: Diagnosis not present

## 2016-05-30 DIAGNOSIS — Z87891 Personal history of nicotine dependence: Secondary | ICD-10-CM | POA: Diagnosis not present

## 2016-05-30 DIAGNOSIS — I10 Essential (primary) hypertension: Secondary | ICD-10-CM | POA: Diagnosis not present

## 2016-05-30 DIAGNOSIS — R6884 Jaw pain: Secondary | ICD-10-CM | POA: Diagnosis present

## 2016-05-30 MED ORDER — TRAMADOL HCL 50 MG PO TABS: 50.0000 mg | ORAL_TABLET | Freq: Four times a day (QID) | ORAL | 0 refills | Status: DC | PRN

## 2016-05-30 MED ORDER — MELOXICAM 7.5 MG PO TABS: 15.0000 mg | ORAL_TABLET | Freq: Once | ORAL | Status: DC

## 2016-05-30 MED ORDER — OXYCODONE-ACETAMINOPHEN 5-325 MG PO TABS
1.0000 | ORAL_TABLET | Freq: Once | ORAL | Status: AC
  Administered 2016-05-30: 1 via ORAL
  Filled 2016-05-30: qty 1

## 2016-05-30 NOTE — ED Triage Notes (Signed)
States she had a bad tooth on the left lower and is scheduled to see dentist on weds for same  But woke up with am with increased pain to left jaw area

## 2016-05-30 NOTE — ED Provider Notes (Signed)
[facility] Emergency Department Provider Note  ____________________________________________  Time seen: Approximately 6:36 PM  I have reviewed the triage vital signs and the nursing notes.   HISTORY  Chief Complaint Dental Pain    HPI Diane Warner is a 51 y.o. female presents to the emergency department with right sided jaw pain for one day. Patient states that she has a history of TMJ pain and this feels exactly like it has previously. Patient states that pain radiates from TMJ joint into cheek. Patient states she also has dental cavities and broken teeth on left side. Patient states that she sees the dentist on Wednesday for evaluation. Patient has taken ibuprofen, and used ice and heat for pain, which has not helped. Patient denies headache, dental pain, throat pain, ear pain, shortness of breath, chest pain, vomiting, abdominal pain.   Past Medical History:  Diagnosis Date  . Anxiety   . Arthritis   . Asthma   . Chronic right hip pain   . Degenerative disc disease, thoracic   . GERD (gastroesophageal reflux disease)   . Hypertension   . Irritable bowel syndrome (IBS)   . Scoliosis     Patient Active Problem List   Diagnosis Date Noted  . Anxiety 04/11/2016  . Arthritis 04/11/2016  . Asthma 04/11/2016  . Chronic right hip pain 04/11/2016  . Degenerative disc disease, thoracic 04/11/2016  . GERD (gastroesophageal reflux disease) 04/11/2016  . Hypertension 04/11/2016  . Irritable bowel syndrome (IBS) 04/11/2016  . Scoliosis 04/11/2016  . Chronic pain syndrome 04/11/2016  . Chronic bilateral low back pain without sciatica 04/11/2016  . Long term prescription benzodiazepine use 04/11/2016  . Encounter for opiate analgesic use agreement 04/11/2016  . Long term current use of opiate analgesic 04/11/2016  . Long term prescription opiate use 04/11/2016  . Opiate use 04/11/2016  . Obesity, Class II, BMI 35-39.9 04/11/2016    Past Surgical  History:  Procedure Laterality Date  . ABDOMINAL HYSTERECTOMY    . CESAREAN SECTION  1985  . CHOLECYSTECTOMY    . TUBAL LIGATION      Prior to Admission medications   Medication Sig Start Date End Date Taking? Authorizing Provider  albuterol (VENTOLIN HFA) 108 (90 BASE) MCG/ACT inhaler Inhale 2 puffs into the lungs every 6 (six) hours as needed.      Historical Provider, MD  Asenapine Maleate (SAPHRIS SL) Place 1 tablet under the tongue daily.      Historical Provider, MD  citalopram (CELEXA) 20 MG tablet Take 20 mg by mouth daily.      Historical Provider, MD  LORazepam (ATIVAN) 1 MG tablet Take 1 tablet (1 mg total) by mouth 2 (two) times daily as needed for anxiety. 11/09/15 11/08/16  Jennye MoccasinBrian S Quigley, MD  LORazepam (ATIVAN) 1 MG tablet Take 1 mg by mouth 2 (two) times daily.    Historical Provider, MD  predniSONE (DELTASONE) 10 MG tablet Take 1 tablet (10 mg total) by mouth daily. 6,5,4,3,2,1 six day taper 06/05/15   Evon Slackhomas C Gaines, PA-C  propranolol (INDERAL) 40 MG tablet Take 1 tablet (40 mg total) by mouth 2 (two) times daily. 09/20/15   Jennye MoccasinBrian S Quigley, MD  ranitidine (ZANTAC) 150 MG tablet Take 150 mg by mouth 2 (two) times daily.      Historical Provider, MD  traMADol (ULTRAM) 50 MG tablet Take 1 tablet (50 mg total) by mouth every 6 (six) hours as needed. 05/30/16 05/30/17  Enid DerryAshley Chamika Cunanan, PA-C    Allergies  Amoxicillin; Aspirin; Compazine; Dilaudid [hydromorphone hcl]; Penicillins; Proton pump inhibitors; and Zofran [ondansetron hcl]  No family history on file.  Social History Social History  Substance Use Topics  . Smoking status: Former Smoker    Quit date: 02/16/2014  . Smokeless tobacco: Never Used  . Alcohol use No     Review of Systems  Constitutional: No fever/chills ENT: No upper respiratory complaints. Cardiovascular: No chest pain. Respiratory: No cough. No SOB. Gastrointestinal: No abdominal pain.  No vomiting. Musculoskeletal: Negative for musculoskeletal  pain. Skin: Negative for rash, abrasions, lacerations, ecchymosis. Neurological: Negative for headaches, numbness or tingling   ____________________________________________   PHYSICAL EXAM:  VITAL SIGNS: ED Triage Vitals [05/30/16 1642]  Enc Vitals Group     BP (!) 152/86     Pulse Rate 88     Resp 18     Temp 98.4 F (36.9 C)     Temp Source Oral     SpO2 95 %     Weight 205 lb (93 kg)     Height 5\' 4"  (1.626 m)     Head Circumference      Peak Flow      Pain Score      Pain Loc      Pain Edu?      Excl. in GC?      Constitutional: Alert and oriented. Well appearing and in no acute distress. Eyes: Conjunctivae are normal. PERRL. EOMI. Head: Atraumatic. ENT:      Ears:      Nose: No congestion/rhinnorhea.      Mouth/Throat: Mucous membranes are moist. Poor dentition. Multiple cavities. Tenderness to palpation over left TMJ joint.  Neck: No stridor. No cervical spine tenderness to palpation. Hematological/Lymphatic/Immunilogical: No cervical lymphadenopathy. Cardiovascular: Normal rate, regular rhythm. Normal S1 and S2.  Good peripheral circulation. Respiratory: Normal respiratory effort without tachypnea or retractions. Lungs CTAB. Good air entry to the bases with no decreased or absent breath sounds. Musculoskeletal: Full range of motion to all extremities. No gross deformities appreciated. Neurologic:  Normal speech and language. No gross focal neurologic deficits are appreciated.  Skin:  Skin is warm, dry and intact. No rash noted. Psychiatric: Mood and affect are normal. Speech and behavior are normal. Patient exhibits appropriate insight and judgement.   ____________________________________________   LABS (all labs ordered are listed, but only abnormal results are displayed)  Labs Reviewed - No data to display ____________________________________________  EKG   ____________________________________________  RADIOLOGY   No results  found.  ____________________________________________    PROCEDURES  Procedure(s) performed:    Procedures    Medications  oxyCODONE-acetaminophen (PERCOCET/ROXICET) 5-325 MG per tablet 1 tablet (not administered)     ____________________________________________   INITIAL IMPRESSION / ASSESSMENT AND PLAN / ED COURSE  Pertinent labs & imaging results that were available during my care of the patient were reviewed by me and considered in my medical decision making (see chart for details).  Review of the [place] CSRS was performed in accordance of the [facility] prior to dispensing any controlled drugs.  Clinical Course     Patient's diagnosis is consistent with TMJ pain. Exam and vital signs are reassuring. Patient sees dentist on Wednesday for evaluation. Patient was given Percocet in the ED for pain. Patient has a driver that is present in room with patient. Patient will be given prescription for tramadol for pain. No indication for antibiotics at this point. Patient is to follow up with PCP and dentist as directed. Patient is given ED  precautions to return to the ED for any worsening or new symptoms.     ____________________________________________  FINAL CLINICAL IMPRESSION(S) / ED DIAGNOSES  Final diagnoses:  Arthralgia of left temporomandibular joint      NEW MEDICATIONS STARTED DURING THIS VISIT:  New Prescriptions   TRAMADOL (ULTRAM) 50 MG TABLET    Take 1 tablet (50 mg total) by mouth every 6 (six) hours as needed.        This chart was dictated using voice recognition software/Dragon. Despite best efforts to proofread, errors can occur which can change the meaning. Any change was purely unintentional.    Enid Derry, PA-C 05/30/16 1921    Sharman Cheek, MD 06/02/16 2007

## 2016-06-21 ENCOUNTER — Ambulatory Visit: Payer: Medicaid Other | Admitting: Pain Medicine

## 2016-07-05 ENCOUNTER — Ambulatory Visit: Payer: Medicaid Other | Admitting: Pain Medicine

## 2016-08-22 ENCOUNTER — Emergency Department: Payer: Medicaid Other

## 2016-08-22 ENCOUNTER — Emergency Department
Admission: EM | Admit: 2016-08-22 | Discharge: 2016-08-22 | Disposition: A | Discharge status: Home / Self Care | Payer: Medicaid Other | Attending: Emergency Medicine | Admitting: Emergency Medicine

## 2016-08-22 DIAGNOSIS — I10 Essential (primary) hypertension: Secondary | ICD-10-CM | POA: Insufficient documentation

## 2016-08-22 DIAGNOSIS — B349 Viral infection, unspecified: Secondary | ICD-10-CM | POA: Insufficient documentation

## 2016-08-22 DIAGNOSIS — Z79899 Other long term (current) drug therapy: Secondary | ICD-10-CM | POA: Insufficient documentation

## 2016-08-22 DIAGNOSIS — Z87891 Personal history of nicotine dependence: Secondary | ICD-10-CM | POA: Insufficient documentation

## 2016-08-22 DIAGNOSIS — J45909 Unspecified asthma, uncomplicated: Secondary | ICD-10-CM | POA: Insufficient documentation

## 2016-08-22 LAB — COMPREHENSIVE METABOLIC PANEL
ALBUMIN: 3.8 g/dL (ref 3.5–5.0)
ALT: 9 U/L — ABNORMAL LOW (ref 14–54)
AST: 18 U/L (ref 15–41)
Alkaline Phosphatase: 71 U/L (ref 38–126)
Anion gap: 6 (ref 5–15)
BUN: 14 mg/dL (ref 6–20)
CHLORIDE: 107 mmol/L (ref 101–111)
CO2: 25 mmol/L (ref 22–32)
Calcium: 8.7 mg/dL — ABNORMAL LOW (ref 8.9–10.3)
Creatinine, Ser: 0.79 mg/dL (ref 0.44–1.00)
GFR calc Af Amer: >60 mL/min (ref >60)
GFR calc non Af Amer: >60 mL/min (ref >60)
GLUCOSE: 112 mg/dL — AB (ref 65–99)
POTASSIUM: 3.5 mmol/L (ref 3.5–5.1)
Sodium: 138 mmol/L (ref 135–145)
Total Bilirubin: 0.5 mg/dL (ref 0.3–1.2)
Total Protein: 7.4 g/dL (ref 6.5–8.1)

## 2016-08-22 LAB — URINALYSIS, COMPLETE (UACMP) WITH MICROSCOPIC
BACTERIA UA: NONE SEEN
Bilirubin Urine: NEGATIVE
Glucose, UA: NEGATIVE mg/dL
KETONES UR: NEGATIVE mg/dL
Nitrite: NEGATIVE
PROTEIN: NEGATIVE mg/dL
Specific Gravity, Urine: 1.018 (ref 1.005–1.030)
pH: 7 (ref 5.0–8.0)

## 2016-08-22 LAB — TROPONIN I: Troponin I: <0.03 ng/mL (ref <0.03)

## 2016-08-22 LAB — CBC
HEMATOCRIT: 43.5 % (ref 35.0–47.0)
HEMOGLOBIN: 14.5 g/dL (ref 12.0–16.0)
MCH: 30.2 pg (ref 26.0–34.0)
MCHC: 33.4 g/dL (ref 32.0–36.0)
MCV: 90.3 fL (ref 80.0–100.0)
Platelets: 249 10*3/uL (ref 150–440)
RBC: 4.82 MIL/uL (ref 3.80–5.20)
RDW: 14.6 % — AB (ref 11.5–14.5)
WBC: 7.5 10*3/uL (ref 3.6–11.0)

## 2016-08-22 LAB — INFLUENZA PANEL BY PCR (TYPE A & B)
INFLAPCR: NEGATIVE
Influenza B By PCR: NEGATIVE

## 2016-08-22 LAB — LIPASE, BLOOD: LIPASE: 25 U/L (ref 11–51)

## 2016-08-22 MED ORDER — KETOROLAC TROMETHAMINE 30 MG/ML IJ SOLN
INTRAMUSCULAR | Status: AC
  Administered 2016-08-22: 15 mg via INTRAVENOUS
  Filled 2016-08-22: qty 1

## 2016-08-22 MED ORDER — METOCLOPRAMIDE HCL 5 MG/ML IJ SOLN
INTRAMUSCULAR | Status: AC
  Administered 2016-08-22: 10 mg via INTRAVENOUS
  Filled 2016-08-22: qty 2

## 2016-08-22 MED ORDER — KETOROLAC TROMETHAMINE 30 MG/ML IJ SOLN
15.0000 mg | Freq: Once | INTRAMUSCULAR | Status: AC
  Administered 2016-08-22: 15 mg via INTRAVENOUS

## 2016-08-22 MED ORDER — SODIUM CHLORIDE 0.9 % IV BOLUS (SEPSIS)
1000.0000 mL | Freq: Once | INTRAVENOUS | Status: AC
Start: 2016-08-22 — End: 2016-08-22
  Administered 2016-08-22: 1000 mL via INTRAVENOUS

## 2016-08-22 MED ORDER — METOCLOPRAMIDE HCL 10 MG PO TABS: 10.0000 mg | ORAL_TABLET | Freq: Three times a day (TID) | ORAL | 0 refills | Status: DC | PRN

## 2016-08-22 MED ORDER — METOCLOPRAMIDE HCL 5 MG/ML IJ SOLN
10.0000 mg | Freq: Once | INTRAMUSCULAR | Status: AC
  Administered 2016-08-22: 10 mg via INTRAVENOUS

## 2016-08-22 NOTE — ED Triage Notes (Signed)
Pt c/o cough with congestion since last Thursday. States today having LUQ pain with N/V/D.Marland Kitchen States she has been taking robitussin without relief.

## 2016-08-22 NOTE — ED Notes (Signed)
Pt states cough, chills and chest pain radiating to her back, states symptoms since Thursday, at present pt awake and alert in no acute distress

## 2016-08-22 NOTE — ED Provider Notes (Signed)
[facility] Emergency Department Provider Note  ____________________________________________  Time seen: Approximately 3:15 PM  I have reviewed the triage vital signs and the nursing notes.   HISTORY  Chief Complaint Cough and Abdominal Pain   HPI Diane Warner is a 51 y.o. female with a history of asthma, hypertension, and IBS who presents for flulike symptoms. Patient has had 4 days of body aches, headaches, chills, cough, vomiting, diarrhea. Patient reports that she's been having severe coughing fits and when she has though she develops tightness in her chest and shortness of breath. Chest pain and shortness of breath resolved when she stops coughing. She is a former smoker who stopped a year ago. She has been using her inhalers at home. She denies fever. She denies family history of ischemic heart disease. She works in a [employer], hospital. No melena, coffee-ground emesis, hematemesis, hematochezia. No abdominal pain, dysuria, hematuria.  Past Medical History:  Diagnosis Date  . Anxiety   . Arthritis   . Asthma   . Chronic right hip pain   . Degenerative disc disease, thoracic   . GERD (gastroesophageal reflux disease)   . Hypertension   . Irritable bowel syndrome (IBS)   . Scoliosis     Patient Active Problem List   Diagnosis Date Noted  . Anxiety 04/11/2016  . Arthritis 04/11/2016  . Asthma 04/11/2016  . Chronic right hip pain 04/11/2016  . Degenerative disc disease, thoracic 04/11/2016  . GERD (gastroesophageal reflux disease) 04/11/2016  . Hypertension 04/11/2016  . Irritable bowel syndrome (IBS) 04/11/2016  . Scoliosis 04/11/2016  . Chronic pain syndrome 04/11/2016  . Chronic bilateral low back pain without sciatica 04/11/2016  . Long term prescription benzodiazepine use 04/11/2016  . Encounter for opiate analgesic use agreement 04/11/2016  . Long term current use of opiate analgesic 04/11/2016  . Long term prescription  opiate use 04/11/2016  . Opiate use 04/11/2016  . Obesity, Class II, BMI 35-39.9 04/11/2016    Past Surgical History:  Procedure Laterality Date  . ABDOMINAL HYSTERECTOMY    . CESAREAN SECTION  1985  . CHOLECYSTECTOMY    . TUBAL LIGATION      Prior to Admission medications   Medication Sig Start Date End Date Taking? Authorizing Provider  albuterol (VENTOLIN HFA) 108 (90 BASE) MCG/ACT inhaler Inhale 2 puffs into the lungs every 6 (six) hours as needed.      Historical Provider, MD  Asenapine Maleate (SAPHRIS SL) Place 1 tablet under the tongue daily.      Historical Provider, MD  citalopram (CELEXA) 20 MG tablet Take 20 mg by mouth daily.      Historical Provider, MD  LORazepam (ATIVAN) 1 MG tablet Take 1 tablet (1 mg total) by mouth 2 (two) times daily as needed for anxiety. 11/09/15 11/08/16  Jennye Moccasin, MD  LORazepam (ATIVAN) 1 MG tablet Take 1 mg by mouth 2 (two) times daily.    Historical Provider, MD  metoCLOPramide (REGLAN) 10 MG tablet Take 1 tablet (10 mg total) by mouth every 8 (eight) hours as needed for nausea. 08/22/16 08/25/16  Nita Sickle, MD  predniSONE (DELTASONE) 10 MG tablet Take 1 tablet (10 mg total) by mouth daily. 6,5,4,3,2,1 six day taper 06/05/15   Evon Slack, PA-C  propranolol (INDERAL) 40 MG tablet Take 1 tablet (40 mg total) by mouth 2 (two) times daily. 09/20/15   Jennye Moccasin, MD  ranitidine (ZANTAC) 150 MG tablet Take 150 mg by mouth 2 (two)  times daily.      Historical Provider, MD  traMADol (ULTRAM) 50 MG tablet Take 1 tablet (50 mg total) by mouth every 6 (six) hours as needed. 05/30/16 05/30/17  Enid Derry, PA-C    Allergies Amoxicillin; Aspirin; Compazine; Dilaudid [hydromorphone hcl]; Penicillins; Proton pump inhibitors; and Zofran [ondansetron hcl]  No family history on file.  Social History Social History  Substance Use Topics  . Smoking status: Former Smoker    Quit date: 02/16/2014  . Smokeless tobacco: Never Used  . Alcohol  use No    Review of Systems  Constitutional: Negative for fever. + chills, body aches Eyes: Negative for visual changes. ENT: Negative for sore throat. Neck: No neck pain  Cardiovascular: + chest pain. Respiratory: + shortness of breath and cough Gastrointestinal: Negative for abdominal pain. + vomiting and diarrhea. Genitourinary: Negative for dysuria. Musculoskeletal: Negative for back pain. Skin: Negative for rash. Neurological: Negative for headaches, weakness or numbness. Psych: No SI or HI  ____________________________________________   PHYSICAL EXAM:  VITAL SIGNS: ED Triage Vitals [08/22/16 1356]  Enc Vitals Group     BP 132/84     Pulse Rate 87     Resp 18     Temp 98 F (36.7 C)     Temp Source Oral     SpO2 98 %     Weight 220 lb (99.8 kg)     Height  (1.626 m)     Head Circumference      Peak Flow      Pain Score 7     Pain Loc      Pain Edu?      Excl. in GC?     Constitutional: Alert and oriented. Well appearing and in no apparent distress. HEENT:      Head: Normocephalic and atraumatic.         Eyes: Conjunctivae are normal. Sclera is non-icteric. EOMI. PERRL      Mouth/Throat: Mucous membranes are moist.       Neck: Supple with no signs of meningismus. Cardiovascular: Regular rate and rhythm. No murmurs, gallops, or rubs. 2+ symmetrical distal pulses are present in all extremities. No JVD. Respiratory: Normal respiratory effort. Lungs are clear to auscultation bilaterally. No wheezes, crackles, or rhonchi.  Gastrointestinal: Soft, non tender, and non distended with positive bowel sounds. No rebound or guarding. Genitourinary: No CVA tenderness. Musculoskeletal: Nontender with normal range of motion in all extremities. No edema, cyanosis, or erythema of extremities. Neurologic: Normal speech and language. Face is symmetric. Moving all extremities. No gross focal neurologic deficits are appreciated. Skin: Skin is warm, dry and intact. No rash  noted. Psychiatric: Mood and affect are normal. Speech and behavior are normal.  ____________________________________________   LABS (all labs ordered are listed, but only abnormal results are displayed)  Labs Reviewed  COMPREHENSIVE METABOLIC PANEL - Abnormal; Notable for the following:       Result Value   Glucose, Bld 112 (*)    Calcium 8.7 (*)    ALT 9 (*)    All other components within normal limits  CBC - Abnormal; Notable for the following:    RDW 14.6 (*)    All other components within normal limits  URINALYSIS, COMPLETE (UACMP) WITH MICROSCOPIC - Abnormal; Notable for the following:    Color, Urine YELLOW (*)    APPearance HAZY (*)    Hgb urine dipstick SMALL (*)    Leukocytes, UA LARGE (*)    Squamous Epithelial / LPF  6-30 (*)    All other components within normal limits  URINE CULTURE  LIPASE, BLOOD  INFLUENZA PANEL BY PCR (TYPE A & B)  TROPONIN I   ____________________________________________  EKG  ED ECG REPORT I, Nita Sickle, the attending physician, personally viewed and interpreted this ECG.  Normal sinus rhythm, rate of 74, normal intervals, normal axis, no ST elevations or depressions, T-wave flattening in aVL. ____________________________________________  RADIOLOGY  CXR: Negative  ____________________________________________   PROCEDURES  Procedure(s) performed: None Procedures Critical Care performed:  None ____________________________________________   INITIAL IMPRESSION / ASSESSMENT AND PLAN / ED COURSE   51 y.o. female with a history of asthma, hypertension, and IBS who presents for flulike symptoms x 4 days. Patient is well-appearing, in no distress, normal vital signs, she has no wheezing and is moving great air. Chest x-ray with no evidence of pneumonia. Presentation concerning for flu versus a viral illness. We'll give IV fluids and Zofran for the nausea and vomiting, Toradol for the body aches. No evidence of an asthma  exacerbation at this time. UA with leuks and WBC but no bacteria and patient with no symptoms. Culture pending, will not treat until culture is back.  Clinical Course as of Aug 22 1557  Mon Aug 22, 2016  1556 Patient feels improved. Tolerating by mouth. Remains well appearing. She is currently discharged home on supportive care and close follow-up with primary care doctor.  [CV]    Clinical Course User Index [CV] Nita Sickle, MD    Pertinent labs & imaging results that were available during my care of the patient were reviewed by me and considered in my medical decision making (see chart for details).    ____________________________________________   FINAL CLINICAL IMPRESSION(S) / ED DIAGNOSES  Final diagnoses:  Viral illness      NEW MEDICATIONS STARTED DURING THIS VISIT:  New Prescriptions   METOCLOPRAMIDE (REGLAN) 10 MG TABLET    Take 1 tablet (10 mg total) by mouth every 8 (eight) hours as needed for nausea.     Note:  This document was prepared using Dragon voice recognition software and may include unintentional dictation errors.    Nita Sickle, MD 08/22/16 1600

## 2016-08-22 NOTE — Discharge Instructions (Signed)
Take reglan as needed for nausea or vomiting. Take 2 extra strength Tylenol every 8 hours for body aches. You may also take ibuprofen 600 mg every 8 hours for body aches. Drink plenty of fluids. Follow-up with your primary care doctor. Return to the emergency room if you're feeling dehydrated, have chest pain, wheezing, shortness of breath, or any new symptoms concerning to you.

## 2016-08-23 LAB — URINE CULTURE

## 2016-09-15 ENCOUNTER — Encounter: Payer: Self-pay | Admitting: *Deleted

## 2016-09-15 ENCOUNTER — Emergency Department
Admission: EM | Admit: 2016-09-15 | Discharge: 2016-09-15 | Disposition: A | Discharge status: Home / Self Care | Payer: Medicaid Other | Attending: Emergency Medicine | Admitting: Emergency Medicine

## 2016-09-15 DIAGNOSIS — G8929 Other chronic pain: Secondary | ICD-10-CM | POA: Insufficient documentation

## 2016-09-15 DIAGNOSIS — M25552 Pain in left hip: Secondary | ICD-10-CM | POA: Insufficient documentation

## 2016-09-15 DIAGNOSIS — Z79899 Other long term (current) drug therapy: Secondary | ICD-10-CM | POA: Insufficient documentation

## 2016-09-15 DIAGNOSIS — Z87891 Personal history of nicotine dependence: Secondary | ICD-10-CM | POA: Insufficient documentation

## 2016-09-15 DIAGNOSIS — J45909 Unspecified asthma, uncomplicated: Secondary | ICD-10-CM | POA: Insufficient documentation

## 2016-09-15 DIAGNOSIS — I10 Essential (primary) hypertension: Secondary | ICD-10-CM | POA: Insufficient documentation

## 2016-09-15 LAB — URINALYSIS, COMPLETE (UACMP) WITH MICROSCOPIC
BACTERIA UA: NONE SEEN
Bilirubin Urine: NEGATIVE
Glucose, UA: NEGATIVE mg/dL
HGB URINE DIPSTICK: NEGATIVE
Ketones, ur: NEGATIVE mg/dL
NITRITE: NEGATIVE
Protein, ur: NEGATIVE mg/dL
SPECIFIC GRAVITY, URINE: 1.028 (ref 1.005–1.030)
pH: 5 (ref 5.0–8.0)

## 2016-09-15 MED ORDER — OXYCODONE-ACETAMINOPHEN 5-325 MG PO TABS
1.0000 | ORAL_TABLET | Freq: Once | ORAL | Status: AC
  Administered 2016-09-15: 1 via ORAL

## 2016-09-15 MED ORDER — LIDOCAINE 5 % EX PTCH
1.0000 | MEDICATED_PATCH | CUTANEOUS | Status: DC
  Administered 2016-09-15: 1 via TRANSDERMAL

## 2016-09-15 MED ORDER — LIDOCAINE 5 % EX PTCH: 1.0000 | MEDICATED_PATCH | Freq: Two times a day (BID) | CUTANEOUS | 0 refills | Status: AC

## 2016-09-15 MED ORDER — KETOROLAC TROMETHAMINE 60 MG/2ML IM SOLN
30.0000 mg | Freq: Once | INTRAMUSCULAR | Status: AC
  Administered 2016-09-15: 30 mg via INTRAMUSCULAR

## 2016-09-15 MED ORDER — DICLOFENAC SODIUM 1 % TD GEL: 2.0000 g | Freq: Four times a day (QID) | TRANSDERMAL | 0 refills | Status: DC

## 2016-09-15 NOTE — ED Triage Notes (Signed)
FIRST NURSE: pt ambulatory to stat desk with reports of hip pain. NAD. Pt ambulates  without difficulty.

## 2016-09-15 NOTE — ED Provider Notes (Signed)
[facility] Emergency Department Provider Note  ____________________________________________  Time seen: Approximately 3:20 PM  I have reviewed the triage vital signs and the nursing notes.   HISTORY  Chief Complaint Hip Pain    HPI Diane Warner is a 51 y.o. female that presents to emergency department with chronic left hip pain that has worsened over the last day. Pain starts on the side of her hip and radiates to the front of her thigh. Patient states that pain is primarily with walking. Sshe has a tear in her muscles in her buttocks and hip. She brought her paperwork with her. She was supposed to have surgery with Dr. Truett Perna several months ago but lost her insurance and they would not do the surgery. Her PCP was trying to get her into charity care at [facility] but they do not have a Careers adviser but is willing to do her surgery. She is trying to get into pain management at [facility]. No new injury. She states that she has also been urinating more than normal. She is out of her pain medication.  Patient denies shortness of breath, chest pain, nausea, vomiting, abdominal pain, back pain, numbness, tingling, calf pain.   Past Medical History:  Diagnosis Date  . Anxiety   . Arthritis   . Asthma   . Chronic right hip pain   . Degenerative disc disease, thoracic   . GERD (gastroesophageal reflux disease)   . Hypertension   . Irritable bowel syndrome (IBS)   . Scoliosis     Patient Active Problem List   Diagnosis Date Noted  . Anxiety 04/11/2016  . Arthritis 04/11/2016  . Asthma 04/11/2016  . Chronic right hip pain 04/11/2016  . Degenerative disc disease, thoracic 04/11/2016  . GERD (gastroesophageal reflux disease) 04/11/2016  . Hypertension 04/11/2016  . Irritable bowel syndrome (IBS) 04/11/2016  . Scoliosis 04/11/2016  . Chronic pain syndrome 04/11/2016  . Chronic bilateral low back pain without sciatica 04/11/2016  . Long term prescription benzodiazepine use  04/11/2016  . Encounter for opiate analgesic use agreement 04/11/2016  . Long term current use of opiate analgesic 04/11/2016  . Long term prescription opiate use 04/11/2016  . Opiate use 04/11/2016  . Obesity, Class II, BMI 35-39.9 04/11/2016    Past Surgical History:  Procedure Laterality Date  . ABDOMINAL HYSTERECTOMY    . CESAREAN SECTION  1985  . CHOLECYSTECTOMY    . TUBAL LIGATION      Prior to Admission medications   Medication Sig Start Date End Date Taking? Authorizing Provider  albuterol (VENTOLIN HFA) 108 (90 BASE) MCG/ACT inhaler Inhale 2 puffs into the lungs every 6 (six) hours as needed.      Historical Provider, MD  Asenapine Maleate (SAPHRIS SL) Place 1 tablet under the tongue daily.      Historical Provider, MD  citalopram (CELEXA) 20 MG tablet Take 20 mg by mouth daily.      Historical Provider, MD  diclofenac sodium (VOLTAREN) 1 % GEL Apply 2 g topically 4 (four) times daily. 09/15/16   Enid Derry, PA-C  lidocaine (LIDODERM) 5 % Place 1 patch onto the skin every 12 (twelve) hours. Remove & Discard patch within 12 hours or as directed by MD 09/15/16 09/15/17  Enid Derry, PA-C  LORazepam (ATIVAN) 1 MG tablet Take 1 tablet (1 mg total) by mouth 2 (two) times daily as needed for anxiety. 11/09/15 11/08/16  Jennye Moccasin, MD  LORazepam (ATIVAN) 1 MG tablet Take 1 mg by mouth  2 (two) times daily.    Historical Provider, MD  propranolol (INDERAL) 40 MG tablet Take 1 tablet (40 mg total) by mouth 2 (two) times daily. 09/20/15   Jennye Moccasin, MD  ranitidine (ZANTAC) 150 MG tablet Take 150 mg by mouth 2 (two) times daily.      Historical Provider, MD    Allergies Amoxicillin; Aspirin; Compazine; Dilaudid [hydromorphone hcl]; Penicillins; Proton pump inhibitors; and Zofran [ondansetron hcl]  History reviewed. No pertinent family history.  Social History Social History  Substance Use Topics  . Smoking status: Former Smoker    Quit date: 02/16/2014  . Smokeless tobacco:  Never Used  . Alcohol use No     Review of Systems  Constitutional: No fever/chills ENT: No upper respiratory complaints. Cardiovascular: No chest pain. Respiratory: No SOB. Gastrointestinal: No abdominal pain.  No nausea, no vomiting.  Genitourinary: Negative for dysuria. Musculoskeletal: Positive for hip pain. Negative for back pain. Skin: Negative for rash, abrasions, lacerations, ecchymosis. Neurological: Negative for headaches, numbness or tingling   ____________________________________________   PHYSICAL EXAM:  VITAL SIGNS: ED Triage Vitals  Enc Vitals Group     BP 09/15/16 1347 (!) 154/98     Pulse Rate 09/15/16 1347 83     Resp 09/15/16 1347 16     Temp 09/15/16 1347 98.4 F (36.9 C)     Temp Source 09/15/16 1347 Oral     SpO2 09/15/16 1347 99 %     Weight 09/15/16 1348 220 lb (99.8 kg)     Height 09/15/16 1348 5\' 4"  (1.626 m)     Head Circumference --      Peak Flow --      Pain Score 09/15/16 1346 10     Pain Loc --      Pain Edu? --      Excl. in GC? --      Constitutional: Alert and oriented. Well appearing and in no acute distress. Eyes: Conjunctivae are normal. PERRL. EOMI. Head: Atraumatic. ENT:      Ears:      Nose: No congestion/rhinnorhea.      Mouth/Throat: Mucous membranes are moist.  Neck: No stridor.  Cardiovascular: Normal rate, regular rhythm.  Good peripheral circulation. Respiratory: Normal respiratory effort without tachypnea or retractions. Lungs CTAB. Good air entry to the bases with no decreased or absent breath sounds. Gastrointestinal: Bowel sounds 4 quadrants. Soft and nontender to palpation. No guarding or rigidity. No palpable masses. No distention. No CVA tenderness. Musculoskeletal: Full range of motion to all extremities. No gross deformities appreciated.No tenderness to palpation over lumbar spine. Tenderness to palpation over left hip. Full range of motion of left hip. No swelling or erythema. Neurologic:  Normal speech  and language. No gross focal neurologic deficits are appreciated.  Skin:  Skin is warm, dry and intact. No rash noted.   ____________________________________________   LABS (all labs ordered are listed, but only abnormal results are displayed)  Labs Reviewed  URINALYSIS, COMPLETE (UACMP) WITH MICROSCOPIC - Abnormal; Notable for the following:       Result Value   Color, Urine YELLOW (*)    APPearance CLOUDY (*)    Leukocytes, UA SMALL (*)    Squamous Epithelial / LPF 0-5 (*)    All other components within normal limits  URINE CULTURE   ____________________________________________  EKG   ____________________________________________  RADIOLOGY  No results found.  ____________________________________________    PROCEDURES  Procedure(s) performed:    Procedures    Medications  lidocaine (LIDODERM) 5 % 1 patch (1 patch Transdermal Patch Applied 09/15/16 1631)  oxyCODONE-acetaminophen (PERCOCET/ROXICET) 5-325 MG per tablet 1 tablet (1 tablet Oral Given 09/15/16 1529)  ketorolac (TORADOL) injection 30 mg (30 mg Intramuscular Given 09/15/16 1632)     ____________________________________________   INITIAL IMPRESSION / ASSESSMENT AND PLAN / ED COURSE  Pertinent labs & imaging results that were available during my care of the patient were reviewed by me and considered in my medical decision making (see chart for details).  Review of the  CSRS was performed in accordance of the [place] prior to dispensing any controlled drugs.     Patient's diagnosis is consistent with gluteus maximus tear. Vital signs and exam are reassuring. No indication of infection on urinalysis but this will be sent for culture. Patient brought MRI results and orthopedic recommendations with her. Patient was supposed to have surgery several months ago for full muscle tear but did not have surgery due to losing insurance. Patient recently ran out of her pain medication and I think this is likely why  symptoms have worsened today. She is walking in the ED without difficulty. Patient was given Percocet, Toradol shot, lidocaine patch in ED. I explained to her that I was not going to prescribe her narcotics and that she was going to need to follow up with orthopedics or pain management for long term pain medication. Patient is given ED precautions to return to the ED for any worsening or new symptoms.     ____________________________________________  FINAL CLINICAL IMPRESSION(S) / ED DIAGNOSES  Final diagnoses:  Left hip pain      NEW MEDICATIONS STARTED DURING THIS VISIT:  Discharge Medication List as of 09/15/2016  4:35 PM    START taking these medications   Details  diclofenac sodium (VOLTAREN) 1 % GEL Apply 2 g topically 4 (four) times daily., Starting Thu 09/15/2016, Print    lidocaine (LIDODERM) 5 % Place 1 patch onto the skin every 12 (twelve) hours. Remove & Discard patch within 12 hours or as directed by MD, Starting Thu 09/15/2016, Until Fri 09/15/2017, Print            This chart was dictated using voice recognition software/Dragon. Despite best efforts to proofread, errors can occur which can change the meaning. Any change was purely unintentional.    Enid DerryAshley Iva Posten, PA-C 09/15/16 1859    Sharman CheekPhillip Stafford, MD 09/15/16 2252

## 2016-09-15 NOTE — ED Triage Notes (Signed)
Pt presents to ED reporting severe left sided hip pain. Pt had  Dx left gluteus and anterior medius tendon tear ob 04/01/2016 that Dr. Milbert CoulterMerz was going to perform surgery on. Pt verbalized she lost her insurance and they would no longer perform surgery. Pt reports pain has increased today and reports having heat pad and naprosyn has not helped the pain.

## 2016-09-15 NOTE — ED Notes (Signed)
See triage notes   States she is having severe pain to left hip/leg  Hx of tendon tear  Was scheduled for surgery but lost her ins   States pain became worse last pm

## 2016-09-16 LAB — URINE CULTURE: Culture: <10000 — AB

## 2016-10-17 ENCOUNTER — Emergency Department: Payer: Self-pay

## 2016-10-17 ENCOUNTER — Encounter: Payer: Self-pay | Admitting: Emergency Medicine

## 2016-10-17 ENCOUNTER — Emergency Department
Admission: EM | Admit: 2016-10-17 | Discharge: 2016-10-17 | Disposition: A | Discharge status: Home / Self Care | Payer: Self-pay | Attending: Emergency Medicine | Admitting: Emergency Medicine

## 2016-10-17 DIAGNOSIS — Z87891 Personal history of nicotine dependence: Secondary | ICD-10-CM | POA: Insufficient documentation

## 2016-10-17 DIAGNOSIS — I10 Essential (primary) hypertension: Secondary | ICD-10-CM | POA: Insufficient documentation

## 2016-10-17 DIAGNOSIS — Z79899 Other long term (current) drug therapy: Secondary | ICD-10-CM | POA: Insufficient documentation

## 2016-10-17 DIAGNOSIS — J209 Acute bronchitis, unspecified: Secondary | ICD-10-CM | POA: Insufficient documentation

## 2016-10-17 DIAGNOSIS — J45909 Unspecified asthma, uncomplicated: Secondary | ICD-10-CM | POA: Insufficient documentation

## 2016-10-17 MED ORDER — IPRATROPIUM-ALBUTEROL 0.5-2.5 (3) MG/3ML IN SOLN
3.0000 mL | Freq: Once | RESPIRATORY_TRACT | Status: AC
  Administered 2016-10-17: 3 mL via RESPIRATORY_TRACT
  Filled 2016-10-17: qty 3

## 2016-10-17 MED ORDER — HYDROXYZINE HCL 10 MG/5ML PO SYRP
10.0000 mg | ORAL_SOLUTION | Freq: Once | ORAL | Status: AC
  Administered 2016-10-17: 10 mg via ORAL
  Filled 2016-10-17: qty 5

## 2016-10-17 MED ORDER — BENZONATATE 100 MG PO CAPS
200.0000 mg | ORAL_CAPSULE | Freq: Once | ORAL | Status: AC
  Administered 2016-10-17: 200 mg via ORAL
  Filled 2016-10-17: qty 2

## 2016-10-17 MED ORDER — PREDNISONE 10 MG (21) PO TBPK: ORAL_TABLET | Freq: Every day | ORAL | 0 refills | Status: DC

## 2016-10-17 MED ORDER — HYDROXYZINE HCL 10 MG PO TABS: 10.0000 mg | ORAL_TABLET | Freq: Three times a day (TID) | ORAL | 0 refills | Status: AC | PRN

## 2016-10-17 MED ORDER — ALBUTEROL SULFATE HFA 108 (90 BASE) MCG/ACT IN AERS: 2.0000 | INHALATION_SPRAY | Freq: Four times a day (QID) | RESPIRATORY_TRACT | 2 refills | Status: DC | PRN

## 2016-10-17 MED ORDER — AZITHROMYCIN 250 MG PO TABS: ORAL_TABLET | ORAL | 0 refills | Status: AC

## 2016-10-17 MED ORDER — METHYLPREDNISOLONE SODIUM SUCC 125 MG IJ SOLR
125.0000 mg | Freq: Once | INTRAMUSCULAR | Status: AC
  Administered 2016-10-17: 125 mg via INTRAMUSCULAR
  Filled 2016-10-17: qty 2

## 2016-10-17 NOTE — ED Provider Notes (Signed)
Lurline IdolI, Dawon Troop, attending physician, personally viewed and interpreted this EKG  EKG Time: 1659 Rate: 93 Rhythm: normal sinus rhythm Axis: normal Intervals: qtc 457 QRS: narrow ST changes: no st elevation Impression: normal ekg    Phineas SemenGoodman, Iann Rodier, MD 10/17/16 1707

## 2016-10-17 NOTE — ED Triage Notes (Signed)
Cough, fever and body aches x 2 days.  

## 2016-10-17 NOTE — ED Notes (Signed)
Pt with Xray. Provider aware of pt's vomiting. Will give medications and reassess

## 2016-10-17 NOTE — ED Provider Notes (Signed)
[facility] Emergency Department Provider Note  ____________________________________________  Time seen: Approximately 4:40 PM  I have reviewed the triage vital signs and the nursing notes.   HISTORY  Chief Complaint Cough    HPI Diane Warner is a 51 y.o. female presenting to the emergency department with nonproductive cough, shortness of breath, fatigue and myalgias for the past two weeks. Patient states that cough is keeping her from resting effectively. Patient also states that she has chest tightness. She states that she has been coughing so hard that it is making her vomit. Posttussive emesis noted in the emergency department. She denies associated chest pain, hemoptysis and abdominal pain. Patient states that she has had fever. However, she has not evaluated her temperature. No alleviating measures have been attempted.   Past Medical History:  Diagnosis Date  . Anxiety   . Arthritis   . Asthma   . Chronic right hip pain   . Degenerative disc disease, thoracic   . GERD (gastroesophageal reflux disease)   . Hypertension   . Irritable bowel syndrome (IBS)   . Scoliosis     Patient Active Problem List   Diagnosis Date Noted  . Anxiety 04/11/2016  . Arthritis 04/11/2016  . Asthma 04/11/2016  . Chronic right hip pain 04/11/2016  . Degenerative disc disease, thoracic 04/11/2016  . GERD (gastroesophageal reflux disease) 04/11/2016  . Hypertension 04/11/2016  . Irritable bowel syndrome (IBS) 04/11/2016  . Scoliosis 04/11/2016  . Chronic pain syndrome 04/11/2016  . Chronic bilateral low back pain without sciatica 04/11/2016  . Long term prescription benzodiazepine use 04/11/2016  . Encounter for opiate analgesic use agreement 04/11/2016  . Long term current use of opiate analgesic 04/11/2016  . Long term prescription opiate use 04/11/2016  . Opiate use 04/11/2016  . Obesity, Class II, BMI 35-39.9 04/11/2016    Past Surgical History:   Procedure Laterality Date  . ABDOMINAL HYSTERECTOMY    . CESAREAN SECTION  1985  . CHOLECYSTECTOMY    . TUBAL LIGATION      Prior to Admission medications   Medication Sig Start Date End Date Taking? Authorizing Provider  albuterol (PROVENTIL HFA;VENTOLIN HFA) 108 (90 Base) MCG/ACT inhaler Inhale 2 puffs into the lungs every 6 (six) hours as needed for wheezing or shortness of breath. 10/17/16   Orvil Feil, PA-C  Asenapine Maleate (SAPHRIS SL) Place 1 tablet under the tongue daily.      [provider]  azithromycin (ZITHROMAX Z-PAK) 250 MG tablet Take 2 tablets (500 mg) on  Day 1,  followed by 1 tablet (250 mg) once daily on Days 2 through 5. 10/17/16 10/22/16  Orvil Feil, PA-C  citalopram (CELEXA) 20 MG tablet Take 20 mg by mouth daily.      [provider]  diclofenac sodium (VOLTAREN) 1 % GEL Apply 2 g topically 4 (four) times daily. 09/15/16   Enid Derry, PA-C  hydrOXYzine (ATARAX/VISTARIL) 10 MG tablet Take 1 tablet (10 mg total) by mouth every 8 (eight) hours as needed. 10/17/16 10/22/16  Orvil Feil, PA-C  lidocaine (LIDODERM) 5 % Place 1 patch onto the skin every 12 (twelve) hours. Remove & Discard patch within 12 hours or as directed by MD 09/15/16 09/15/17  Enid Derry, PA-C  LORazepam (ATIVAN) 1 MG tablet Take 1 tablet (1 mg total) by mouth 2 (two) times daily as needed for anxiety. 11/09/15 11/08/16  Jennye Moccasin, MD  LORazepam (ATIVAN) 1 MG tablet Take 1 mg by mouth  2 (two) times daily.    [provider]  predniSONE (STERAPRED UNI-PAK 21 TAB) 10 MG (21) TBPK tablet Take by mouth daily. Take 6 tablets the first day, take 5 tablets the second day, take 4 tablets the third day, take 3 tablets the fourth day, take 2 tablets the fifth day, take 1 tablet the sixth day. 10/17/16 10/23/16  Orvil Feil, PA-C  propranolol (INDERAL) 40 MG tablet Take 1 tablet (40 mg total) by mouth 2 (two) times daily. 09/20/15   Jennye Moccasin, MD  ranitidine (ZANTAC) 150  MG tablet Take 150 mg by mouth 2 (two) times daily.      [provider]    Allergies Amoxicillin; Aspirin; Compazine; Dilaudid [hydromorphone hcl]; Penicillins; Proton pump inhibitors; and Zofran [ondansetron hcl]  No family history on file.  Social History Social History  Substance Use Topics  . Smoking status: Former Smoker    Quit date: 02/16/2014  . Smokeless tobacco: Never Used  . Alcohol use No     Review of Systems  Constitutional: Patient has had fever.  Eyes: No visual changes. No discharge ENT: Patient has had congestion.  Cardiovascular: patient has had chest tightness Respiratory: Patient has had non-productive cough.  Patient has had shortness of breath. Gastrointestinal: patient has had posttussive emesis Genitourinary: Negative for dysuria. No hematuria Musculoskeletal: Patient has had myalgias. Skin: Negative for rash, abrasions, lacerations, ecchymosis. Neurological: Negative for headaches, focal weakness or numbness.    ____________________________________________   PHYSICAL EXAM:  VITAL SIGNS: ED Triage Vitals  Enc Vitals Group     BP 10/17/16 1603 129/90     Pulse Rate 10/17/16 1603 99     Resp 10/17/16 1603 20     Temp 10/17/16 1603 98.1 F (36.7 C)     Temp Source 10/17/16 1603 Oral     SpO2 10/17/16 1603 98 %     Weight 10/17/16 1604 220 lb (99.8 kg)     Height 10/17/16 1604 5\' 4"  (1.626 m)     Head Circumference --      Peak Flow --      Pain Score 10/17/16 1603 10     Pain Loc --      Pain Edu? --      Excl. in GC? --     Constitutional: Alert and oriented. Patient is lying supine in bed.  Eyes: Conjunctivae are normal. PERRL. EOMI. Head: Atraumatic. ENT:      Ears: Tympanic membranes are injected bilaterally without evidence of effusion or purulent exudate. Bony landmarks are visualized bilaterally. No pain with palpation at the tragus.      Nose: Nasal turbinates are edematous and erythematous. Copious rhinorrhea  visualized.      Mouth/Throat: Mucous membranes are moist. Posterior pharynx is mildly erythematous. No tonsillar hypertrophy or purulent exudate. Uvula is midline. Neck: Full range of motion. No pain is elicited with flexion at the neck. Hematological/Lymphatic/Immunilogical: No cervical lymphadenopathy. Cardiovascular: Normal rate, regular rhythm. Normal S1 and S2.  Good peripheral circulation. Respiratory: Normal respiratory effort without tachypnea or retractions. Wheezing auscultated bilaterally. Wheezing improved to auscultation after DuoNeb treatment. Good air entry to the bases with no decreased or absent breath sounds. Gastrointestinal: Bowel sounds 4 quadrants. Soft and nontender to palpation. No guarding or rigidity. No palpable masses. No distention. No CVA tenderness.  Skin:  Skin is warm, dry and intact. No rash noted. Psychiatric: Mood and affect are normal. Speech and behavior are normal. Patient exhibits appropriate insight and judgement.  ____________________________________________   LABS (all labs ordered are listed, but only abnormal results are displayed)  Labs Reviewed - No data to display ____________________________________________  EKG   ____________________________________________  RADIOLOGY Geraldo PitterI, Lakedra Washington M Tanaja Ganger, personally viewed and evaluated these images (plain radiographs) as part of my medical decision making, as well as reviewing the written report by the radiologist.    Dg Chest 2 View  Result Date: 10/17/2016 CLINICAL DATA:  Productive cough for 2 weeks, chills for 1 week, nausea and vomiting for 2 days, history asthma, GERD, hypertension, former smoker EXAM: CHEST  2 VIEW COMPARISON:  08/22/2016 FINDINGS: Normal heart size, mediastinal contours, and pulmonary vascularity. Minimal peribronchial thickening. No pulmonary infiltrate, pleural effusion, or pneumothorax. Bones unremarkable. IMPRESSION: Minimal bronchitic changes without infiltrate.  Electronically Signed   By: Ulyses SouthwardMark  Boles M.D.   On: 10/17/2016 17:01    ____________________________________________    PROCEDURES  Procedure(s) performed:    Procedures    Medications  hydrOXYzine (ATARAX) 10 MG/5ML syrup 10 mg (not administered)  ipratropium-albuterol (DUONEB) 0.5-2.5 (3) MG/3ML nebulizer solution 3 mL (3 mLs Nebulization Given 10/17/16 1714)  benzonatate (TESSALON) capsule 200 mg (200 mg Oral Given 10/17/16 1643)  methylPREDNISolone sodium succinate (SOLU-MEDROL) 125 mg/2 mL injection 125 mg (125 mg Intramuscular Given 10/17/16 1715)     ____________________________________________   INITIAL IMPRESSION / ASSESSMENT AND PLAN / ED COURSE  Pertinent labs & imaging results that were available during my care of the patient were reviewed by me and considered in my medical decision making (see chart for details).  Review of the [place] CSRS was performed in accordance of the [facility] prior to dispensing any controlled drugs.     Assessment and plan Acute bronchitis: Patient presents to the emergency department with a nonproductive cough that is disturbing her activities of daily living, shortness of breath, fatigue and myalgias. DG chest reveals findings consistent with acute bronchitis. Patient was given an injection of Solu-Medrol in the emergency department. She was discharged with azithromycin and tapered Prednisone. Patient was also prescribed a refill on her albuterol inhaler. Vital signs are reassuring prior to discharge. Patient was advised to follow-up with her PCP in one week. All patient questions were answered.   ____________________________________________  FINAL CLINICAL IMPRESSION(S) / ED DIAGNOSES  Final diagnoses:  Acute bronchitis, unspecified organism      NEW MEDICATIONS STARTED DURING THIS VISIT:  New Prescriptions   ALBUTEROL (PROVENTIL HFA;VENTOLIN HFA) 108 (90 BASE) MCG/ACT INHALER    Inhale 2 puffs into the lungs every 6 (six) hours as  needed for wheezing or shortness of breath.   AZITHROMYCIN (ZITHROMAX Z-PAK) 250 MG TABLET    Take 2 tablets (500 mg) on  Day 1,  followed by 1 tablet (250 mg) once daily on Days 2 through 5.   HYDROXYZINE (ATARAX/VISTARIL) 10 MG TABLET    Take 1 tablet (10 mg total) by mouth every 8 (eight) hours as needed.   PREDNISONE (STERAPRED UNI-PAK 21 TAB) 10 MG (21) TBPK TABLET    Take by mouth daily. Take 6 tablets the first day, take 5 tablets the second day, take 4 tablets the third day, take 3 tablets the fourth day, take 2 tablets the fifth day, take 1 tablet the sixth day.        This chart was dictated using voice recognition software/Dragon. Despite best efforts to proofread, errors can occur which can change the meaning. Any change was purely unintentional.    Orvil FeilWoods, Tyris Eliot M, PA-C 10/17/16 1753  Emily Filbert, MD 10/17/16 2007

## 2016-10-19 ENCOUNTER — Emergency Department: Payer: Self-pay

## 2016-10-19 ENCOUNTER — Encounter: Payer: Self-pay | Admitting: Emergency Medicine

## 2016-10-19 ENCOUNTER — Emergency Department
Admission: EM | Admit: 2016-10-19 | Discharge: 2016-10-19 | Disposition: A | Discharge status: Home / Self Care | Payer: Self-pay | Attending: Emergency Medicine | Admitting: Emergency Medicine

## 2016-10-19 DIAGNOSIS — Z87891 Personal history of nicotine dependence: Secondary | ICD-10-CM | POA: Insufficient documentation

## 2016-10-19 DIAGNOSIS — J45909 Unspecified asthma, uncomplicated: Secondary | ICD-10-CM | POA: Insufficient documentation

## 2016-10-19 DIAGNOSIS — Z79899 Other long term (current) drug therapy: Secondary | ICD-10-CM | POA: Insufficient documentation

## 2016-10-19 DIAGNOSIS — J209 Acute bronchitis, unspecified: Secondary | ICD-10-CM | POA: Insufficient documentation

## 2016-10-19 DIAGNOSIS — I10 Essential (primary) hypertension: Secondary | ICD-10-CM | POA: Insufficient documentation

## 2016-10-19 LAB — BASIC METABOLIC PANEL
ANION GAP: 9 (ref 5–15)
BUN: 13 mg/dL (ref 6–20)
CALCIUM: 8.6 mg/dL — AB (ref 8.9–10.3)
CO2: 26 mmol/L (ref 22–32)
Chloride: 106 mmol/L (ref 101–111)
Creatinine, Ser: 0.87 mg/dL (ref 0.44–1.00)
GFR calc Af Amer: >60 mL/min (ref >60)
GFR calc non Af Amer: >60 mL/min (ref >60)
GLUCOSE: 87 mg/dL (ref 65–99)
Potassium: 3.5 mmol/L (ref 3.5–5.1)
Sodium: 141 mmol/L (ref 135–145)

## 2016-10-19 LAB — CBC
HEMATOCRIT: 41.3 % (ref 35.0–47.0)
Hemoglobin: 13.9 g/dL (ref 12.0–16.0)
MCH: 30.5 pg (ref 26.0–34.0)
MCHC: 33.6 g/dL (ref 32.0–36.0)
MCV: 90.6 fL (ref 80.0–100.0)
Platelets: 283 10*3/uL (ref 150–440)
RBC: 4.55 MIL/uL (ref 3.80–5.20)
RDW: 13.7 % (ref 11.5–14.5)
WBC: 9.1 10*3/uL (ref 3.6–11.0)

## 2016-10-19 LAB — TROPONIN I

## 2016-10-19 MED ORDER — IPRATROPIUM-ALBUTEROL 0.5-2.5 (3) MG/3ML IN SOLN
3.0000 mL | Freq: Once | RESPIRATORY_TRACT | Status: AC
  Administered 2016-10-19: 3 mL via RESPIRATORY_TRACT
  Filled 2016-10-19: qty 3

## 2016-10-19 MED ORDER — DEXAMETHASONE SODIUM PHOSPHATE 10 MG/ML IJ SOLN
10.0000 mg | Freq: Once | INTRAMUSCULAR | Status: AC
  Administered 2016-10-19: 10 mg via INTRAMUSCULAR
  Filled 2016-10-19: qty 1

## 2016-10-19 MED ORDER — PREDNISONE 10 MG PO TABS: ORAL_TABLET | ORAL | 0 refills | Status: DC

## 2016-10-19 MED ORDER — BENZONATATE 100 MG PO CAPS: 200.0000 mg | ORAL_CAPSULE | Freq: Three times a day (TID) | ORAL | 0 refills | Status: AC | PRN

## 2016-10-19 NOTE — ED Triage Notes (Signed)
Patient presents to the ED after being seen in the ED on Monday and diagnosed with bronchitis.  Patient states she is feeling worse.  Patient states she is having intermittent chest pain that is worse with breathing and coughing.  Patient reports severe headache that began today as well.  Patient's respirations are even and not labored at this time.

## 2016-10-19 NOTE — ED Notes (Signed)
Non-productive cough with expiratory wheezing. Headache. Generalized body aches

## 2016-10-19 NOTE — ED Provider Notes (Signed)
[facility] Emergency Department Provider Note  ____________________________________________   First MD Initiated Contact with Patient 10/19/16 1341     (approximate)  I have reviewed the triage vital signs and the nursing notes.   HISTORY  Chief Complaint Nasal Congestion; Headache; and Chest Pain   HPI Diane Warner is a 51 y.o. female presents to the emergency room with complaint of not "getting any better". Patient states that she was seen on 10/17/16 in the emergency department and diagnosed with bronchitis. She states that she continues to have coughing along with a headache. She states that she did get the Zithromax filled and has been taking it but did not have enough money to purchase the prednisone that was prescribed. She states that she used the inhaler on a when necessary basis. She has a history of both pneumonia and bronchitis in the past. She states that she quit smoking almost 3 years ago. She continues to be around people that do smoke. Patient rates her pain as 10 over 10.   Past Medical History:  Diagnosis Date  . Anxiety   . Arthritis   . Asthma   . Chronic right hip pain   . Degenerative disc disease, thoracic   . GERD (gastroesophageal reflux disease)   . Hypertension   . Irritable bowel syndrome (IBS)   . Scoliosis     Patient Active Problem List   Diagnosis Date Noted  . Anxiety 04/11/2016  . Arthritis 04/11/2016  . Asthma 04/11/2016  . Chronic right hip pain 04/11/2016  . Degenerative disc disease, thoracic 04/11/2016  . GERD (gastroesophageal reflux disease) 04/11/2016  . Hypertension 04/11/2016  . Irritable bowel syndrome (IBS) 04/11/2016  . Scoliosis 04/11/2016  . Chronic pain syndrome 04/11/2016  . Chronic bilateral low back pain without sciatica 04/11/2016  . Long term prescription benzodiazepine use 04/11/2016  . Encounter for opiate analgesic use agreement 04/11/2016  . Long term current use of opiate  analgesic 04/11/2016  . Long term prescription opiate use 04/11/2016  . Opiate use 04/11/2016  . Obesity, Class II, BMI 35-39.9 04/11/2016    Past Surgical History:  Procedure Laterality Date  . ABDOMINAL HYSTERECTOMY    . CESAREAN SECTION  1985  . CHOLECYSTECTOMY    . TUBAL LIGATION      Prior to Admission medications   Medication Sig Start Date End Date Taking? Authorizing Provider  albuterol (PROVENTIL HFA;VENTOLIN HFA) 108 (90 Base) MCG/ACT inhaler Inhale 2 puffs into the lungs every 6 (six) hours as needed for wheezing or shortness of breath. 10/17/16   Orvil Feil, PA-C  Asenapine Maleate (SAPHRIS SL) Place 1 tablet under the tongue daily.      [provider]  azithromycin (ZITHROMAX Z-PAK) 250 MG tablet Take 2 tablets (500 mg) on  Day 1,  followed by 1 tablet (250 mg) once daily on Days 2 through 5. 10/17/16 10/22/16  Orvil Feil, PA-C  benzonatate (TESSALON PERLES) 100 MG capsule Take 2 capsules (200 mg total) by mouth 3 (three) times daily as needed. 10/19/16 10/19/17  Tommi Rumps, PA-C  citalopram (CELEXA) 20 MG tablet Take 20 mg by mouth daily.      [provider]  diclofenac sodium (VOLTAREN) 1 % GEL Apply 2 g topically 4 (four) times daily. 09/15/16   Enid Derry, PA-C  hydrOXYzine (ATARAX/VISTARIL) 10 MG tablet Take 1 tablet (10 mg total) by mouth every 8 (eight) hours as needed. 10/17/16 10/22/16  Orvil Feil, PA-C  lidocaine (LIDODERM) 5 % Place 1 patch onto the skin every 12 (twelve) hours. Remove & Discard patch within 12 hours or as directed by MD 09/15/16 09/15/17  Enid Derry, PA-C  LORazepam (ATIVAN) 1 MG tablet Take 1 tablet (1 mg total) by mouth 2 (two) times daily as needed for anxiety. 11/09/15 11/08/16  Jennye Moccasin, MD  LORazepam (ATIVAN) 1 MG tablet Take 1 mg by mouth 2 (two) times daily.    [provider]  predniSONE (DELTASONE) 10 MG tablet Take 6 tablets  today, on day 2 take 5 tablets, day 3 take 4 tablets, day 4 take 3  tablets, day 5 take  2 tablets and 1 tablet the last day 10/19/16   Tommi Rumps, PA-C  propranolol (INDERAL) 40 MG tablet Take 1 tablet (40 mg total) by mouth 2 (two) times daily. 09/20/15   Jennye Moccasin, MD  ranitidine (ZANTAC) 150 MG tablet Take 150 mg by mouth 2 (two) times daily.      [provider]    Allergies Amoxicillin; Aspirin; Compazine; Dilaudid [hydromorphone hcl]; Penicillins; Proton pump inhibitors; and Zofran [ondansetron hcl]  No family history on file.  Social History Social History  Substance Use Topics  . Smoking status: Former Smoker    Quit date: 02/16/2014  . Smokeless tobacco: Never Used  . Alcohol use No    Review of Systems  Constitutional: No fever/chills Eyes: No visual changes. ENT: No sore throat. Cardiovascular: Denies chest pain. Respiratory: Denies shortness of breath. Positive cough. Gastrointestinal: No abdominal pain.  No nausea, no vomiting.   Musculoskeletal: Negative for back pain. Skin: Negative for rash. Neurological: Positive for headaches, no focal weakness or numbness.   ____________________________________________   PHYSICAL EXAM:  VITAL SIGNS: ED Triage Vitals [10/19/16 1210]  Enc Vitals Group     BP (!) 151/89     Pulse Rate 89     Resp 18     Temp 98.2 F (36.8 C)     Temp Source Oral     SpO2 99 %     Weight      Height      Head Circumference      Peak Flow      Pain Score 10     Pain Loc      Pain Edu?      Excl. in GC?    Constitutional: Alert and oriented. Well appearing and in no acute distress. Eyes: Conjunctivae are normal. PERRL. EOMI. Head: Atraumatic. Nose: No congestion/rhinnorhea. Mouth/Throat: Mucous membranes are moist.  Oropharynx non-erythematous. Neck: No stridor.   Hematological/Lymphatic/Immunilogical: No cervical lymphadenopathy. Cardiovascular: Normal rate, regular rhythm. Grossly normal heart sounds.  Good peripheral circulation. Respiratory: Normal respiratory effort.   No retractions. Lungs occasional expiratory wheezes heard bilaterally with a very coarse cough frequently. This improved after DuoNeb treatments. Gastrointestinal: Soft and nontender. No distention. No abdominal bruits. No CVA tenderness. Musculoskeletal: No lower extremity tenderness nor edema.  No joint effusions. Neurologic:  Normal speech and language. No gross focal neurologic deficits are appreciated. No gait instability. Skin:  Skin is warm, dry and intact. No rash noted. Psychiatric: Mood and affect are normal. Speech and behavior are normal.  ____________________________________________   LABS (all labs ordered are listed, but only abnormal results are displayed)  Labs Reviewed  BASIC METABOLIC PANEL - Abnormal; Notable for the following:       Result Value   Calcium 8.6 (*)    All other components within normal limits  CBC  TROPONIN I   ____________________________________________  EKG  EKG showed normal sinus rhythm with ventricular rate of 96. PR interval 116, QRS duration 76.   ____________________________________________  RADIOLOGY  Dg Chest 2 View  Result Date: 10/19/2016 CLINICAL DATA:  Chest pain and congestion EXAM: CHEST  2 VIEW COMPARISON:  10/17/2016 FINDINGS: Chronic central bronchitic changes, unchanged. No focal pneumonia, collapse or consolidation. Negative for edema, effusion or pneumothorax. Trachea is midline. Normal heart size and vascularity. No acute osseous finding. IMPRESSION: Chronic bronchitic changes. No superimposed pneumonia or acute process. Electronically Signed   By: Judie PetitM.  Shick M.D.   On: 10/19/2016 12:32    ____________________________________________   PROCEDURES  Procedure(s) performed: None  Procedures  Critical Care performed: No  ____________________________________________   INITIAL IMPRESSION / ASSESSMENT AND PLAN / ED COURSE  Pertinent labs & imaging results that were available during my care of the patient were  reviewed by me and considered in my medical decision making (see chart for details).   Patient was improved after 2 DuoNeb treatments and Decadron 10 mg IM. Patient is encouraged to take continue taking Zithromax until completely finished. She was given a prescription for prednisone tablets with instructions for a 6 day taper starting at 60 mg. These are loose tablets and should be much cheaper than the packaged prescription. She is also given a prescription for Tessalon Perles 2 tablets every 8 hours as needed for coughing. She was given a note for work for the next 2 days. She is encouraged to follow-up with her PCP at [facility] if any continued problems.     ____________________________________________   FINAL CLINICAL IMPRESSION(S) / ED DIAGNOSES  Final diagnoses:  Acute bronchitis, unspecified organism      NEW MEDICATIONS STARTED DURING THIS VISIT:  New Prescriptions   BENZONATATE (TESSALON PERLES) 100 MG CAPSULE    Take 2 capsules (200 mg total) by mouth 3 (three) times daily as needed.   PREDNISONE (DELTASONE) 10 MG TABLET    Take 6 tablets  today, on day 2 take 5 tablets, day 3 take 4 tablets, day 4 take 3 tablets, day 5 take  2 tablets and 1 tablet the last day     Note:  This document was prepared using Dragon voice recognition software and may include unintentional dictation errors.    Tommi RumpsSummers, Eyanna Mcgonagle L, PA-C 10/19/16 1628    Charlynne PanderYao, David Hsienta, MD 10/24/16 [phone]

## 2016-10-19 NOTE — ED Notes (Signed)
Pt discharged home after verbalizing understanding of discharge instructions; nad noted. 

## 2016-10-19 NOTE — Discharge Instructions (Signed)
Follow-up with your [facility] care [facility] if any continued problems. Begin taking medication as directed. Finish taking the Zithromax. Stay away from secondhand smoke.

## 2017-01-18 ENCOUNTER — Emergency Department: Payer: Self-pay

## 2017-01-18 ENCOUNTER — Emergency Department
Admission: EM | Admit: 2017-01-18 | Discharge: 2017-01-18 | Disposition: A | Discharge status: Home / Self Care | Payer: Self-pay | Attending: Emergency Medicine | Admitting: Emergency Medicine

## 2017-01-18 DIAGNOSIS — G8929 Other chronic pain: Secondary | ICD-10-CM | POA: Insufficient documentation

## 2017-01-18 DIAGNOSIS — M67952 Unspecified disorder of synovium and tendon, left thigh: Secondary | ICD-10-CM | POA: Insufficient documentation

## 2017-01-18 DIAGNOSIS — I10 Essential (primary) hypertension: Secondary | ICD-10-CM | POA: Insufficient documentation

## 2017-01-18 DIAGNOSIS — Y990 Civilian activity done for income or pay: Secondary | ICD-10-CM | POA: Insufficient documentation

## 2017-01-18 DIAGNOSIS — Y9389 Activity, other specified: Secondary | ICD-10-CM | POA: Insufficient documentation

## 2017-01-18 DIAGNOSIS — S76012A Strain of muscle, fascia and tendon of left hip, initial encounter: Secondary | ICD-10-CM | POA: Insufficient documentation

## 2017-01-18 DIAGNOSIS — Z79899 Other long term (current) drug therapy: Secondary | ICD-10-CM | POA: Insufficient documentation

## 2017-01-18 DIAGNOSIS — Z87891 Personal history of nicotine dependence: Secondary | ICD-10-CM | POA: Insufficient documentation

## 2017-01-18 DIAGNOSIS — Y92199 Unspecified place in other specified residential institution as the place of occurrence of the external cause: Secondary | ICD-10-CM | POA: Insufficient documentation

## 2017-01-18 DIAGNOSIS — X58XXXA Exposure to other specified factors, initial encounter: Secondary | ICD-10-CM | POA: Insufficient documentation

## 2017-01-18 DIAGNOSIS — J45909 Unspecified asthma, uncomplicated: Secondary | ICD-10-CM | POA: Insufficient documentation

## 2017-01-18 LAB — URINALYSIS, COMPLETE (UACMP) WITH MICROSCOPIC
BILIRUBIN URINE: NEGATIVE
Glucose, UA: NEGATIVE mg/dL
KETONES UR: NEGATIVE mg/dL
Nitrite: NEGATIVE
Protein, ur: NEGATIVE mg/dL
SPECIFIC GRAVITY, URINE: 1.02 (ref 1.005–1.030)
pH: 5 (ref 5.0–8.0)

## 2017-01-18 MED ORDER — LIDOCAINE HCL (PF) 1 % IJ SOLN
INTRAMUSCULAR | Status: AC
  Filled 2017-01-18: qty 5

## 2017-01-18 MED ORDER — PREDNISOLONE 5 MG (48) PO TBPK: 5.0000 mg | ORAL_TABLET | ORAL | 0 refills | Status: DC

## 2017-01-18 MED ORDER — OXYCODONE-ACETAMINOPHEN 5-325 MG PO TABS: 1.0000 | ORAL_TABLET | Freq: Two times a day (BID) | ORAL | 0 refills | Status: DC

## 2017-01-18 MED ORDER — DIPHENHYDRAMINE HCL 25 MG PO CAPS
25.0000 mg | ORAL_CAPSULE | Freq: Once | ORAL | Status: AC
  Administered 2017-01-18: 25 mg via ORAL
  Filled 2017-01-18: qty 1

## 2017-01-18 MED ORDER — DIAZEPAM 2 MG PO TABS: 2.0000 mg | ORAL_TABLET | Freq: Three times a day (TID) | ORAL | 0 refills | Status: DC | PRN

## 2017-01-18 MED ORDER — HYDROMORPHONE HCL 1 MG/ML IJ SOLN
1.0000 mg | Freq: Once | INTRAMUSCULAR | Status: AC
  Administered 2017-01-18: 1 mg via INTRAVENOUS
  Filled 2017-01-18: qty 1

## 2017-01-18 MED ORDER — ORPHENADRINE CITRATE 30 MG/ML IJ SOLN
60.0000 mg | INTRAMUSCULAR | Status: AC
  Administered 2017-01-18: 60 mg via INTRAMUSCULAR
  Filled 2017-01-18: qty 2

## 2017-01-18 MED ORDER — KETOROLAC TROMETHAMINE 60 MG/2ML IM SOLN
30.0000 mg | Freq: Once | INTRAMUSCULAR | Status: AC
  Administered 2017-01-18: 30 mg via INTRAMUSCULAR
  Filled 2017-01-18: qty 2

## 2017-01-18 NOTE — Discharge Instructions (Signed)
Your history, exam and imaging suggests you have re-aggravated your previous gluteus medius/minimus muscle tears. You should follow[facility]up with [facility] for further evaluation. Take the prescription meds as directed. Return to the ED as needed.

## 2017-01-18 NOTE — ED Triage Notes (Signed)
Pt states she injured her lower back a couple of day ago while lifting a person  With radiating pain into BL LE..Marland Kitchen

## 2017-01-18 NOTE — ED Provider Notes (Signed)
[facility] Emergency Department Provider Note ____________________________________________  Time seen: 1511  I have reviewed the triage vital signs and the nursing notes.  HISTORY  Chief Complaint  Back Pain  HPI Diane Warner is a 51 y.o. female presents to the ED for evaluation of low back pain, from 2 days prior. 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 She reports the patient began to fall and she grabbed the patient from behind and the patient essentially sat in her lap, and she was able to avoid hitting the floor. She describes since that time she has had aggravation of her chronic low back pain. She describes pain primarily to the left buttocks and hip region. She denies any groin numbness or paresthesias. She reports a recent history of an MRI-confirmed gluteal muscle tear on the left hip. She reports she was scheduled for surgery in [place], but her insurance lapsed prior to scheduling. He has had no medical management of her injury since that time. She denies any bladder or bowel incontinence. She also denies any significant referral beyond the buttocks. She has taken Advil in the interim with limited benefit. She presents now with pain increased above baseline to the lower back and right lumbar sacral region.  Past Medical History:  Diagnosis Date  . Anxiety   . Arthritis   . Asthma   . Chronic right hip pain   . Degenerative disc disease, thoracic   . GERD (gastroesophageal reflux disease)   . Hypertension   . Irritable bowel syndrome (IBS)   . Scoliosis     Patient Active Problem List   Diagnosis Date Noted  . Anxiety 04/11/2016  . Arthritis 04/11/2016  . Asthma 04/11/2016  . Chronic right hip pain 04/11/2016  . Degenerative disc disease, thoracic 04/11/2016  . GERD (gastroesophageal reflux disease) 04/11/2016  . Hypertension 04/11/2016  . Irritable bowel syndrome (IBS) 04/11/2016  . Scoliosis  04/11/2016  . Chronic pain syndrome 04/11/2016  . Chronic bilateral low back pain without sciatica 04/11/2016  . Long term prescription benzodiazepine use 04/11/2016  . Encounter for opiate analgesic use agreement 04/11/2016  . Long term current use of opiate analgesic 04/11/2016  . Long term prescription opiate use 04/11/2016  . Opiate use 04/11/2016  . Obesity, Class II, BMI 35-39.9 04/11/2016    Past Surgical History:  Procedure Laterality Date  . ABDOMINAL HYSTERECTOMY    . CESAREAN SECTION  1985  . CHOLECYSTECTOMY    . TUBAL LIGATION      Prior to Admission medications   Medication Sig Start Date End Date Taking? Authorizing Provider  albuterol (PROVENTIL HFA;VENTOLIN HFA) 108 (90 Base) MCG/ACT inhaler Inhale 2 puffs into the lungs every 6 (six) hours as needed for wheezing or shortness of breath. 10/17/16   Orvil Feil, PA-C  Asenapine Maleate (SAPHRIS SL) Place 1 tablet under the tongue daily.      [provider]  benzonatate (TESSALON PERLES) 100 MG capsule Take 2 capsules (200 mg total) by mouth 3 (three) times daily as needed. 10/19/16 10/19/17  Tommi Rumps, PA-C  citalopram (CELEXA) 20 MG tablet Take 20 mg by mouth daily.      [provider]  diazepam (VALIUM) 2 MG tablet Take 1 tablet (2 mg total) by mouth every 8 (eight) hours as needed for muscle spasms. 01/18/17   Lahari Suttles, Charlesetta Ivory, PA-C  diclofenac sodium (VOLTAREN) 1 % GEL Apply 2 g topically 4 (four) times daily.  09/15/16   Enid DerryWagner, Ashley, PA-C  lidocaine (LIDODERM) 5 % Place 1 patch onto the skin every 12 (twelve) hours. Remove & Discard patch within 12 hours or as directed by MD 09/15/16 09/15/17  Enid DerryWagner, Ashley, PA-C  LORazepam (ATIVAN) 1 MG tablet Take 1 mg by mouth 2 (two) times daily.    [provider]  oxyCODONE-acetaminophen (ROXICET) 5-325 MG tablet Take 1 tablet by mouth 2 times daily at 12 noon and 4 pm. 01/18/17   Yaiza Palazzola, Charlesetta IvoryJenise V Bacon, PA-C  PrednisoLONE 5 MG (48) TBPK Take  5 mg by mouth as directed. 12-day taper as directed 01/18/17   Deyja Sochacki, Charlesetta IvoryJenise V Bacon, PA-C  predniSONE (DELTASONE) 10 MG tablet Take 6 tablets  today, on day 2 take 5 tablets, day 3 take 4 tablets, day 4 take 3 tablets, day 5 take  2 tablets and 1 tablet the last day 10/19/16   Tommi RumpsSummers, Rhonda L, PA-C  propranolol (INDERAL) 40 MG tablet Take 1 tablet (40 mg total) by mouth 2 (two) times daily. 09/20/15   Jennye MoccasinQuigley, Brian S, MD  ranitidine (ZANTAC) 150 MG tablet Take 150 mg by mouth 2 (two) times daily.      [provider]    Allergies Amoxicillin; Aspirin; Compazine; Dilaudid [hydromorphone hcl]; Penicillins; Proton pump inhibitors; and Zofran [ondansetron hcl]  No family history on file.  Social History Social History  Substance Use Topics  . Smoking status: Former Smoker    Quit date: 02/16/2014  . Smokeless tobacco: Never Used  . Alcohol use No    Review of Systems  Constitutional: Negative for fever. Gastrointestinal: Negative for abdominal pain, vomiting and diarrhea. Genitourinary: Negative for dysuria. Musculoskeletal: Positive for back & R hip/buttock pain. Skin: Negative for rash. Neurological: Negative for headaches, focal weakness or numbness. ____________________________________________  PHYSICAL EXAM:  VITAL SIGNS: ED Triage Vitals  Enc Vitals Group     BP 01/18/17 1507 (!) 139/93     Pulse Rate 01/18/17 1507 89     Resp 01/18/17 1507 18     Temp 01/18/17 1507 97.9 F (36.6 C)     Temp Source 01/18/17 1507 Oral     SpO2 01/18/17 1507 97 %     Weight 01/18/17 1508 220 lb (99.8 kg)     Height 01/18/17 1508 5\' 4"  (1.626 m)     Head Circumference --      Peak Flow --      Pain Score 01/18/17 1507 10     Pain Loc --      Pain Edu? --      Excl. in GC? --     Constitutional: Alert and oriented. Well appearing and in no distress. Head: Normocephalic and atraumatic. Cardiovascular: Normal rate, regular rhythm. Normal distal pulses. Respiratory: Normal  respiratory effort. No wheezes/rales/rhonchi. Gastrointestinal: Soft and nontender. No distention. Musculoskeletal: patient with tenderness to palpation over the left gluteal region and left trochanter. He has severely limited range of motion to the left hip with flexion, extension, and external rotation secondary to pain. She transitions slowly from supine to sit, she has decreased hip flexion range in a seated position on the left. She transitions from sitting to standing without difficulty. But is unable to perform a single leg raise on the left side. Er lumbar extension range is limited by pain, and flexion range is limited by pain.Nontender with normal range of motion in all extremities.  Neurologic:  Antalgic gait without ataxia. Normal LE DTRs bilaterally. Normal toe dorsiflexion and foot  eversion noted. Normal speech and language. No gross focal neurologic deficits are appreciated. Skin:  Skin is warm, dry and intact. No rash noted. ____________________________________________   LABS (pertinent positives/negatives)  Labs Reviewed  URINALYSIS, COMPLETE (UACMP) WITH MICROSCOPIC - Abnormal; Notable for the following:       Result Value   Color, Urine YELLOW (*)    APPearance HAZY (*)    Hgb urine dipstick SMALL (*)    Leukocytes, UA LARGE (*)    Bacteria, UA FEW (*)    Squamous Epithelial / LPF 6-30 (*)    All other components within normal limits  ____________________________________________   RADIOLOGY  Lumbar Spine IMPRESSION: Mild disc degeneration L3-4 and L4-5. No change from the prior Study. ____________________________________________  PROCEDURES  Toradol 30 mg IM Norflex 60 mg IM Dilaudid 1 mg IM Benadryl 25 mg PO ____________________________________________  INITIAL IMPRESSION / ASSESSMENT AND PLAN / ED COURSE  Patient with ED evaluation of acute on chronic low back pain with what appears to be exacerbation of her pre-existing left gluteus medius and gluteus  minimus muscle tears. She is discharged with prescriptions for Percocet, valium, and prednisone dose as directed. Sh is advised to follow up with her provider at [facility] and consider applying for charity care there. She is also advised that Dr. Joice Lofts at [facility] performs the same surgery that she needs for her torn gluteal muscle.She will be written out of work for the remainder of this week. She will follow-up with her PCP and return precautions are reviewed. ____________________________________________  FINAL CLINICAL IMPRESSION(S) / ED DIAGNOSES  Final diagnoses:  Tendinopathy of left gluteus medius  Tear of gluteus minimus tendon, left, initial encounter      Lissa Hoard, PA-C 01/20/17 0058    Loleta Rose, MD 01/20/17 [phone]

## 2017-01-18 NOTE — ED Notes (Signed)
Pt taken to car and helped into car by family. Pt in NAD at this time.

## 2017-04-13 IMAGING — CT CT ANGIO CHEST
1 of 2 series · 18 of 30 positions shown · IV contrast (APPLIED)
Comparison: Chest x-ray 08/03/2015

CLINICAL DATA: Pleuritic chest pain

EXAM:
CT ANGIOGRAPHY CHEST WITH CONTRAST
TECHNIQUE: Multidetector CT imaging of the chest was performed using the
standard protocol during bolus administration of intravenous
contrast. Multiplanar CT image reconstructions and MIPs were
obtained to evaluate the vascular anatomy.
CONTRAST:  75mL OMNIPAQUE IOHEXOL 350 MG/ML SOLN

[Series 6: pe 1.0 thins · axial · 0.68mm/px · z∈[-96,+138]mm · 18 of 265 slices shown]
[im 15/265  lung]
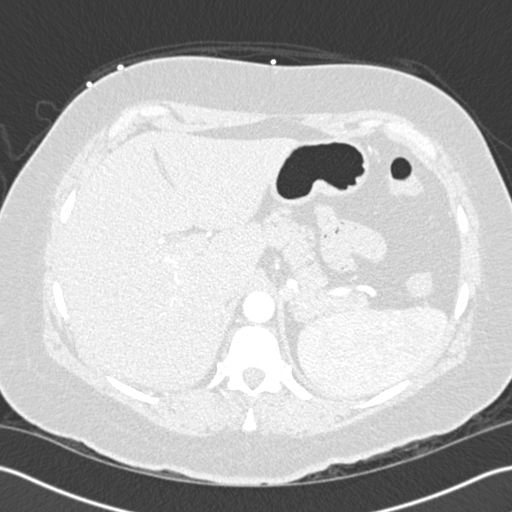
[im 30/265  mediastinal]
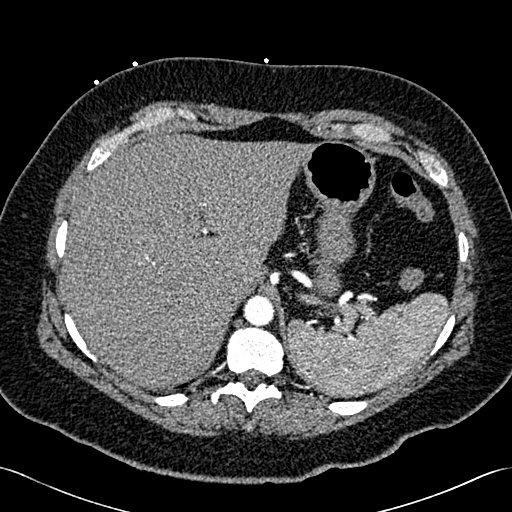
[im 45/265  lung]
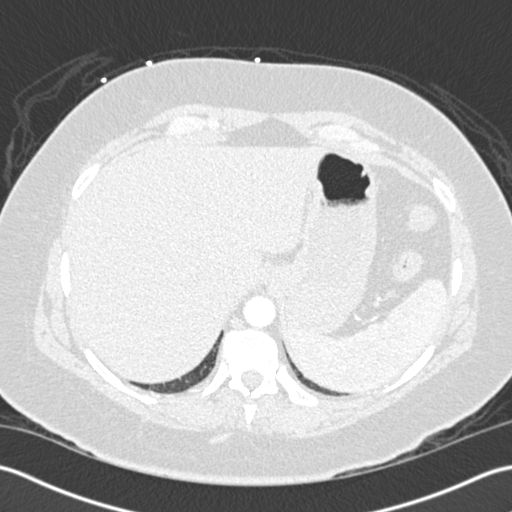
[im 59/265  mediastinal]
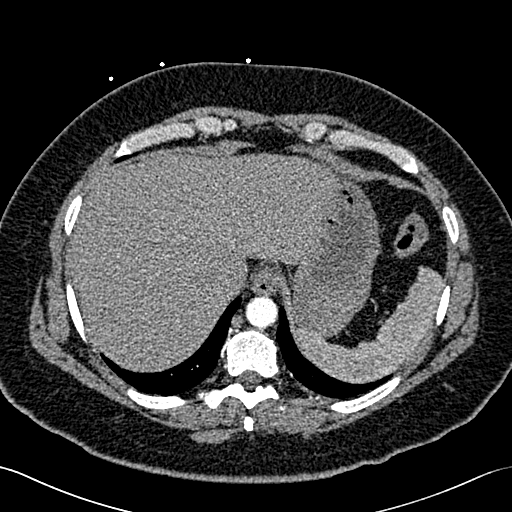
[im 74/265  lung]
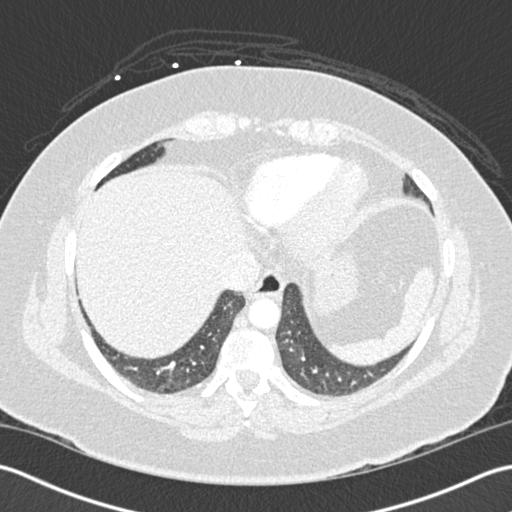
[im 89/265  mediastinal]
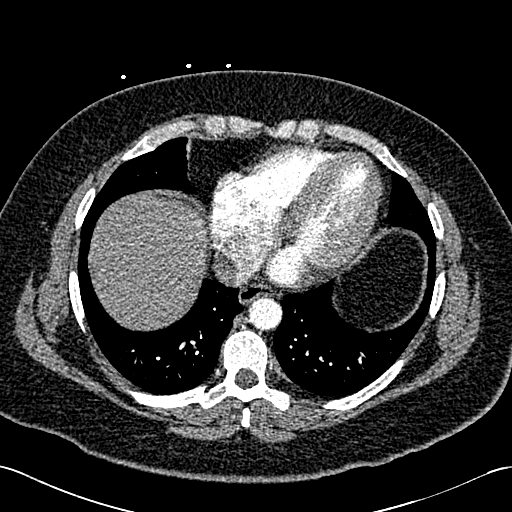
[im 103/265  lung]
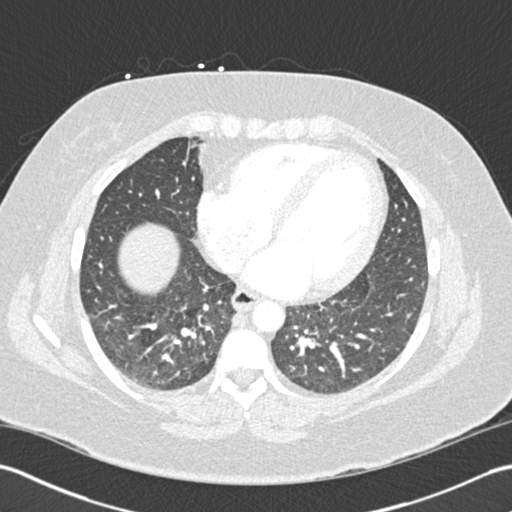
[im 118/265  mediastinal]
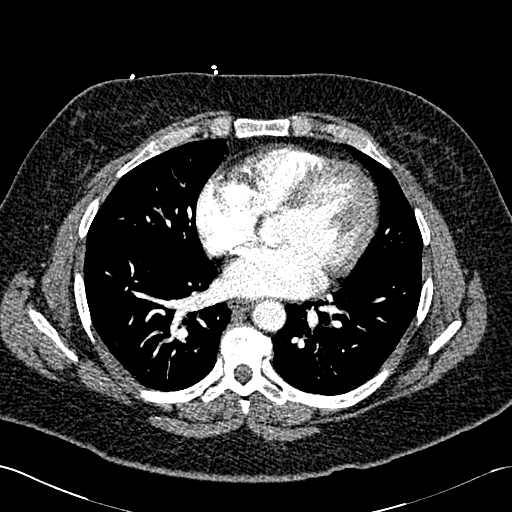
[im 123/265  lung]
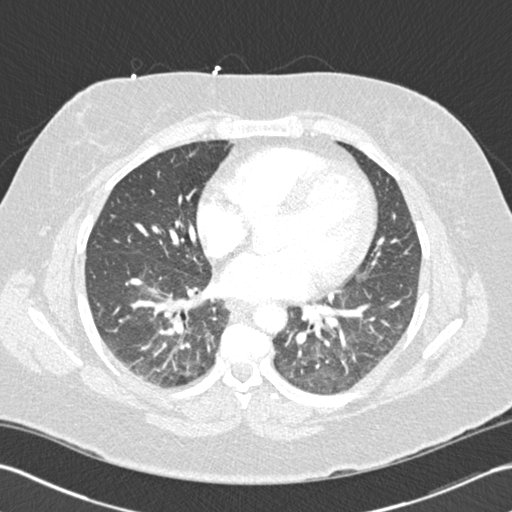
[im 133/265  mediastinal]
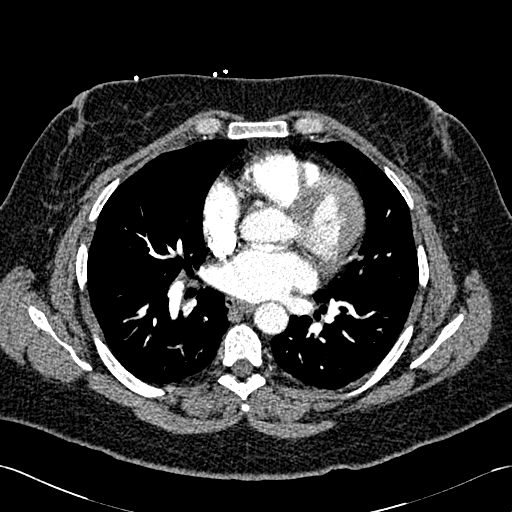
[im 147/265  lung]
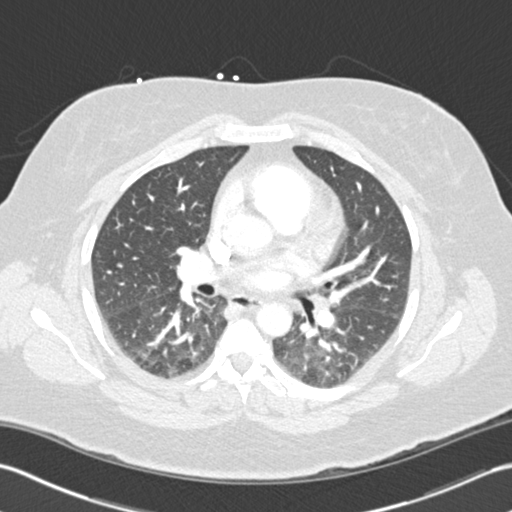
[im 162/265  mediastinal]
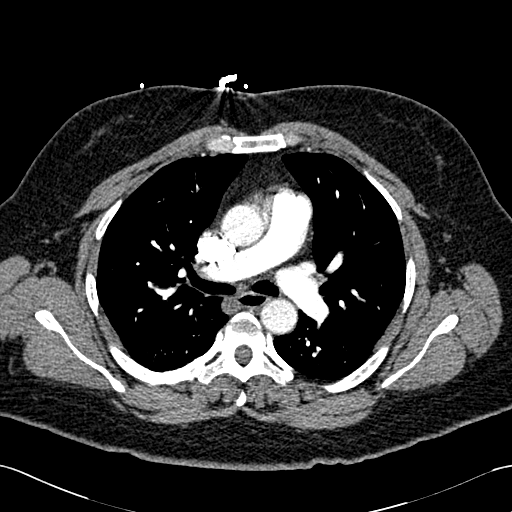
[im 177/265  lung]
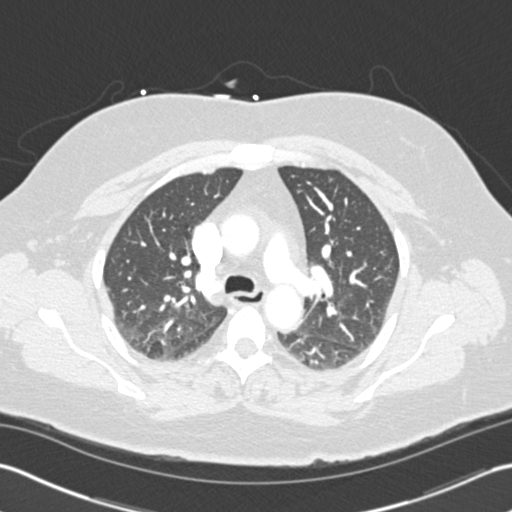
[im 191/265  mediastinal]
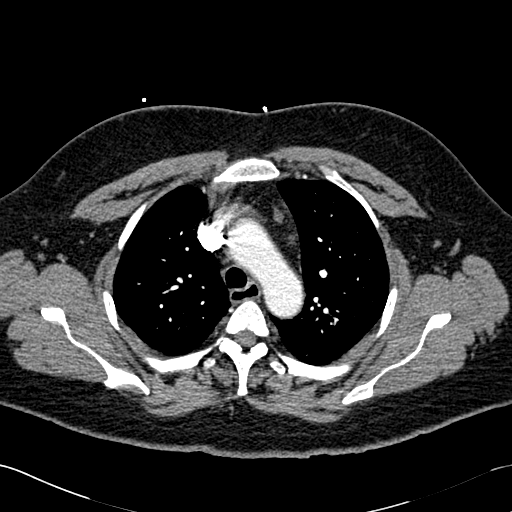
[im 206/265  lung]
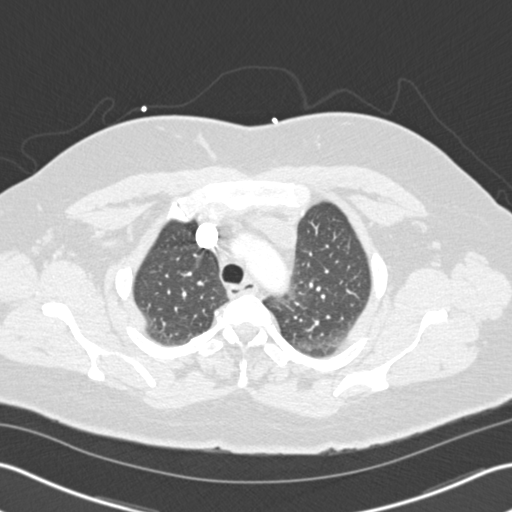
[im 221/265  mediastinal]
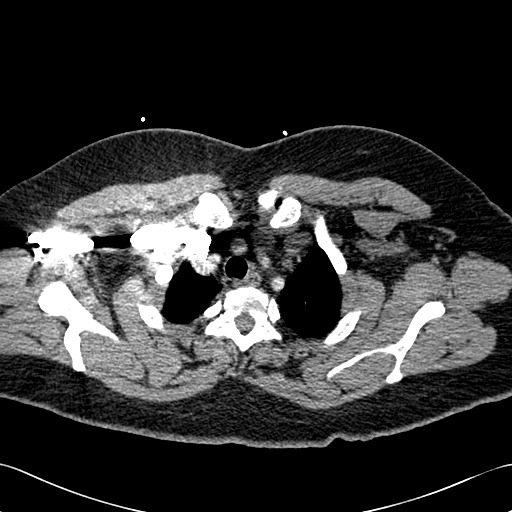
[im 235/265  lung]
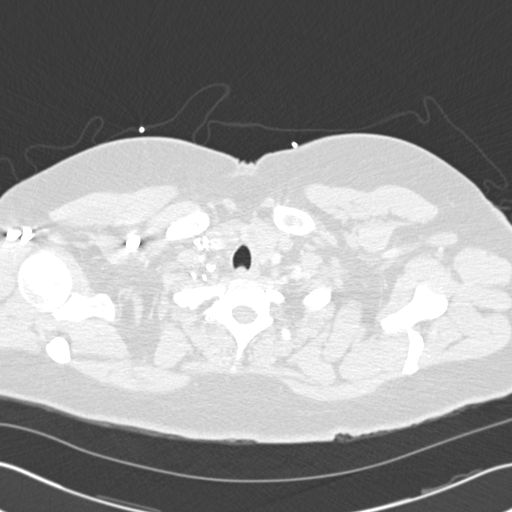
[im 250/265  mediastinal]
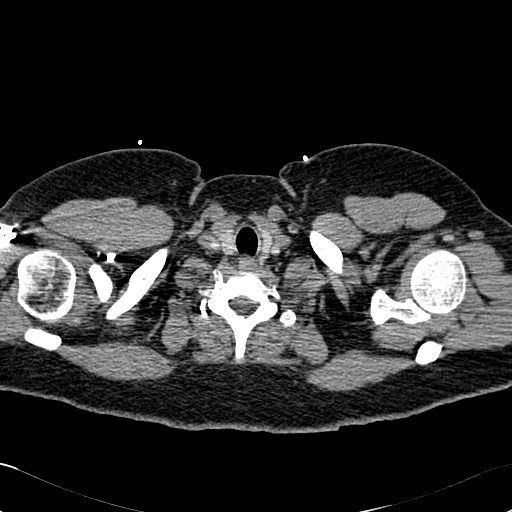

[18 of 30 positions shown; findings below may reference images not displayed]

FINDINGS: Negative for pulmonary embolism. Satisfactory pulmonary artery
enhancement. Pulmonary arteries normal in caliber. Negative for
aortic aneurysm or dissection. Heart size normal. No pericardial
effusion. No coronary calcification

Mild right middle lobe atelectasis. Negative for pneumonia. No
pleural effusion.

Negative for mass or adenopathy.

No acute skeletal abnormality.

Review of the MIP images confirms the above findings.
IMPRESSION: Negative for pulmonary embolism. No acute abnormality in the chest.
Mild right middle lobe atelectasis.

## 2017-05-29 ENCOUNTER — Emergency Department
Admission: EM | Admit: 2017-05-29 | Discharge: 2017-05-30 | Disposition: A | Discharge status: Home / Self Care | Payer: Self-pay | Attending: Emergency Medicine | Admitting: Emergency Medicine

## 2017-05-29 ENCOUNTER — Encounter: Payer: Self-pay | Admitting: *Deleted

## 2017-05-29 ENCOUNTER — Other Ambulatory Visit: Payer: Self-pay

## 2017-05-29 ENCOUNTER — Emergency Department: Payer: Self-pay

## 2017-05-29 DIAGNOSIS — J45909 Unspecified asthma, uncomplicated: Secondary | ICD-10-CM | POA: Insufficient documentation

## 2017-05-29 DIAGNOSIS — S060X0A Concussion without loss of consciousness, initial encounter: Secondary | ICD-10-CM | POA: Insufficient documentation

## 2017-05-29 DIAGNOSIS — Z79899 Other long term (current) drug therapy: Secondary | ICD-10-CM | POA: Insufficient documentation

## 2017-05-29 DIAGNOSIS — Y9389 Activity, other specified: Secondary | ICD-10-CM | POA: Insufficient documentation

## 2017-05-29 DIAGNOSIS — W01198A Fall on same level from slipping, tripping and stumbling with subsequent striking against other object, initial encounter: Secondary | ICD-10-CM | POA: Insufficient documentation

## 2017-05-29 DIAGNOSIS — S40012A Contusion of left shoulder, initial encounter: Secondary | ICD-10-CM | POA: Insufficient documentation

## 2017-05-29 DIAGNOSIS — S161XXA Strain of muscle, fascia and tendon at neck level, initial encounter: Secondary | ICD-10-CM | POA: Insufficient documentation

## 2017-05-29 DIAGNOSIS — M5412 Radiculopathy, cervical region: Secondary | ICD-10-CM

## 2017-05-29 DIAGNOSIS — Z87891 Personal history of nicotine dependence: Secondary | ICD-10-CM | POA: Insufficient documentation

## 2017-05-29 DIAGNOSIS — I10 Essential (primary) hypertension: Secondary | ICD-10-CM | POA: Insufficient documentation

## 2017-05-29 DIAGNOSIS — Y999 Unspecified external cause status: Secondary | ICD-10-CM | POA: Insufficient documentation

## 2017-05-29 DIAGNOSIS — Y92009 Unspecified place in unspecified non-institutional (private) residence as the place of occurrence of the external cause: Secondary | ICD-10-CM | POA: Insufficient documentation

## 2017-05-29 DIAGNOSIS — W19XXXA Unspecified fall, initial encounter: Secondary | ICD-10-CM

## 2017-05-29 DIAGNOSIS — S7002XA Contusion of left hip, initial encounter: Secondary | ICD-10-CM | POA: Insufficient documentation

## 2017-05-29 MED ORDER — METHOCARBAMOL 500 MG PO TABS
1000.0000 mg | ORAL_TABLET | Freq: Once | ORAL | Status: AC
  Administered 2017-05-29: 1000 mg via ORAL
  Filled 2017-05-29: qty 2

## 2017-05-29 MED ORDER — METHOCARBAMOL 500 MG PO TABS: 500.0000 mg | ORAL_TABLET | Freq: Four times a day (QID) | ORAL | 0 refills | Status: DC

## 2017-05-29 MED ORDER — MELOXICAM 15 MG PO TABS: 15.0000 mg | ORAL_TABLET | Freq: Every day | ORAL | 0 refills | Status: DC

## 2017-05-29 MED ORDER — METHOCARBAMOL 1000 MG/10ML IJ SOLN
1000.0000 mg | Freq: Once | INTRAMUSCULAR | Status: DC
  Filled 2017-05-29: qty 10

## 2017-05-29 MED ORDER — DIPHENHYDRAMINE HCL 50 MG/ML IJ SOLN
50.0000 mg | Freq: Once | INTRAMUSCULAR | Status: AC
  Administered 2017-05-29: 50 mg via INTRAMUSCULAR
  Filled 2017-05-29: qty 1

## 2017-05-29 MED ORDER — KETOROLAC TROMETHAMINE 30 MG/ML IJ SOLN
30.0000 mg | Freq: Once | INTRAMUSCULAR | Status: AC
  Administered 2017-05-29: 30 mg via INTRAMUSCULAR
  Filled 2017-05-29: qty 1

## 2017-05-29 NOTE — ED Triage Notes (Signed)
Pt tripped over a mattress and fell yesterday onto a concrete floor.  No loc.  Pt has back pain and a headache.  Pt alert.  Speech clear.

## 2017-05-29 NOTE — ED Provider Notes (Signed)
[facility] Emergency Department Provider Note  ____________________________________________  Time seen: Approximately 9:23 PM  I have reviewed the triage vital signs and the nursing notes.   HISTORY  Chief Complaint Back Pain and Headache    HPI Diane Warner is a 52 y.o. female who presents the emergency department complaining of headache, blurred vision, nausea, neck, left shoulder, diffuse back pain, left hip pain status post a fall.  Patient reports that last night she had come out of her bathroom, when her grandkids had placed a small mattress in her path.  Patient reports that she did not see it, tripped over it, landed on her back striking her head on a concrete floor.  Patient reports that "I got my bowel wrong" but she did not lose consciousness.  Patient initially felt that if she "slept it off" symptoms were improved.  Patient has experienced headache, blurred vision, nausea, neck, left shoulder, mid and lower back pain, left hip pain since yesterday.  Patient reports that symptoms are not improving with over-the-counter medication use.  She has had no subsequent loss of consciousness.  Patient has had some numbness and tingling in the left upper extremity but denies any other numbness and tingling.  No other injury or complaint at this time.  Past Medical History:  Diagnosis Date  . Anxiety   . Arthritis   . Asthma   . Chronic right hip pain   . Degenerative disc disease, thoracic   . GERD (gastroesophageal reflux disease)   . Hypertension   . Irritable bowel syndrome (IBS)   . Scoliosis     Patient Active Problem List   Diagnosis Date Noted  . Anxiety 04/11/2016  . Arthritis 04/11/2016  . Asthma 04/11/2016  . Chronic right hip pain 04/11/2016  . Degenerative disc disease, thoracic 04/11/2016  . GERD (gastroesophageal reflux disease) 04/11/2016  . Hypertension 04/11/2016  . Irritable bowel syndrome (IBS) 04/11/2016  . Scoliosis  04/11/2016  . Chronic pain syndrome 04/11/2016  . Chronic bilateral low back pain without sciatica 04/11/2016  . Long term prescription benzodiazepine use 04/11/2016  . Encounter for opiate analgesic use agreement 04/11/2016  . Long term current use of opiate analgesic 04/11/2016  . Long term prescription opiate use 04/11/2016  . Opiate use 04/11/2016  . Obesity, Class II, BMI 35-39.9 04/11/2016    Past Surgical History:  Procedure Laterality Date  . ABDOMINAL HYSTERECTOMY    . CESAREAN SECTION  1985  . CHOLECYSTECTOMY    . TUBAL LIGATION      Prior to Admission medications   Medication Sig Start Date End Date Taking? Authorizing Provider  albuterol (PROVENTIL HFA;VENTOLIN HFA) 108 (90 Base) MCG/ACT inhaler Inhale 2 puffs into the lungs every 6 (six) hours as needed for wheezing or shortness of breath. 10/17/16   Orvil Feil, PA-C  Asenapine Maleate (SAPHRIS SL) Place 1 tablet under the tongue daily.      [provider]  benzonatate (TESSALON PERLES) 100 MG capsule Take 2 capsules (200 mg total) by mouth 3 (three) times daily as needed. 10/19/16 10/19/17  Tommi Rumps, PA-C  citalopram (CELEXA) 20 MG tablet Take 20 mg by mouth daily.      [provider]  diazepam (VALIUM) 2 MG tablet Take 1 tablet (2 mg total) by mouth every 8 (eight) hours as needed for muscle spasms. 01/18/17   Menshew, Charlesetta Ivory, PA-C  diclofenac sodium (VOLTAREN) 1 % GEL Apply 2 g topically 4 (four) times daily.  09/15/16   Enid DerryWagner, Ashley, PA-C  lidocaine (LIDODERM) 5 % Place 1 patch onto the skin every 12 (twelve) hours. Remove & Discard patch within 12 hours or as directed by MD 09/15/16 09/15/17  Enid DerryWagner, Ashley, PA-C  LORazepam (ATIVAN) 1 MG tablet Take 1 mg by mouth 2 (two) times daily.    [provider]  meloxicam (MOBIC) 15 MG tablet Take 1 tablet (15 mg total) by mouth daily. 05/29/17   Cuthriell, Delorise RoyalsJonathan D, PA-C  methocarbamol (ROBAXIN) 500 MG tablet Take 1 tablet (500 mg total)  by mouth 4 (four) times daily. 05/29/17   Cuthriell, Delorise RoyalsJonathan D, PA-C  oxyCODONE-acetaminophen (ROXICET) 5-325 MG tablet Take 1 tablet by mouth 2 times daily at 12 noon and 4 pm. 01/18/17   Menshew, Charlesetta IvoryJenise V Bacon, PA-C  PrednisoLONE 5 MG (48) TBPK Take 5 mg by mouth as directed. 12-day taper as directed 01/18/17   Menshew, Charlesetta IvoryJenise V Bacon, PA-C  predniSONE (DELTASONE) 10 MG tablet Take 6 tablets  today, on day 2 take 5 tablets, day 3 take 4 tablets, day 4 take 3 tablets, day 5 take  2 tablets and 1 tablet the last day 10/19/16   Tommi RumpsSummers, Rhonda L, PA-C  propranolol (INDERAL) 40 MG tablet Take 1 tablet (40 mg total) by mouth 2 (two) times daily. 09/20/15   Jennye MoccasinQuigley, Brian S, MD  ranitidine (ZANTAC) 150 MG tablet Take 150 mg by mouth 2 (two) times daily.      [provider]    Allergies Amoxicillin; Aspirin; Compazine; Dilaudid [hydromorphone hcl]; Penicillins; Proton pump inhibitors; and Zofran [ondansetron hcl]  No family history on file.  Social History Social History   Tobacco Use  . Smoking status: Former Smoker    Last attempt to quit: 02/16/2014    Years since quitting: 3.2  . Smokeless tobacco: Never Used  Substance Use Topics  . Alcohol use: No  . Drug use: No     Review of Systems  Constitutional: No fever/chills Eyes: Positive for blurred vision no discharge ENT: No upper respiratory complaints. Cardiovascular: no chest pain. Respiratory: no cough. No SOB. Gastrointestinal: No abdominal pain.  No nausea, no vomiting. Musculoskeletal: Deferred neck, left shoulder, mid and lower back pain, left hip pain Skin: Negative for rash, abrasions, lacerations, ecchymosis. Neurological: Negative for headaches, focal weakness or numbness. 10-point ROS otherwise negative.  ____________________________________________   PHYSICAL EXAM:  VITAL SIGNS: ED Triage Vitals  Enc Vitals Group     BP 05/29/17 2032 (!) 159/93     Pulse Rate 05/29/17 2032 84     Resp 05/29/17 2032 20      Temp 05/29/17 2032 98.3 F (36.8 C)     Temp Source 05/29/17 2032 Oral     SpO2 05/29/17 2032 97 %     Weight 05/29/17 2032 220 lb (99.8 kg)     Height 05/29/17 2032 5\' 4"  (1.626 m)     Head Circumference --      Peak Flow --      Pain Score 05/29/17 2031 8     Pain Loc --      Pain Edu? --      Excl. in GC? --      Constitutional: Alert and oriented. Well appearing and in no acute distress. Eyes: Conjunctivae are normal. PERRL. EOMI. Head: Atraumatic.  No visible signs of trauma.  Patient is mildly tender to palpation over the occipital skull region.  No palpable abnormality or crepitus.  No battle signs, raccoon eyes, serosanguineous  fluid drainage from the ears or ENT:      Ears:       Nose: No congestion/rhinnorhea.      Mouth/Throat: Mucous membranes are moist.  Neck: No stridor.  Diffuse midline cervical spine tenderness to palpation.  No palpable abnormality or step-off.  Radial pulse intact bilateral upper extremities.  Sensation intact and equal bilateral upper extremities.  Cardiovascular: Normal rate, regular rhythm. Normal S1 and S2.  Good peripheral circulation. Respiratory: Normal respiratory effort without tachypnea or retractions. Lungs CTAB. Good air entry to the bases with no decreased or absent breath sounds. Musculoskeletal: Full range of motion to all extremities. No gross deformities appreciated.  No gross deformity, edema, ecchymosis noted to the left shoulder upon inspection.  Full range of motion to the left shoulder.  Patient is diffusely tender to palpation along the scapular spine.  No other tenderness to palpation.  No palpable abnormality.  Visualization of the thoracic and lumbar spine reveals no acute visible abnormality.  Diffuse tenderness both midline and paraspinal muscle throughout thoracic and lumbar spine.  No specific point tenderness.  No palpable abnormality or step-off.  Patient is tender to palpation over the left-sided sciatic notch.  Patient is  also tender to palpation over the lateral aspect of the left hip with no palpable abnormality.  No tenderness to palpation along the pubic ramus.  No tenderness to palpation over the iliac crest.  Dorsalis pedis pulse intact bilateral lower extremities.  Sensation intact and equal bilateral lower extremities. Neurologic:  Normal speech and language. No gross focal neurologic deficits are appreciated.  Cranial nerves II through XII grossly intact.  Negative pronator drift and Romberg's. Skin:  Skin is warm, dry and intact. No rash noted. Psychiatric: Mood and affect are normal. Speech and behavior are normal. Patient exhibits appropriate insight and judgement.   ____________________________________________   LABS (all labs ordered are listed, but only abnormal results are displayed)  Labs Reviewed - No data to display ____________________________________________  EKG   ____________________________________________  RADIOLOGY Festus Barren Cuthriell, personally viewed and evaluated these images as part of my medical decision making, as well as reviewing the written report by the radiologist.  Dg Thoracic Spine 2 View  Result Date: 05/29/2017 CLINICAL DATA:  Fall.  Diffuse back pain. EXAM: THORACIC SPINE 2 VIEWS COMPARISON:  09/20/2015 chest CT. FINDINGS: Thoracic vertebral body heights appear preserved, with no evidence of thoracic spine fracture or subluxation. Mild thoracic spondylosis. No suspicious focal osseous lesions. Cholecystectomy clips are seen in the right upper quadrant of the abdomen. IMPRESSION: No thoracic spine fracture or subluxation. Electronically Signed   By: Delbert Phenix M.D.   On: 05/29/2017 22:15   Dg Lumbar Spine Complete  Result Date: 05/29/2017 CLINICAL DATA:  52 y/o  F; fall with diffuse back pain. EXAM: LUMBAR SPINE - COMPLETE 4+ VIEW COMPARISON:  01/18/2017 lumbar radiographs FINDINGS: Five lumbar type non-rib-bearing vertebral bodies. Stable mild lumbar spine  dextrocurvature with apex at L3. Normal lumbar lordosis without listhesis. No loss of vertebral body height. Mild loss of L3-4 and L4-5 disc space height with small marginal osteophytes. No acute fracture. IMPRESSION: 1. No acute fracture or dislocation. 2. Stable mild lumbar spondylosis greatest at L3-4 and L4-5 levels. Electronically Signed   By: Mitzi Hansen M.D.   On: 05/29/2017 22:17   Ct Head Wo Contrast  Result Date: 05/29/2017 CLINICAL DATA:  52 y/o F; fall with head injury. No loss of consciousness. Neck and left shoulder pain with numbness  and tingling running to the left hand. EXAM: CT HEAD WITHOUT CONTRAST CT CERVICAL SPINE WITHOUT CONTRAST TECHNIQUE: Multidetector CT imaging of the head and cervical spine was performed following the standard protocol without intravenous contrast. Multiplanar CT image reconstructions of the cervical spine were also generated. COMPARISON:  11/09/2015 CT head. FINDINGS: CT HEAD FINDINGS Brain: No evidence of acute infarction, hemorrhage, hydrocephalus, extra-axial collection or mass lesion/mass effect. Vascular: No hyperdense vessel or unexpected calcification. Skull: Normal. Negative for fracture or focal lesion. Sinuses/Orbits: No acute finding. Other: None. CT CERVICAL SPINE FINDINGS Alignment: Straightening of cervical lordosis without listhesis. Skull base and vertebrae: No acute fracture. No primary bone lesion or focal pathologic process. Soft tissues and spinal canal: No prevertebral fluid or swelling. No visible canal hematoma. Disc levels: Mild discogenic degenerative changes at C5-6 level and facet hypertrophy at the right C7-T1 level. Mild C5-6 bony canal stenosis. Upper chest: Negative. Other: Thyroid left lower pole ill-defined nodularity. IMPRESSION: 1. Normal CT of the head. 2. No acute fracture or dislocation of cervical spine. 3. Mild cervical spondylosis at C5-6 level. 4. Thyroid left lower pole ill-defined nodularity, consider further  evaluation with thyroid ultrasound on nonemergent basis. Electronically Signed   By: Mitzi Hansen M.D.   On: 05/29/2017 22:29   Ct Cervical Spine Wo Contrast  Result Date: 05/29/2017 CLINICAL DATA:  52 y/o F; fall with head injury. No loss of consciousness. Neck and left shoulder pain with numbness and tingling running to the left hand. EXAM: CT HEAD WITHOUT CONTRAST CT CERVICAL SPINE WITHOUT CONTRAST TECHNIQUE: Multidetector CT imaging of the head and cervical spine was performed following the standard protocol without intravenous contrast. Multiplanar CT image reconstructions of the cervical spine were also generated. COMPARISON:  11/09/2015 CT head. FINDINGS: CT HEAD FINDINGS Brain: No evidence of acute infarction, hemorrhage, hydrocephalus, extra-axial collection or mass lesion/mass effect. Vascular: No hyperdense vessel or unexpected calcification. Skull: Normal. Negative for fracture or focal lesion. Sinuses/Orbits: No acute finding. Other: None. CT CERVICAL SPINE FINDINGS Alignment: Straightening of cervical lordosis without listhesis. Skull base and vertebrae: No acute fracture. No primary bone lesion or focal pathologic process. Soft tissues and spinal canal: No prevertebral fluid or swelling. No visible canal hematoma. Disc levels: Mild discogenic degenerative changes at C5-6 level and facet hypertrophy at the right C7-T1 level. Mild C5-6 bony canal stenosis. Upper chest: Negative. Other: Thyroid left lower pole ill-defined nodularity. IMPRESSION: 1. Normal CT of the head. 2. No acute fracture or dislocation of cervical spine. 3. Mild cervical spondylosis at C5-6 level. 4. Thyroid left lower pole ill-defined nodularity, consider further evaluation with thyroid ultrasound on nonemergent basis. Electronically Signed   By: Mitzi Hansen M.D.   On: 05/29/2017 22:29   Dg Shoulder Left  Result Date: 05/29/2017 CLINICAL DATA:  Fall with shoulder pain EXAM: LEFT SHOULDER - 2+ VIEW  COMPARISON:  None. FINDINGS: There is no evidence of fracture or dislocation. There is no evidence of arthropathy or other focal bone abnormality. Soft tissues are unremarkable. IMPRESSION: Negative. Electronically Signed   By: Jasmine Pang M.D.   On: 05/29/2017 22:15   Dg Hip Unilat W Or Wo Pelvis 2-3 Views Left  Result Date: 05/29/2017 CLINICAL DATA:  Fall EXAM: DG HIP (WITH OR WITHOUT PELVIS) 2-3V LEFT COMPARISON:  None. FINDINGS: No acute displaced fracture or malalignment is seen. Pubic symphysis and rami are intact. SI joints are symmetric. Calcified pelvic phleboliths. IMPRESSION: No acute osseous abnormality. Electronically Signed   By: Adrian Prows.D.  On: 05/29/2017 22:17    ____________________________________________    PROCEDURES  Procedure(s) performed:    Procedures    Medications  ketorolac (TORADOL) 30 MG/ML injection 30 mg (not administered)  diphenhydrAMINE (BENADRYL) injection 50 mg (not administered)  methocarbamol (ROBAXIN) tablet 1,000 mg (not administered)     ____________________________________________   INITIAL IMPRESSION / ASSESSMENT AND PLAN / ED COURSE  Pertinent labs & imaging results that were available during my care of the patient were reviewed by me and considered in my medical decision making (see chart for details).  Review of the [place] CSRS was performed in accordance of the [facility] prior to dispensing any controlled drugs.     Patient's diagnosis is consistent with a fall resulting in concussion, strain of the cervical neck muscles, cervical radiculopathy, contusion of the left hip and shoulder.  Patient presented to the emergency department with multiple complaints.  Initial differential included concussion, skull fracture, head bleed, cervical spine fracture, T-spine fracture, L-spine fracture, cervical radiculopathy, shoulder fracture, left hip fracture.  Imaging returned with reassuring results with no indication of acute intracranial  osseous abnormality.  Patient is given medications in the emergency department for symptom relief.. Patient will be discharged home with prescriptions for meloxicam and Robaxin for symptom control. Patient is to follow up with primary care as needed or otherwise directed. Patient is given ED precautions to return to the ED for any worsening or new symptoms.     ____________________________________________  FINAL CLINICAL IMPRESSION(S) / ED DIAGNOSES  Final diagnoses:  Fall, initial encounter  Concussion without loss of consciousness, initial encounter  Strain of neck muscle, initial encounter  Cervical radiculopathy  Contusion of left shoulder, initial encounter  Contusion of left hip, initial encounter      NEW MEDICATIONS STARTED DURING THIS VISIT:  ED Discharge Orders        Ordered    meloxicam (MOBIC) 15 MG tablet  Daily     05/29/17 2255    methocarbamol (ROBAXIN) 500 MG tablet  4 times daily     05/29/17 2255          This chart was dictated using voice recognition software/Dragon. Despite best efforts to proofread, errors can occur which can change the meaning. Any change was purely unintentional.    Racheal Patches, PA-C 05/29/17 2354    Don Perking, [place], MD 05/31/17 [phone]

## 2017-05-29 NOTE — ED Notes (Signed)
Pt presents s/p trip and fall last night. Pt reports tripping over a mattress on the floor; pt reports back and LEFT shoulder pain. Pt states she hit the back of her head but no LOC. Pt reports h/x of LEFT shoulder problems prior to fall.

## 2017-08-25 ENCOUNTER — Encounter: Payer: Self-pay | Admitting: Emergency Medicine

## 2017-08-25 ENCOUNTER — Emergency Department
Admission: EM | Admit: 2017-08-25 | Discharge: 2017-08-25 | Disposition: A | Discharge status: Home / Self Care | Payer: Self-pay | Attending: Emergency Medicine | Admitting: Emergency Medicine

## 2017-08-25 DIAGNOSIS — I10 Essential (primary) hypertension: Secondary | ICD-10-CM | POA: Insufficient documentation

## 2017-08-25 DIAGNOSIS — J45909 Unspecified asthma, uncomplicated: Secondary | ICD-10-CM | POA: Insufficient documentation

## 2017-08-25 DIAGNOSIS — Z79899 Other long term (current) drug therapy: Secondary | ICD-10-CM | POA: Insufficient documentation

## 2017-08-25 DIAGNOSIS — Z87891 Personal history of nicotine dependence: Secondary | ICD-10-CM | POA: Insufficient documentation

## 2017-08-25 DIAGNOSIS — A084 Viral intestinal infection, unspecified: Secondary | ICD-10-CM | POA: Insufficient documentation

## 2017-08-25 LAB — CBC
HCT: 45.4 % (ref 35.0–47.0)
HEMOGLOBIN: 15.2 g/dL (ref 12.0–16.0)
MCH: 30.6 pg (ref 26.0–34.0)
MCHC: 33.4 g/dL (ref 32.0–36.0)
MCV: 91.5 fL (ref 80.0–100.0)
Platelets: 283 10*3/uL (ref 150–440)
RBC: 4.96 MIL/uL (ref 3.80–5.20)
RDW: 14.4 % (ref 11.5–14.5)
WBC: 8.4 10*3/uL (ref 3.6–11.0)

## 2017-08-25 LAB — URINALYSIS, COMPLETE (UACMP) WITH MICROSCOPIC
BILIRUBIN URINE: NEGATIVE
GLUCOSE, UA: NEGATIVE mg/dL
KETONES UR: NEGATIVE mg/dL
Nitrite: NEGATIVE
PROTEIN: NEGATIVE mg/dL
Specific Gravity, Urine: 1.024 (ref 1.005–1.030)
pH: 6 (ref 5.0–8.0)

## 2017-08-25 LAB — COMPREHENSIVE METABOLIC PANEL
ALT: 12 U/L — ABNORMAL LOW (ref 14–54)
ANION GAP: 6 (ref 5–15)
AST: 27 U/L (ref 15–41)
Albumin: 4.2 g/dL (ref 3.5–5.0)
Alkaline Phosphatase: 75 U/L (ref 38–126)
BUN: 11 mg/dL (ref 6–20)
CHLORIDE: 104 mmol/L (ref 101–111)
CO2: 26 mmol/L (ref 22–32)
Calcium: 8.5 mg/dL — ABNORMAL LOW (ref 8.9–10.3)
Creatinine, Ser: 0.88 mg/dL (ref 0.44–1.00)
GFR calc non Af Amer: >60 mL/min (ref >60)
Glucose, Bld: 122 mg/dL — ABNORMAL HIGH (ref 65–99)
POTASSIUM: 3.3 mmol/L — AB (ref 3.5–5.1)
SODIUM: 136 mmol/L (ref 135–145)
Total Bilirubin: 0.7 mg/dL (ref 0.3–1.2)
Total Protein: 7.8 g/dL (ref 6.5–8.1)

## 2017-08-25 LAB — LIPASE, BLOOD: LIPASE: 29 U/L (ref 11–51)

## 2017-08-25 MED ORDER — SODIUM CHLORIDE 0.9 % IV SOLN
1000.0000 mL | Freq: Once | INTRAVENOUS | Status: AC
  Administered 2017-08-25: 1000 mL via INTRAVENOUS

## 2017-08-25 MED ORDER — PROMETHAZINE HCL 12.5 MG PO TABS: 12.5000 mg | ORAL_TABLET | Freq: Four times a day (QID) | ORAL | 0 refills | Status: DC | PRN

## 2017-08-25 MED ORDER — METOCLOPRAMIDE HCL 5 MG/ML IJ SOLN
10.0000 mg | Freq: Once | INTRAMUSCULAR | Status: AC
  Administered 2017-08-25: 10 mg via INTRAVENOUS
  Filled 2017-08-25: qty 2

## 2017-08-25 NOTE — ED Notes (Signed)
Patient is resting on stretcher, NS infusing without difficulty.

## 2017-08-25 NOTE — ED Provider Notes (Addendum)
[facility] Emergency Department Provider Note   ____________________________________________    I have reviewed the triage vital signs and the nursing notes.   HISTORY  Chief Complaint Emesis and Diarrhea     HPI Diane Warner is a 52 y.o. female who presents with complaints of nausea vomiting and diarrhea.  Patient reports she has been up all night with the symptoms, describes diffuse abdominal cramping.  Took some Tylenol because she had a fever of 102.  Does report myalgias as well.  Sick contacts at work had similar symptoms.  No recent travel.  She is unable to tolerate p.o.'s   Past Medical History:  Diagnosis Date  . Anxiety   . Arthritis   . Asthma   . Chronic right hip pain   . Degenerative disc disease, thoracic   . GERD (gastroesophageal reflux disease)   . Hypertension   . Irritable bowel syndrome (IBS)   . Scoliosis     Patient Active Problem List   Diagnosis Date Noted  . Anxiety 04/11/2016  . Arthritis 04/11/2016  . Asthma 04/11/2016  . Chronic right hip pain 04/11/2016  . Degenerative disc disease, thoracic 04/11/2016  . GERD (gastroesophageal reflux disease) 04/11/2016  . Hypertension 04/11/2016  . Irritable bowel syndrome (IBS) 04/11/2016  . Scoliosis 04/11/2016  . Chronic pain syndrome 04/11/2016  . Chronic bilateral low back pain without sciatica 04/11/2016  . Long term prescription benzodiazepine use 04/11/2016  . Encounter for opiate analgesic use agreement 04/11/2016  . Long term current use of opiate analgesic 04/11/2016  . Long term prescription opiate use 04/11/2016  . Opiate use 04/11/2016  . Obesity, Class II, BMI 35-39.9 04/11/2016    Past Surgical History:  Procedure Laterality Date  . ABDOMINAL HYSTERECTOMY    . CESAREAN SECTION  1985  . CHOLECYSTECTOMY    . TUBAL LIGATION      Prior to Admission medications   Medication Sig Start Date End Date Taking? Authorizing Provider  albuterol  (PROVENTIL HFA;VENTOLIN HFA) 108 (90 Base) MCG/ACT inhaler Inhale 2 puffs into the lungs every 6 (six) hours as needed for wheezing or shortness of breath. 10/17/16   Orvil Feil, PA-C  Asenapine Maleate (SAPHRIS SL) Place 1 tablet under the tongue daily.      [provider]  benzonatate (TESSALON PERLES) 100 MG capsule Take 2 capsules (200 mg total) by mouth 3 (three) times daily as needed. 10/19/16 10/19/17  Tommi Rumps, PA-C  citalopram (CELEXA) 20 MG tablet Take 20 mg by mouth daily.      [provider]  diazepam (VALIUM) 2 MG tablet Take 1 tablet (2 mg total) by mouth every 8 (eight) hours as needed for muscle spasms. 01/18/17   Menshew, Charlesetta Ivory, PA-C  diclofenac sodium (VOLTAREN) 1 % GEL Apply 2 g topically 4 (four) times daily. 09/15/16   Enid Derry, PA-C  lidocaine (LIDODERM) 5 % Place 1 patch onto the skin every 12 (twelve) hours. Remove & Discard patch within 12 hours or as directed by MD 09/15/16 09/15/17  Enid Derry, PA-C  LORazepam (ATIVAN) 1 MG tablet Take 1 mg by mouth 2 (two) times daily.    [provider]  meloxicam (MOBIC) 15 MG tablet Take 1 tablet (15 mg total) by mouth daily. 05/29/17   Cuthriell, Delorise Royals, PA-C  methocarbamol (ROBAXIN) 500 MG tablet Take 1 tablet (500 mg total) by mouth 4 (four) times daily. 05/29/17   Cuthriell, Delorise Royals, PA-C  oxyCODONE-acetaminophen (  ROXICET) 5-325 MG tablet Take 1 tablet by mouth 2 times daily at 12 noon and 4 pm. 01/18/17   Menshew, Charlesetta Ivory, PA-C  PrednisoLONE 5 MG (48) TBPK Take 5 mg by mouth as directed. 12-day taper as directed 01/18/17   Menshew, Charlesetta Ivory, PA-C  predniSONE (DELTASONE) 10 MG tablet Take 6 tablets  today, on day 2 take 5 tablets, day 3 take 4 tablets, day 4 take 3 tablets, day 5 take  2 tablets and 1 tablet the last day 10/19/16   Tommi Rumps, PA-C  promethazine (PHENERGAN) 12.5 MG tablet Take 1 tablet (12.5 mg total) by mouth every 6 (six) hours as needed for nausea or  vomiting. 08/25/17   Jene Every, MD  propranolol (INDERAL) 40 MG tablet Take 1 tablet (40 mg total) by mouth 2 (two) times daily. 09/20/15   Jennye Moccasin, MD  ranitidine (ZANTAC) 150 MG tablet Take 150 mg by mouth 2 (two) times daily.      [provider]     Allergies Amoxicillin; Aspirin; Compazine; Dilaudid [hydromorphone hcl]; Penicillins; Proton pump inhibitors; and Zofran [ondansetron hcl]  No family history on file.  Social History Social History   Tobacco Use  . Smoking status: Former Smoker    Last attempt to quit: 02/16/2014    Years since quitting: 3.5  . Smokeless tobacco: Never Used  Substance Use Topics  . Alcohol use: No  . Drug use: No    Review of Systems  Constitutional: No fever/chills Eyes: No visual changes.  ENT: No sore throat. Cardiovascular: Denies chest pain. Respiratory: Denies shortness of breath. Gastrointestinal: As above Genitourinary: Negative for dysuria. Musculoskeletal: Negative for back pain. Skin: Negative for rash. Neurological: Negative for headaches   ____________________________________________   PHYSICAL EXAM:  VITAL SIGNS: ED Triage Vitals  Enc Vitals Group     BP 08/25/17 1433 (!) 156/89     Pulse Rate 08/25/17 1433 87     Resp 08/25/17 1433 18     Temp 08/25/17 1433 98.6 F (37 C)     Temp Source 08/25/17 1433 Oral     SpO2 08/25/17 1433 99 %     Weight 08/25/17 1433 99.8 kg (220 lb)     Height 08/25/17 1433 1.626 m (5\' 4" )     Head Circumference --      Peak Flow --      Pain Score 08/25/17 1440 9     Pain Loc --      Pain Edu? --      Excl. in GC? --     Constitutional: Alert and oriented. No acute distress. Pleasant and interactive Eyes: Conjunctivae are normal.   Nose: No congestion/rhinnorhea. Mouth/Throat: Mucous membranes are moist.    Cardiovascular: Normal rate, regular rhythm. Grossly normal heart sounds.  Good peripheral circulation. Respiratory: Normal respiratory effort.  No  retractions. Lungs CTAB. Gastrointestinal: Soft and nontender. No distention.  No CVA tenderness. Genitourinary: deferred Musculoskeletal: No lower extremity tenderness nor edema.  Warm and well perfused Neurologic:  Normal speech and language. No gross focal neurologic deficits are appreciated.  Skin:  Skin is warm, dry and intact. No rash noted. Psychiatric: Mood and affect are normal. Speech and behavior are normal.  ____________________________________________   LABS (all labs ordered are listed, but only abnormal results are displayed)  Labs Reviewed  COMPREHENSIVE METABOLIC PANEL - Abnormal; Notable for the following components:      Result Value   Potassium 3.3 (*)  Glucose, Bld 122 (*)    Calcium 8.5 (*)    ALT 12 (*)    All other components within normal limits  URINALYSIS, COMPLETE (UACMP) WITH MICROSCOPIC - Abnormal; Notable for the following components:   Color, Urine YELLOW (*)    APPearance HAZY (*)    Hgb urine dipstick SMALL (*)    Leukocytes, UA LARGE (*)    Bacteria, UA RARE (*)    Squamous Epithelial / LPF 6-30 (*)    All other components within normal limits  LIPASE, BLOOD  CBC   ____________________________________________  EKG  None ____________________________________________  RADIOLOGY  None ____________________________________________   PROCEDURES  Procedure(s) performed: No  Procedures   Critical Care performed: No ____________________________________________   INITIAL IMPRESSION / ASSESSMENT AND PLAN / ED COURSE  Pertinent labs & imaging results that were available during my care of the patient were reviewed by me and considered in my medical decision making (see chart for details).  Patient overall well-appearing in no acute distress.  Symptoms most consistent with viral gastroenteritis, no abdominal tenderness to palpation.  Lab work overall reassuring.  We will give IV fluids IV Zofran and reevaluate.  Patient feeling  significantly better after IV fluids and IV Zofran.  Appropriate for discharge at this time with outpatient follow-up as necessary, return precautions discussed.  No dysuria, suspect contamination/dirty urine    ____________________________________________   FINAL CLINICAL IMPRESSION(S) / ED DIAGNOSES  Final diagnoses:  Viral gastroenteritis        Note:  This document was prepared using Dragon voice recognition software and may include unintentional dictation errors.    Jene EveryKinner, Katheline Brendlinger, MD 08/25/17 [place] [id number]    Jene EveryKinner, Tiyonna Sardinha, MD 08/25/17 [phone]

## 2017-08-25 NOTE — ED Triage Notes (Signed)
Patient presents to the ED with nausea, vomiting and diarrhea since 3am.  Patient reports headache and abdominal pain as well.  Patient reports someone at work and grandchildren have recently been sick with similar symptoms.

## 2017-08-25 NOTE — ED Notes (Signed)
Patient ambulated to the bathroom with a steady gait

## 2017-08-29 ENCOUNTER — Emergency Department: Payer: Self-pay

## 2017-08-29 ENCOUNTER — Other Ambulatory Visit: Payer: Self-pay

## 2017-08-29 ENCOUNTER — Encounter: Payer: Self-pay | Admitting: Emergency Medicine

## 2017-08-29 ENCOUNTER — Inpatient Hospital Stay
Admission: EM | Admit: 2017-08-29 | Discharge: 2017-09-02 | DRG: 392 | Disposition: A | Discharge status: Home / Self Care | Payer: Self-pay | Attending: Internal Medicine | Admitting: Internal Medicine

## 2017-08-29 DIAGNOSIS — Z888 Allergy status to other drugs, medicaments and biological substances status: Secondary | ICD-10-CM

## 2017-08-29 DIAGNOSIS — Z881 Allergy status to other antibiotic agents status: Secondary | ICD-10-CM

## 2017-08-29 DIAGNOSIS — Z885 Allergy status to narcotic agent status: Secondary | ICD-10-CM

## 2017-08-29 DIAGNOSIS — G43909 Migraine, unspecified, not intractable, without status migrainosus: Secondary | ICD-10-CM | POA: Diagnosis present

## 2017-08-29 DIAGNOSIS — N3001 Acute cystitis with hematuria: Secondary | ICD-10-CM | POA: Diagnosis present

## 2017-08-29 DIAGNOSIS — Z880 Allergy status to penicillin: Secondary | ICD-10-CM

## 2017-08-29 DIAGNOSIS — E876 Hypokalemia: Secondary | ICD-10-CM | POA: Diagnosis present

## 2017-08-29 DIAGNOSIS — Z79899 Other long term (current) drug therapy: Secondary | ICD-10-CM

## 2017-08-29 DIAGNOSIS — Z6839 Body mass index (BMI) 39.0-39.9, adult: Secondary | ICD-10-CM

## 2017-08-29 DIAGNOSIS — J45909 Unspecified asthma, uncomplicated: Secondary | ICD-10-CM | POA: Diagnosis present

## 2017-08-29 DIAGNOSIS — K219 Gastro-esophageal reflux disease without esophagitis: Secondary | ICD-10-CM | POA: Diagnosis present

## 2017-08-29 DIAGNOSIS — I10 Essential (primary) hypertension: Secondary | ICD-10-CM | POA: Diagnosis present

## 2017-08-29 DIAGNOSIS — E669 Obesity, unspecified: Secondary | ICD-10-CM | POA: Diagnosis present

## 2017-08-29 DIAGNOSIS — Z886 Allergy status to analgesic agent status: Secondary | ICD-10-CM

## 2017-08-29 DIAGNOSIS — R739 Hyperglycemia, unspecified: Secondary | ICD-10-CM | POA: Diagnosis present

## 2017-08-29 DIAGNOSIS — G8929 Other chronic pain: Secondary | ICD-10-CM | POA: Diagnosis present

## 2017-08-29 DIAGNOSIS — N390 Urinary tract infection, site not specified: Secondary | ICD-10-CM

## 2017-08-29 DIAGNOSIS — F1410 Cocaine abuse, uncomplicated: Secondary | ICD-10-CM | POA: Diagnosis present

## 2017-08-29 DIAGNOSIS — M1990 Unspecified osteoarthritis, unspecified site: Secondary | ICD-10-CM | POA: Diagnosis present

## 2017-08-29 DIAGNOSIS — Z791 Long term (current) use of non-steroidal anti-inflammatories (NSAID): Secondary | ICD-10-CM

## 2017-08-29 DIAGNOSIS — R112 Nausea with vomiting, unspecified: Secondary | ICD-10-CM | POA: Diagnosis present

## 2017-08-29 DIAGNOSIS — E860 Dehydration: Secondary | ICD-10-CM | POA: Diagnosis present

## 2017-08-29 DIAGNOSIS — Z87891 Personal history of nicotine dependence: Secondary | ICD-10-CM

## 2017-08-29 DIAGNOSIS — K529 Noninfective gastroenteritis and colitis, unspecified: Principal | ICD-10-CM | POA: Diagnosis present

## 2017-08-29 LAB — BASIC METABOLIC PANEL
Anion gap: 8 (ref 5–15)
BUN: 12 mg/dL (ref 6–20)
CHLORIDE: 103 mmol/L (ref 101–111)
CO2: 26 mmol/L (ref 22–32)
Calcium: 8.8 mg/dL — ABNORMAL LOW (ref 8.9–10.3)
Creatinine, Ser: 0.89 mg/dL (ref 0.44–1.00)
GFR calc non Af Amer: >60 mL/min (ref >60)
Glucose, Bld: 126 mg/dL — ABNORMAL HIGH (ref 65–99)
POTASSIUM: 3.1 mmol/L — AB (ref 3.5–5.1)
SODIUM: 137 mmol/L (ref 135–145)

## 2017-08-29 LAB — URINALYSIS, COMPLETE (UACMP) WITH MICROSCOPIC
BACTERIA UA: NONE SEEN
Bilirubin Urine: NEGATIVE
Glucose, UA: NEGATIVE mg/dL
KETONES UR: NEGATIVE mg/dL
Nitrite: NEGATIVE
PROTEIN: NEGATIVE mg/dL
Specific Gravity, Urine: 1.009 (ref 1.005–1.030)
pH: 6 (ref 5.0–8.0)

## 2017-08-29 LAB — DIFFERENTIAL
BASOS ABS: 0 10*3/uL (ref 0–0.1)
BASOS PCT: 0 %
EOS ABS: 0.6 10*3/uL (ref 0–0.7)
Eosinophils Relative: 7 %
Lymphocytes Relative: 19 %
Lymphs Abs: 1.5 10*3/uL (ref 1.0–3.6)
MONOS PCT: 8 %
Monocytes Absolute: 0.6 10*3/uL (ref 0.2–0.9)
NEUTROS ABS: 5.3 10*3/uL (ref 1.4–6.5)
NEUTROS PCT: 66 %

## 2017-08-29 LAB — CBC
HEMATOCRIT: 46.1 % (ref 35.0–47.0)
Hemoglobin: 15.4 g/dL (ref 12.0–16.0)
MCH: 30.5 pg (ref 26.0–34.0)
MCHC: 33.5 g/dL (ref 32.0–36.0)
MCV: 91.1 fL (ref 80.0–100.0)
Platelets: 258 10*3/uL (ref 150–440)
RBC: 5.05 MIL/uL (ref 3.80–5.20)
RDW: 14.1 % (ref 11.5–14.5)
WBC: 7.9 10*3/uL (ref 3.6–11.0)

## 2017-08-29 LAB — HEPATIC FUNCTION PANEL
ALBUMIN: 4.1 g/dL (ref 3.5–5.0)
ALT: 10 U/L — ABNORMAL LOW (ref 14–54)
AST: 20 U/L (ref 15–41)
Alkaline Phosphatase: 74 U/L (ref 38–126)
BILIRUBIN DIRECT: 0.1 mg/dL (ref 0.1–0.5)
Indirect Bilirubin: 0.7 mg/dL (ref 0.3–0.9)
Total Bilirubin: 0.8 mg/dL (ref 0.3–1.2)
Total Protein: 7.9 g/dL (ref 6.5–8.1)

## 2017-08-29 LAB — INFLUENZA PANEL BY PCR (TYPE A & B)
INFLAPCR: NEGATIVE
INFLBPCR: NEGATIVE

## 2017-08-29 MED ORDER — SODIUM CHLORIDE 0.9 % IV SOLN
1.0000 g | INTRAVENOUS | Status: DC
  Administered 2017-08-30 – 2017-09-01 (×3): 1 g via INTRAVENOUS
  Filled 2017-08-29 (×4): qty 10

## 2017-08-29 MED ORDER — ENOXAPARIN SODIUM 40 MG/0.4ML ~~LOC~~ SOLN
40.0000 mg | SUBCUTANEOUS | Status: DC
  Administered 2017-08-30: 40 mg via SUBCUTANEOUS
  Filled 2017-08-29 (×2): qty 0.4

## 2017-08-29 MED ORDER — ACETAMINOPHEN 325 MG PO TABS
650.0000 mg | ORAL_TABLET | Freq: Once | ORAL | Status: AC
  Administered 2017-08-29: 650 mg via ORAL

## 2017-08-29 MED ORDER — KETOROLAC TROMETHAMINE 30 MG/ML IJ SOLN
30.0000 mg | Freq: Once | INTRAMUSCULAR | Status: AC
  Administered 2017-08-29: 30 mg via INTRAVENOUS
  Filled 2017-08-29: qty 1

## 2017-08-29 MED ORDER — PROMETHAZINE HCL 25 MG PO TABS
12.5000 mg | ORAL_TABLET | Freq: Four times a day (QID) | ORAL | Status: DC | PRN
  Administered 2017-08-30 – 2017-09-02 (×8): 12.5 mg via ORAL
  Filled 2017-08-29 (×8): qty 1

## 2017-08-29 MED ORDER — MORPHINE SULFATE (PF) 4 MG/ML IV SOLN
4.0000 mg | Freq: Once | INTRAVENOUS | Status: AC
  Administered 2017-08-29: 4 mg via INTRAVENOUS

## 2017-08-29 MED ORDER — DIAZEPAM 2 MG PO TABS: 2.0000 mg | ORAL_TABLET | Freq: Three times a day (TID) | ORAL | Status: DC | PRN

## 2017-08-29 MED ORDER — ONDANSETRON HCL 4 MG/2ML IJ SOLN: 4.0000 mg | Freq: Four times a day (QID) | INTRAMUSCULAR | Status: DC | PRN

## 2017-08-29 MED ORDER — CIPROFLOXACIN IN D5W 400 MG/200ML IV SOLN
INTRAVENOUS | Status: AC
  Administered 2017-08-29: 400 mg via INTRAVENOUS
  Filled 2017-08-29: qty 200

## 2017-08-29 MED ORDER — PROMETHAZINE HCL 25 MG/ML IJ SOLN
12.5000 mg | Freq: Four times a day (QID) | INTRAMUSCULAR | Status: DC | PRN
  Administered 2017-08-30: 25 mg via INTRAVENOUS
  Administered 2017-08-30: 12.5 mg via INTRAVENOUS
  Filled 2017-08-29 (×3): qty 1

## 2017-08-29 MED ORDER — ACETAMINOPHEN 325 MG PO TABS: 650.0000 mg | ORAL_TABLET | Freq: Once | ORAL | Status: DC

## 2017-08-29 MED ORDER — ALBUTEROL SULFATE (2.5 MG/3ML) 0.083% IN NEBU: 2.5000 mg | INHALATION_SOLUTION | Freq: Four times a day (QID) | RESPIRATORY_TRACT | Status: DC | PRN

## 2017-08-29 MED ORDER — TRIMETHOBENZAMIDE HCL 100 MG/ML IM SOLN
200.0000 mg | Freq: Once | INTRAMUSCULAR | Status: AC
  Administered 2017-08-29: 200 mg via INTRAMUSCULAR
  Filled 2017-08-29: qty 2

## 2017-08-29 MED ORDER — METHOCARBAMOL 500 MG PO TABS
500.0000 mg | ORAL_TABLET | Freq: Four times a day (QID) | ORAL | Status: DC
  Administered 2017-08-30 – 2017-09-02 (×13): 500 mg via ORAL
  Filled 2017-08-29 (×19): qty 1

## 2017-08-29 MED ORDER — PROMETHAZINE HCL 25 MG/ML IJ SOLN
12.5000 mg | Freq: Once | INTRAMUSCULAR | Status: AC
  Administered 2017-08-29: 12.5 mg via INTRAVENOUS
  Filled 2017-08-29: qty 1

## 2017-08-29 MED ORDER — LACTATED RINGERS IV SOLN: INTRAVENOUS | Status: DC

## 2017-08-29 MED ORDER — ONDANSETRON HCL 4 MG PO TABS: 4.0000 mg | ORAL_TABLET | Freq: Four times a day (QID) | ORAL | Status: DC | PRN

## 2017-08-29 MED ORDER — SENNOSIDES-DOCUSATE SODIUM 8.6-50 MG PO TABS: 1.0000 | ORAL_TABLET | Freq: Every evening | ORAL | Status: DC | PRN

## 2017-08-29 MED ORDER — ONDANSETRON HCL 4 MG/2ML IJ SOLN
INTRAMUSCULAR | Status: AC
  Filled 2017-08-29: qty 2

## 2017-08-29 MED ORDER — MELOXICAM 7.5 MG PO TABS
15.0000 mg | ORAL_TABLET | Freq: Every day | ORAL | Status: DC
  Administered 2017-08-30 – 2017-09-02 (×4): 15 mg via ORAL
  Filled 2017-08-29 (×4): qty 2

## 2017-08-29 MED ORDER — PROPRANOLOL HCL 40 MG PO TABS
40.0000 mg | ORAL_TABLET | Freq: Two times a day (BID) | ORAL | Status: DC
  Administered 2017-08-30: 40 mg via ORAL
  Filled 2017-08-29 (×2): qty 1

## 2017-08-29 MED ORDER — SODIUM CHLORIDE 0.9 % IV BOLUS
1000.0000 mL | Freq: Once | INTRAVENOUS | Status: AC
Start: 2017-08-29 — End: 2017-08-29
  Administered 2017-08-29: 1000 mL via INTRAVENOUS

## 2017-08-29 MED ORDER — BISACODYL 5 MG PO TBEC: 5.0000 mg | DELAYED_RELEASE_TABLET | Freq: Every day | ORAL | Status: DC | PRN

## 2017-08-29 MED ORDER — CIPROFLOXACIN HCL 500 MG PO TABS
750.0000 mg | ORAL_TABLET | Freq: Once | ORAL | Status: AC
  Administered 2017-08-29: 750 mg via ORAL
  Filled 2017-08-29: qty 2

## 2017-08-29 MED ORDER — FAMOTIDINE 20 MG PO TABS
20.0000 mg | ORAL_TABLET | Freq: Two times a day (BID) | ORAL | Status: DC
  Administered 2017-08-30 – 2017-09-02 (×7): 20 mg via ORAL
  Filled 2017-08-29 (×7): qty 1

## 2017-08-29 MED ORDER — BENZONATATE 100 MG PO CAPS: 200.0000 mg | ORAL_CAPSULE | Freq: Three times a day (TID) | ORAL | Status: DC | PRN

## 2017-08-29 MED ORDER — POTASSIUM CHLORIDE CRYS ER 20 MEQ PO TBCR
40.0000 meq | EXTENDED_RELEASE_TABLET | Freq: Once | ORAL | Status: AC
  Administered 2017-08-30: 40 meq via ORAL
  Filled 2017-08-29: qty 2

## 2017-08-29 MED ORDER — OXYCODONE-ACETAMINOPHEN 5-325 MG PO TABS
1.0000 | ORAL_TABLET | ORAL | Status: DC | PRN
  Administered 2017-08-30 – 2017-09-02 (×11): 1 via ORAL
  Filled 2017-08-29 (×11): qty 1

## 2017-08-29 MED ORDER — TRIMETHOBENZAMIDE HCL 300 MG PO CAPS
300.0000 mg | ORAL_CAPSULE | Freq: Once | ORAL | Status: DC
  Filled 2017-08-29: qty 1

## 2017-08-29 MED ORDER — SODIUM CHLORIDE 0.9 % IV BOLUS
1000.0000 mL | Freq: Once | INTRAVENOUS | Status: AC
  Administered 2017-08-29: 1000 mL via INTRAVENOUS

## 2017-08-29 MED ORDER — POTASSIUM CHLORIDE 2 MEQ/ML IV SOLN
INTRAVENOUS | Status: DC
  Administered 2017-08-30: 01:00:00 via INTRAVENOUS
  Filled 2017-08-29: qty 1000

## 2017-08-29 MED ORDER — LORAZEPAM 1 MG PO TABS
1.0000 mg | ORAL_TABLET | Freq: Two times a day (BID) | ORAL | Status: DC
  Administered 2017-08-30 – 2017-09-02 (×7): 1 mg via ORAL
  Filled 2017-08-29 (×7): qty 1

## 2017-08-29 MED ORDER — ACETAMINOPHEN 650 MG RE SUPP: 650.0000 mg | Freq: Four times a day (QID) | RECTAL | Status: DC | PRN

## 2017-08-29 MED ORDER — CIPROFLOXACIN IN D5W 400 MG/200ML IV SOLN
400.0000 mg | Freq: Once | INTRAVENOUS | Status: AC
  Administered 2017-08-29: 400 mg via INTRAVENOUS

## 2017-08-29 MED ORDER — ACETAMINOPHEN 325 MG PO TABS
ORAL_TABLET | ORAL | Status: AC
  Administered 2017-08-29: 650 mg via ORAL
  Filled 2017-08-29: qty 2

## 2017-08-29 MED ORDER — POTASSIUM CHLORIDE 10 MEQ/100ML IV SOLN
10.0000 meq | INTRAVENOUS | Status: DC
  Filled 2017-08-29 (×6): qty 100

## 2017-08-29 MED ORDER — ACETAMINOPHEN 325 MG PO TABS
650.0000 mg | ORAL_TABLET | Freq: Four times a day (QID) | ORAL | Status: DC | PRN
  Administered 2017-08-30 – 2017-08-31 (×2): 650 mg via ORAL
  Filled 2017-08-29 (×2): qty 2

## 2017-08-29 MED ORDER — MORPHINE SULFATE (PF) 2 MG/ML IV SOLN
INTRAVENOUS | Status: AC
  Filled 2017-08-29: qty 2

## 2017-08-29 NOTE — ED Notes (Signed)
Pt in room vomiting at this time. Pt unable to keep fluids or medication down. MD aware.

## 2017-08-29 NOTE — ED Provider Notes (Addendum)
[facility] Emergency Department Provider Note    ____________________________________________   First MD Initiated Contact with Patient 08/29/17 1444     (approximate)  I have reviewed the triage vital signs and the nursing notes.   HISTORY  Chief Complaint Emesis; Generalized Body Aches; Headache; Cough; and Weakness   HPI Diane Warner is a 52 y.o. female who complains of generalized achiness everywhere in her body including her headWho complains of generalized achiness everywhere in her body including her head nausea nausea vomiting and diarrhea since Friday.nausea vomiting and diarrhea since Friday.  She says she feels weak and lightheaded like she might pass out when she stands.  She does not think she is running a fever.   She doesn't think she's running a fever.multiple family members  Multiple family members are sick with similar symptoms  are sick with similar symptomsshe says she feels weak and lightheaded like she might pass out when she stands.  Past Medical History:  Diagnosis Date  . Anxiety   . Arthritis   . Asthma   . Chronic right hip pain   . Degenerative disc disease, thoracic   . GERD (gastroesophageal reflux disease)   . Hypertension   . Irritable bowel syndrome (IBS)   . Scoliosis     Patient Active Problem List   Diagnosis Date Noted  . Anxiety 04/11/2016  . Arthritis 04/11/2016  . Asthma 04/11/2016  . Chronic right hip pain 04/11/2016  . Degenerative disc disease, thoracic 04/11/2016  . GERD (gastroesophageal reflux disease) 04/11/2016  . Hypertension 04/11/2016  . Irritable bowel syndrome (IBS) 04/11/2016  . Scoliosis 04/11/2016  . Chronic pain syndrome 04/11/2016  . Chronic bilateral low back pain without sciatica 04/11/2016  . Long term prescription benzodiazepine use 04/11/2016  . Encounter for opiate analgesic use agreement 04/11/2016  . Long term current use of opiate analgesic 04/11/2016  . Long term  prescription opiate use 04/11/2016  . Opiate use 04/11/2016  . Obesity, Class II, BMI 35-39.9 04/11/2016    Past Surgical History:  Procedure Laterality Date  . ABDOMINAL HYSTERECTOMY    . CESAREAN SECTION  1985  . CHOLECYSTECTOMY    . TUBAL LIGATION      Prior to Admission medications   Medication Sig Start Date End Date Taking? Authorizing Provider  albuterol (PROVENTIL HFA;VENTOLIN HFA) 108 (90 Base) MCG/ACT inhaler Inhale 2 puffs into the lungs every 6 (six) hours as needed for wheezing or shortness of breath. 10/17/16   Orvil Feil, PA-C  Asenapine Maleate (SAPHRIS SL) Place 1 tablet under the tongue daily.      [provider]  benzonatate (TESSALON PERLES) 100 MG capsule Take 2 capsules (200 mg total) by mouth 3 (three) times daily as needed. 10/19/16 10/19/17  Tommi Rumps, PA-C  citalopram (CELEXA) 20 MG tablet Take 20 mg by mouth daily.      [provider]  diazepam (VALIUM) 2 MG tablet Take 1 tablet (2 mg total) by mouth every 8 (eight) hours as needed for muscle spasms. 01/18/17   Menshew, Charlesetta Ivory, PA-C  diclofenac sodium (VOLTAREN) 1 % GEL Apply 2 g topically 4 (four) times daily. 09/15/16   Enid Derry, PA-C  lidocaine (LIDODERM) 5 % Place 1 patch onto the skin every 12 (twelve) hours. Remove & Discard patch within 12 hours or as directed by MD 09/15/16 09/15/17  Enid Derry, PA-C  LORazepam (ATIVAN) 1 MG tablet Take 1 mg by mouth 2 (two) times daily.  [provider]  meloxicam (MOBIC) 15 MG tablet Take 1 tablet (15 mg total) by mouth daily. 05/29/17   Cuthriell, Delorise Royals, PA-C  methocarbamol (ROBAXIN) 500 MG tablet Take 1 tablet (500 mg total) by mouth 4 (four) times daily. 05/29/17   Cuthriell, Delorise Royals, PA-C  oxyCODONE-acetaminophen (ROXICET) 5-325 MG tablet Take 1 tablet by mouth 2 times daily at 12 noon and 4 pm. 01/18/17   Menshew, Charlesetta Ivory, PA-C  PrednisoLONE 5 MG (48) TBPK Take 5 mg by mouth as directed. 12-day taper as  directed 01/18/17   Menshew, Charlesetta Ivory, PA-C  predniSONE (DELTASONE) 10 MG tablet Take 6 tablets  today, on day 2 take 5 tablets, day 3 take 4 tablets, day 4 take 3 tablets, day 5 take  2 tablets and 1 tablet the last day 10/19/16   Tommi Rumps, PA-C  promethazine (PHENERGAN) 12.5 MG tablet Take 1 tablet (12.5 mg total) by mouth every 6 (six) hours as needed for nausea or vomiting. 08/25/17   Jene Every, MD  propranolol (INDERAL) 40 MG tablet Take 1 tablet (40 mg total) by mouth 2 (two) times daily. 09/20/15   Jennye Moccasin, MD  ranitidine (ZANTAC) 150 MG tablet Take 150 mg by mouth 2 (two) times daily.      [provider]    Allergies Amoxicillin; Aspirin; Compazine; Dilaudid [hydromorphone hcl]; Penicillins; Proton pump inhibitors; and Zofran [ondansetron hcl]  No family history on file.  Social History Social History   Tobacco Use  . Smoking status: Former Smoker    Last attempt to quit: 02/16/2014    Years since quitting: 3.5  . Smokeless tobacco: Never Used  Substance Use Topics  . Alcohol use: No  . Drug use: No    Review of Systems  Constitutional: No fever/chills Eyes: No visual changes. ENT: No sore throat. Cardiovascular: Denies chest pain. Respiratory: Denies shortness of breath. Gastrointestinal: see history of present illnessSee HPI Genitourinary: Negative for dysuria. Musculoskeletal: Negative for back pain. Skin: Negative for rash. Neurological: Negative for headaches, focal weakness   ____________________________________________   PHYSICAL EXAM:  VITAL SIGNS: ED Triage Vitals  Enc Vitals Group     BP 08/29/17 1033 (!) 130/100     Pulse Rate 08/29/17 1033 (!) 109     Resp 08/29/17 1033 18     Temp 08/29/17 1033 98.7 F (37.1 C)     Temp Source 08/29/17 1033 Oral     SpO2 08/29/17 1033 97 %     Weight 08/29/17 1034 220 lb (99.8 kg)     Height 08/29/17 1034 5\' 4"  (1.626 m)     Head Circumference --      Peak Flow --      Pain  Score 08/29/17 1034 9     Pain Loc --      Pain Edu? --      Excl. in GC? --     Constitutional: Alert and oriented. Ill appearing but in no acute distress. Eyes: Conjunctivae are normal.  Head: Atraumatic. Nose: No congestion/rhinnorhea. Mouth/Throat: Mucous membranes are moist.  Oropharynx non-erythematous. Neck: No stridor.  Cardiovascular: Normal rate, regular rhythm. Grossly normal heart sounds.  Good peripheral circulation. Respiratory: Normal respiratory effort.  No retractions. Lungs CTAB. Gastrointestinal: Soft and mildly diffusely tender no distention. No abdominal bruits. No CVA tenderness. Musculoskeletal: No lower extremity tenderness nor edema.  No joint effusions. Neurologic:  Normal speech and language. No gross focal neurologic deficits are appreciated. No gait instability.  Skin:  Skin is warm, dry and intact. No rash noted. Psychiatric: Mood and affect are normal. Speech and behavior are normal.  ____________________________________________   LABS (all labs ordered are listed, but only abnormal results are displayed)  Labs Reviewed  BASIC METABOLIC PANEL - Abnormal; Notable for the following components:      Result Value   Potassium 3.1 (*)    Glucose, Bld 126 (*)    Calcium 8.8 (*)    All other components within normal limits  URINALYSIS, COMPLETE (UACMP) WITH MICROSCOPIC - Abnormal; Notable for the following components:   Color, Urine STRAW (*)    APPearance CLEAR (*)    Hgb urine dipstick MODERATE (*)    Leukocytes, UA TRACE (*)    Squamous Epithelial / LPF 0-5 (*)    All other components within normal limits  HEPATIC FUNCTION PANEL - Abnormal; Notable for the following components:   ALT 10 (*)    All other components within normal limits  GASTROINTESTINAL PANEL BY PCR, STOOL (REPLACES STOOL CULTURE)  C DIFFICILE QUICK SCREEN W PCR REFLEX  CBC  INFLUENZA PANEL BY PCR (TYPE A & B)  DIFFERENTIAL  HEPATIC FUNCTION PANEL    ____________________________________________  EKG   ____________________________________________  RADIOLOGY  ED MD interpretation:   Official radiology report(s): Dg Chest 2 View  Result Date: 08/29/2017 CLINICAL DATA:  Weakness and dehydration EXAM: CHEST - 2 VIEW COMPARISON:  October 19, 2016 FINDINGS: There is no edema or consolidation. Heart size and pulmonary vascularity are normal. No adenopathy. No pneumothorax. No bone lesions. IMPRESSION: No edema or consolidation. Electronically Signed   By: Bretta BangWilliam  Woodruff III M.D.   On: 08/29/2017 19:39   Ct Head Wo Contrast  Result Date: 08/29/2017 CLINICAL DATA:  Severe headache for 4-5 days. EXAM: CT HEAD WITHOUT CONTRAST TECHNIQUE: Contiguous axial images were obtained from the base of the skull through the vertex without intravenous contrast. COMPARISON:  Head CT 05/29/2017 FINDINGS: Brain: No mass lesion, intraparenchymal hemorrhage or extra-axial collection. No evidence of acute cortical infarct. Normal appearance of the brain parenchyma and extra axial spaces for age. Vascular: No hyperdense vessel or unexpected vascular calcification. Skull: Normal visualized skull base, calvarium and extracranial soft tissues. Sinuses/Orbits: No sinus fluid levels or advanced mucosal thickening. No mastoid effusion. Normal orbits. IMPRESSION: Normal head CT. Electronically Signed   By: Deatra RobinsonKevin  Herman M.D.   On: 08/29/2017 19:00    ____________________________________________   PROCEDURES  Procedure(s) performed:   Procedures  Critical Care performed:   ____________________________________________   INITIAL IMPRESSION / ASSESSMENT AND PLAN / ED COURSE ----------------------------------------- 6:33 PM on 08/29/2017 -----------------------------------------  Patient now complaining of a bad headache still aching all over headache was not the worst thing going on before.  Flu test is negative most of her labs are back and are normal since  the headache is now so bad I will CT her head and if that is normal probably discharged with diagnosis of viral syndrome.  ----------------------------------------- 10:23 PM on 08/29/2017 -----------------------------------------  Patient has had 3 L of fluid and at least 2 doses of antiemetics.  She is unable to keep down her antibiotics still feels awful and looks sick.  I will put her in the hospital under observation and give her more antiemetics and IV fluids she barely produced any urine so far.         ____________________________________________   FINAL CLINICAL IMPRESSION(S) / ED DIAGNOSES  Final diagnoses:  Dehydration  Intractable vomiting with nausea, unspecified vomiting  type  Urinary tract infection without hematuria, site unspecified     ED Discharge Orders    None       Note:  This document was prepared using Dragon voice recognition software and may include unintentional dictation errors.    Arnaldo Natal, MD 08/29/17 2224    Arnaldo Natal, MD 08/29/17 2225

## 2017-08-29 NOTE — ED Notes (Signed)
Patient transported to [id number] 

## 2017-08-29 NOTE — ED Notes (Signed)
Pt drinking at this time. Pt reports nausea has decreased but continues to have mild nausea. Cipro will be held until pt is able to keep fluids down.

## 2017-08-29 NOTE — ED Notes (Signed)
Pt reports she can not have Zofran due to allergy.

## 2017-08-29 NOTE — H&P (Signed)
[facility] - [place] [facility]   PATIENT NAME: Diane LeiterValarie Warner    MR#:  [id number]  DATE OF BIRTH:  January 20, 1966  DATE OF ADMISSION:  08/29/2017  PRIMARY CARE PHYSICIAN: [facility]   REQUESTING/REFERRING PHYSICIAN:  Arnaldo NatalMalinda, Paul F, MD   CHIEF COMPLAINT:   Chief Complaint  Patient presents with  . Emesis  . Generalized Body Aches  . Headache  . Cough  . Weakness    HISTORY OF PRESENT ILLNESS:  Diane Warner  is a 52 y.o. female with a known history of obesity, HTN, GERD, asthma, IBS, DJD/OA, chronic pain, anx who p/w generalized illness and intractable nausea/vomiting. Pt states that she became acutely ill on Friday 08/25/2017 @~0300AM. She states having N/V xTNTC, initially food products followed by dry-heaving, diarrhea xTNTC, liquid brown. She also states having fever, chills, diffuse abdominal pain/cramping, body aches/myalgias, chest pain, cough, diaphoresis, headache, generalized weakness, and lightheadedness when getting out of bed. She states she was febrile to 102.1 F overnight (Tuesday 08/29/2017 - Wednesday 08/30/2017). She endorses sick contacts at home and at work with similar symptoms. She states she has not been to work since Thursday 08/24/2017. She states she has not eaten or drank properly since Thursday 08/24/2017. She denies SOB, sputum production, hemoptysis, rigors, night sweats, vertigo, blurred vision, neurological symptoms, or urinary symptoms. She denies recent inpatient hospitalization or outpt ABx. She is ill, but does not appear septic/toxic.  PAST MEDICAL HISTORY:   Past Medical History:  Diagnosis Date  . Anxiety   . Arthritis   . Asthma   . Chronic right hip pain   . Degenerative disc disease, thoracic   . GERD (gastroesophageal reflux disease)   . Hypertension   . Irritable bowel syndrome (IBS)   . Scoliosis     PAST SURGICAL HISTORY:   Past Surgical History:  Procedure Laterality Date  .  ABDOMINAL HYSTERECTOMY    . CESAREAN SECTION  1985  . CHOLECYSTECTOMY    . TUBAL LIGATION      SOCIAL HISTORY:   Social History   Tobacco Use  . Smoking status: Former Smoker    Last attempt to quit: 02/16/2014    Years since quitting: 3.5  . Smokeless tobacco: Never Used  Substance Use Topics  . Alcohol use: No    FAMILY HISTORY:  History reviewed. No pertinent family history.  DRUG ALLERGIES:   Allergies  Allergen Reactions  . Amoxicillin Nausea Only  . Aspirin Other (See Comments)    Gi upset   . Compazine Hives  . Dilaudid [Hydromorphone Hcl] Itching  . Penicillins Nausea Only  . Proton Pump Inhibitors Hives  . Zofran [Ondansetron Hcl] Diarrhea    REVIEW OF SYSTEMS:   Review of Systems  Constitutional: Positive for chills, fever and malaise/fatigue. Negative for diaphoresis and weight loss.  HENT: Negative for congestion, hearing loss, sore throat and tinnitus.   Eyes: Negative for blurred vision, double vision and photophobia.  Respiratory: Positive for cough. Negative for hemoptysis, sputum production, shortness of breath and wheezing.   Cardiovascular: Positive for chest pain (+) MSK CP. Negative for palpitations, orthopnea, claudication, leg swelling and PND.  Gastrointestinal: Positive for abdominal pain, diarrhea, nausea and vomiting. Negative for blood in stool, constipation, heartburn and melena.  Genitourinary: Negative for dysuria, frequency, hematuria and urgency.  Musculoskeletal: Positive for back pain and myalgias. Negative for falls, joint pain and neck pain.  Skin: Negative for itching and rash.  Neurological: Positive for dizziness (+)  lightheadedness on standing, weakness (+) generalized weakness and headaches. Negative for tingling, tremors, sensory change, speech change, focal weakness, seizures and loss of consciousness.  Psychiatric/Behavioral: The patient is nervous/anxious and has insomnia.     MEDICATIONS AT HOME:   Prior to Admission  medications   Medication Sig Start Date End Date Taking? Authorizing Provider  albuterol (PROVENTIL HFA;VENTOLIN HFA) 108 (90 Base) MCG/ACT inhaler Inhale 2 puffs into the lungs every 6 (six) hours as needed for wheezing or shortness of breath. 10/17/16   Orvil Feil, PA-C  Asenapine Maleate (SAPHRIS SL) Place 1 tablet under the tongue daily.      [provider]  benzonatate (TESSALON PERLES) 100 MG capsule Take 2 capsules (200 mg total) by mouth 3 (three) times daily as needed. 10/19/16 10/19/17  Tommi Rumps, PA-C  citalopram (CELEXA) 20 MG tablet Take 20 mg by mouth daily.      [provider]  diazepam (VALIUM) 2 MG tablet Take 1 tablet (2 mg total) by mouth every 8 (eight) hours as needed for muscle spasms. 01/18/17   Menshew, Charlesetta Ivory, PA-C  diclofenac sodium (VOLTAREN) 1 % GEL Apply 2 g topically 4 (four) times daily. 09/15/16   Enid Derry, PA-C  lidocaine (LIDODERM) 5 % Place 1 patch onto the skin every 12 (twelve) hours. Remove & Discard patch within 12 hours or as directed by MD 09/15/16 09/15/17  Enid Derry, PA-C  LORazepam (ATIVAN) 1 MG tablet Take 1 mg by mouth 2 (two) times daily.    [provider]  meloxicam (MOBIC) 15 MG tablet Take 1 tablet (15 mg total) by mouth daily. 05/29/17   Cuthriell, Delorise Royals, PA-C  methocarbamol (ROBAXIN) 500 MG tablet Take 1 tablet (500 mg total) by mouth 4 (four) times daily. 05/29/17   Cuthriell, Delorise Royals, PA-C  oxyCODONE-acetaminophen (ROXICET) 5-325 MG tablet Take 1 tablet by mouth 2 times daily at 12 noon and 4 pm. 01/18/17   Menshew, Charlesetta Ivory, PA-C  PrednisoLONE 5 MG (48) TBPK Take 5 mg by mouth as directed. 12-day taper as directed 01/18/17   Menshew, Charlesetta Ivory, PA-C  predniSONE (DELTASONE) 10 MG tablet Take 6 tablets  today, on day 2 take 5 tablets, day 3 take 4 tablets, day 4 take 3 tablets, day 5 take  2 tablets and 1 tablet the last day 10/19/16   Tommi Rumps, PA-C  promethazine (PHENERGAN) 12.5  MG tablet Take 1 tablet (12.5 mg total) by mouth every 6 (six) hours as needed for nausea or vomiting. 08/25/17   Jene Every, MD  propranolol (INDERAL) 40 MG tablet Take 1 tablet (40 mg total) by mouth 2 (two) times daily. 09/20/15   Jennye Moccasin, MD  ranitidine (ZANTAC) 150 MG tablet Take 150 mg by mouth 2 (two) times daily.      [provider]      VITAL SIGNS:  Blood pressure 137/87, pulse 75, temperature 98.3 F (36.8 C), temperature source Oral, resp. rate 16, height 5\' 4"  (1.626 m), weight 99.8 kg (220 lb), SpO2 97 %.  PHYSICAL EXAMINATION:  Physical Exam  Constitutional: She is oriented to person, place, and time. She appears well-developed and well-nourished. She is cooperative. She is easily aroused.  Non-toxic appearance. She appears ill. No distress.  HENT:  Head: Normocephalic and atraumatic.  Eyes: Pupils are equal, round, and reactive to light. Conjunctivae and EOM are normal. No scleral icterus. Right pupil is round and reactive. Left pupil is round  and reactive. Pupils are equal.  Neck: Neck supple. No JVD present. No thyromegaly present.  Cardiovascular: Normal rate, regular rhythm, S1 normal, S2 normal and normal heart sounds. Exam reveals no gallop and no friction rub.  No murmur heard. Pulmonary/Chest: Effort normal and breath sounds normal. No stridor. No respiratory distress. She has no wheezes. She has no rhonchi. She has no rales. She exhibits tenderness.  Abdominal: Soft. Bowel sounds are normal. She exhibits no distension and no ascites. There is generalized tenderness (+) mild diffuse + suprapubic TTP and tenderness in the suprapubic area. There is no guarding.  Musculoskeletal: She exhibits no edema.  Lymphadenopathy:    She has no cervical adenopathy.  Neurological: She is alert, oriented to person, place, and time and easily aroused. She is not disoriented.  Skin: Skin is warm, dry and intact. No rash noted. She is not diaphoretic. No erythema.    Psychiatric: Her behavior is normal. Judgment and thought content normal. Her mood appears anxious. Her affect is not angry, not blunt, not labile and not inappropriate. Her speech is not rapid and/or pressured, not delayed, not tangential and not slurred. She is not agitated, not aggressive, not hyperactive, not slowed, not withdrawn, not actively hallucinating and not combative. Thought content is not paranoid and not delusional. Cognition and memory are normal. Cognition and memory are not impaired. She does not express impulsivity or inappropriate judgment. She is communicative. She exhibits normal recent memory and normal remote memory. She is attentive.     LABORATORY PANEL:   CBC Recent Labs  Lab 08/29/17 1056  WBC 7.9  HGB 15.4  HCT 46.1  PLT 258   ------------------------------------------------------------------------------------------------------------------  Chemistries  Recent Labs  Lab 08/29/17 1056 08/29/17 1516  NA 137  --   K 3.1*  --   CL 103  --   CO2 26  --   GLUCOSE 126*  --   BUN 12  --   CREATININE 0.89  --   CALCIUM 8.8*  --   AST  --  20  ALT  --  10*  ALKPHOS  --  74  BILITOT  --  0.8   ------------------------------------------------------------------------------------------------------------------  Cardiac Enzymes No results for input(s): TROPONINI in the last 168 hours. ------------------------------------------------------------------------------------------------------------------  RADIOLOGY:  Dg Chest 2 View  Result Date: 08/29/2017 CLINICAL DATA:  Weakness and dehydration EXAM: CHEST - 2 VIEW COMPARISON:  October 19, 2016 FINDINGS: There is no edema or consolidation. Heart size and pulmonary vascularity are normal. No adenopathy. No pneumothorax. No bone lesions. IMPRESSION: No edema or consolidation. Electronically Signed   By: Bretta Bang III M.D.   On: 08/29/2017 19:39   Ct Head Wo Contrast  Result Date: 08/29/2017 CLINICAL DATA:   Severe headache for 4-5 days. EXAM: CT HEAD WITHOUT CONTRAST TECHNIQUE: Contiguous axial images were obtained from the base of the skull through the vertex without intravenous contrast. COMPARISON:  Head CT 05/29/2017 FINDINGS: Brain: No mass lesion, intraparenchymal hemorrhage or extra-axial collection. No evidence of acute cortical infarct. Normal appearance of the brain parenchyma and extra axial spaces for age. Vascular: No hyperdense vessel or unexpected vascular calcification. Skull: Normal visualized skull base, calvarium and extracranial soft tissues. Sinuses/Orbits: No sinus fluid levels or advanced mucosal thickening. No mastoid effusion. Normal orbits. IMPRESSION: Normal head CT. Electronically Signed   By: Deatra Robinson M.D.   On: 08/29/2017 19:00   IMPRESSION AND PLAN:   A/P: 31F intractable N/V, UTI, hypokalemia.  1.) Intractable N/V: Possibly 2/2 viral infxn  vs. UTI. Influenza (-). Symptomatic mgmt. Pt will only take Phenergan. IVF. Diet as tolerated.  2.) UTI: U/A (+) WBC, trace leukocytes. Afebrile in ED, (-) leukocytosis/tachycardia/tachypnea/hypoxia, SIRS (-). UCx pending. Ceftriaxone 1g IV qD.  3.) Hypokalemia: K+ 3.1. Mag level pending. IVR LR + KCl, replete, monitor BMP.  4.) Hyperglycemia: Glucose 126. No Dx DM. HbA1c pending.  5.) HTN: c/w Inderal.  6.) GERD: Famotidine as formulary substitution for home Ranitidine.  7.) Asthma: PRN nebs.  8.) DJD/OA/Chronic pain: Percocet PRN, c/w Mobic, Valium, Robaxin.  9.) Anx: c/w Ativan, Valium.  10.) FEN/GI: Cardiac diet, IVF LR + KCl, Famotidine.  11.) DVT PPx: Lovenox 40mg  SQ qD.  12.) Code status: Full code.  13.) Disposition: Observation, pt expected to stay < 2 midnights.  All the records are reviewed and case discussed with ED provider. Management plans discussed with the patient, family and they are in agreement.  CODE STATUS: Full code.  TOTAL TIME TAKING CARE OF THIS PATIENT: 75 minutes.    Barbaraann Rondo M.D on 08/29/2017 at 11:34 PM  Between 7am to 6pm - Pager - [phone]  After 6pm go to [email or web address] - Scientist, research (life sciences) [facility]  Office  [phone]  CC: Primary care physician; [facility]   Note: This dictation was prepared with Dragon dictation along with smaller phrase technology. Any transcriptional errors that result from this process are unintentional.

## 2017-08-29 NOTE — ED Notes (Signed)
Report received on this pt.  Pt was not able to tolerate anything by mouth.  Will be admitted for intractible nausea and vomiting.

## 2017-08-30 LAB — GASTROINTESTINAL PANEL BY PCR, STOOL (REPLACES STOOL CULTURE)
ADENOVIRUS F40/41: NOT DETECTED
ASTROVIRUS: NOT DETECTED
CRYPTOSPORIDIUM: NOT DETECTED
Campylobacter species: NOT DETECTED
Cyclospora cayetanensis: NOT DETECTED
ENTEROAGGREGATIVE E COLI (EAEC): NOT DETECTED
Entamoeba histolytica: NOT DETECTED
Enteropathogenic E coli (EPEC): NOT DETECTED
Enterotoxigenic E coli (ETEC): NOT DETECTED
GIARDIA LAMBLIA: NOT DETECTED
Norovirus GI/GII: NOT DETECTED
Plesimonas shigelloides: NOT DETECTED
ROTAVIRUS A: NOT DETECTED
Salmonella species: NOT DETECTED
Sapovirus (I, II, IV, and V): NOT DETECTED
Shiga like toxin producing E coli (STEC): NOT DETECTED
Shigella/Enteroinvasive E coli (EIEC): NOT DETECTED
VIBRIO SPECIES: NOT DETECTED
Vibrio cholerae: NOT DETECTED
YERSINIA ENTEROCOLITICA: NOT DETECTED

## 2017-08-30 LAB — URINE DRUG SCREEN, QUALITATIVE (ARMC ONLY)
AMPHETAMINES, UR SCREEN: NOT DETECTED
Barbiturates, Ur Screen: NOT DETECTED
Benzodiazepine, Ur Scrn: NOT DETECTED
COCAINE METABOLITE, UR ~~LOC~~: POSITIVE — AB
Cannabinoid 50 Ng, Ur ~~LOC~~: NOT DETECTED
MDMA (ECSTASY) UR SCREEN: NOT DETECTED
Methadone Scn, Ur: NOT DETECTED
OPIATE, UR SCREEN: POSITIVE — AB
PHENCYCLIDINE (PCP) UR S: NOT DETECTED
Tricyclic, Ur Screen: NOT DETECTED

## 2017-08-30 LAB — BASIC METABOLIC PANEL
ANION GAP: 2 — AB (ref 5–15)
BUN: 11 mg/dL (ref 6–20)
CHLORIDE: 109 mmol/L (ref 101–111)
CO2: 28 mmol/L (ref 22–32)
Calcium: 7.8 mg/dL — ABNORMAL LOW (ref 8.9–10.3)
Creatinine, Ser: 0.76 mg/dL (ref 0.44–1.00)
GFR calc Af Amer: >60 mL/min (ref >60)
GFR calc non Af Amer: >60 mL/min (ref >60)
GLUCOSE: 92 mg/dL (ref 65–99)
POTASSIUM: 3.6 mmol/L (ref 3.5–5.1)
Sodium: 139 mmol/L (ref 135–145)

## 2017-08-30 LAB — MAGNESIUM: Magnesium: 1.8 mg/dL (ref 1.7–2.4)

## 2017-08-30 LAB — C DIFFICILE QUICK SCREEN W PCR REFLEX
C Diff antigen: NEGATIVE
C Diff interpretation: NOT DETECTED
C Diff toxin: NEGATIVE

## 2017-08-30 LAB — HEMOGLOBIN A1C
Hgb A1c MFr Bld: 5.2 % (ref 4.8–5.6)
Mean Plasma Glucose: 102.54 mg/dL

## 2017-08-30 MED ORDER — POTASSIUM CHLORIDE 2 MEQ/ML IV SOLN
INTRAVENOUS | Status: DC
  Administered 2017-08-30 – 2017-09-01 (×3): via INTRAVENOUS
  Filled 2017-08-30 (×5): qty 1000

## 2017-08-30 MED ORDER — ENSURE ENLIVE PO LIQD
237.0000 mL | Freq: Two times a day (BID) | ORAL | Status: DC
  Administered 2017-08-30 – 2017-09-02 (×5): 237 mL via ORAL

## 2017-08-30 NOTE — Progress Notes (Signed)
Initial Nutrition Assessment  DOCUMENTATION CODES:   Obesity unspecified  INTERVENTION:   Ensure Enlive po BID, each supplement provides 350 kcal and 20 grams of protein  Moderate refeed risk; recommend monitor K, Mg, and P once oral intake improves.   NUTRITION DIAGNOSIS:   Inadequate oral intake related to acute illness as evidenced by per patient/family report  GOAL:   Patient will meet greater than or equal to 90% of their needs  MONITOR:   PO intake, Supplement acceptance, Weight trends, Labs, I & O's  REASON FOR ASSESSMENT:   Malnutrition Screening Tool    ASSESSMENT:   52 y.o. female with a known history of obesity, HTN, GERD, asthma, IBS, DJD/OA, chronic pain, anx who p/w generalized illness and intractable nausea/vomiting.   Pt with poor appetite and oral intake pta r/t nausea and vomiting since 4/12. Pt also noted to have diarrhea. Per MD note, possibly viral etiology. Per chart, pt with wt gain pta. Pt with noted history of IBS. RD will order supplements to help pt meet her estimated needs. Pt currently on full liquid diet. Pt likely at moderate refeeding risk; recommend monitor K, Mg, and P labs once oral intake improved.   Medications reviewed and include: lovenox, pepcid, meloxicam, ceftriaxone, LRS 9250ml/hr  Labs reviewed: Ca 7.8(L) Mg 1.8 wnl- 4/16  NUTRITION - FOCUSED PHYSICAL EXAM:    Most Recent Value  Orbital Region  No depletion  Upper Arm Region  No depletion  Thoracic and Lumbar Region  No depletion  Buccal Region  No depletion  Temple Region  No depletion  Clavicle Bone Region  No depletion  Clavicle and Acromion Bone Region  No depletion  Scapular Bone Region  No depletion  Dorsal Hand  No depletion  Patellar Region  No depletion  Anterior Thigh Region  No depletion  Posterior Calf Region  No depletion  Edema (RD Assessment)  None  Hair  Reviewed  Eyes  Reviewed  Mouth  Reviewed  Skin  Reviewed  Nails  Reviewed     Diet Order:   Diet full liquid Room service appropriate? Yes; Fluid consistency: Thin  EDUCATION NEEDS:   Education needs have been addressed  Skin:  Skin Assessment: Reviewed RN Assessment  Last BM:  4/16  Height:   Ht Readings from Last 1 Encounters:  08/29/17 5\' 4"  (1.626 m)    Weight:   Wt Readings from Last 1 Encounters:  08/29/17 231 lb 3.2 oz (104.9 kg)    Ideal Body Weight:  54.5 kg  BMI:  Body mass index is 39.69 kg/m.  Estimated Nutritional Needs:   Kcal:  1800-2100kcal/day  Protein:  84-105g/day   Fluid:  >1.6L/day   Diane Holidayasey Avionna Bower MS, RD, LDN Pager #[phone] After Hours Pager: [phone]

## 2017-08-30 NOTE — Progress Notes (Signed)
Spoke with a lab tech who stated it was OK to use an Optio label for this patient's stool sample that will be ran for both a GI panel and C-Diff. Wrote collection time on this label. Will send momentarily. For some reason patient's lab-related labels will not print from [employer] [facility]. Will continue to monitor for result(s). Jari FavreSteven M [facility]

## 2017-08-30 NOTE — Progress Notes (Signed)
Patient ID: Diane Warner, female   DOB: 1965/10/04, 52 y.o.   MRN: [id number]  [employer] [facility] PROGRESS NOTE  Diane Warner VOZ:[id number] DOB: 1965/08/12 DOA: 08/29/2017 PCP: [facility]  HPI/Subjective: Patient's with nausea and vomiting and diarrhea.  Having abdominal discomfort.  She states she had a sick contact at work a couple weeks ago.  She is not been feeling well since Friday.  Unable to keep anything down.  Has not had any diarrhea since coming to the hospital.  Vomited this morning.  No recent antibiotics.  No travel out of country.  Objective: Vitals:   08/30/17 0423 08/30/17 0747  BP: 122/79 120/75  Pulse: (!) 59 65  Resp: 18 18  Temp: 97.6 F (36.4 C) 98.2 F (36.8 C)  SpO2: 97% 95%    Filed Weights   08/29/17 1034 08/29/17 2356  Weight: 99.8 kg (220 lb) 104.9 kg (231 lb 3.2 oz)    ROS: Review of Systems  Constitutional: Negative for chills and fever.  Eyes: Negative for blurred vision.  Respiratory: Negative for cough and shortness of breath.   Cardiovascular: Negative for chest pain.  Gastrointestinal: Positive for abdominal pain, diarrhea, nausea and vomiting. Negative for constipation.  Genitourinary: Negative for dysuria.  Musculoskeletal: Negative for joint pain.  Neurological: Negative for dizziness and headaches.   Exam: Physical Exam  Constitutional: She is oriented to person, place, and time.  HENT:  Nose: No mucosal edema.  Mouth/Throat: No oropharyngeal exudate or posterior oropharyngeal edema.  Eyes: Pupils are equal, round, and reactive to light. Conjunctivae, EOM and lids are normal.  Neck: No JVD present. Carotid bruit is not present. No edema present. No thyroid mass and no thyromegaly present.  Cardiovascular: S1 normal and S2 normal. Exam reveals no gallop.  No murmur heard. Pulses:      Dorsalis pedis pulses are 2+ on the right side, and 2+ on the left side.  Respiratory: No respiratory distress. She  has decreased breath sounds in the right lower field and the left lower field. She has no wheezes. She has no rhonchi. She has no rales.  GI: Soft. Bowel sounds are normal. There is tenderness.  Musculoskeletal:       Right ankle: She exhibits no swelling.       Left ankle: She exhibits no swelling.  Lymphadenopathy:    She has no cervical adenopathy.  Neurological: She is alert and oriented to person, place, and time. No cranial nerve deficit.  Skin: Skin is warm. No rash noted. Nails show no clubbing.  Psychiatric: She has a normal mood and affect.      Data Reviewed: Basic Metabolic Panel: Recent Labs  Lab 08/25/17 1453 08/29/17 1056 08/29/17 2307 08/30/17 0528  NA 136 137  --  139  K 3.3* 3.1*  --  3.6  CL 104 103  --  109  CO2 26 26  --  28  GLUCOSE 122* 126*  --  92  BUN 11 12  --  11  CREATININE 0.88 0.89  --  0.76  CALCIUM 8.5* 8.8*  --  7.8*  MG  --   --  1.8  --    Liver Function Tests: Recent Labs  Lab 08/25/17 1453 08/29/17 1516  AST 27 20  ALT 12* 10*  ALKPHOS 75 74  BILITOT 0.7 0.8  PROT 7.8 7.9  ALBUMIN 4.2 4.1   Recent Labs  Lab 08/25/17 1453  LIPASE 29   CBC: Recent Labs  Lab  08/25/17 1453 08/29/17 1056  WBC 8.4 7.9  NEUTROABS  --  5.3  HGB 15.2 15.4  HCT 45.4 46.1  MCV 91.5 91.1  PLT 283 258     Studies: Dg Chest 2 View  Result Date: 08/29/2017 CLINICAL DATA:  Weakness and dehydration EXAM: CHEST - 2 VIEW COMPARISON:  October 19, 2016 FINDINGS: There is no edema or consolidation. Heart size and pulmonary vascularity are normal. No adenopathy. No pneumothorax. No bone lesions. IMPRESSION: No edema or consolidation. Electronically Signed   By: Bretta BangWilliam  Woodruff III M.D.   On: 08/29/2017 19:39   Ct Head Wo Contrast  Result Date: 08/29/2017 CLINICAL DATA:  Severe headache for 4-5 days. EXAM: CT HEAD WITHOUT CONTRAST TECHNIQUE: Contiguous axial images were obtained from the base of the skull through the vertex without intravenous  contrast. COMPARISON:  Head CT 05/29/2017 FINDINGS: Brain: No mass lesion, intraparenchymal hemorrhage or extra-axial collection. No evidence of acute cortical infarct. Normal appearance of the brain parenchyma and extra axial spaces for age. Vascular: No hyperdense vessel or unexpected vascular calcification. Skull: Normal visualized skull base, calvarium and extracranial soft tissues. Sinuses/Orbits: No sinus fluid levels or advanced mucosal thickening. No mastoid effusion. Normal orbits. IMPRESSION: Normal head CT. Electronically Signed   By: Deatra RobinsonKevin  Herman M.D.   On: 08/29/2017 19:00    Scheduled Meds: . enoxaparin (LOVENOX) injection  40 mg Subcutaneous Q24H  . famotidine  20 mg Oral BID  . feeding supplement (ENSURE ENLIVE)  237 mL Oral BID BM  . LORazepam  1 mg Oral BID  . meloxicam  15 mg Oral Daily  . methocarbamol  500 mg Oral QID   Continuous Infusions: . cefTRIAXone (ROCEPHIN)  IV Stopped (08/30/17 1037)  . lactated ringers with kcl 50 mL/hr at 08/30/17 1119    Assessment/Plan:  1. Gastroenteritis with nausea vomiting and diarrhea with abdominal pain.  Conservative management with IV fluids.  Full liquid diet.  PRN nausea medications and pain medications.  If no improvement will consider CT scan. 2. Acute cystitis with hematuria.  Urine culture added.  Empiric Rocephin. 3. GERD on famotidine 4. Cocaine positive and urine toxicology.  Patient denied any drug use.  Stop beta-blocker. 5. Hypokalemia replace potassium and IV fluids.  Code Status:     Code Status Orders  (From admission, onward)        Start     Ordered   08/29/17 2357  Full code  Continuous     08/29/17 2356    Code Status History    This patient has a current code status but no historical code status.     Disposition Plan: Will need to tolerate solid food prior to going home.  Antibiotics:  Rocephin  Time spent: 28 minutes  Sayler Mickiewicz [employer]  [employer]

## 2017-08-30 NOTE — Plan of Care (Signed)
  Problem: Health Behavior/Discharge Planning: Goal: Ability to manage health-related needs will improve Outcome: Not Progressing Note:  Urine toxicology screen ran today was positive for cocaine, among other things. Physician made aware. Informed that conservative medical management will be employed. Will continue to monitor patient for signs / symptoms of withdrawal. Diane FavreSteven M [facility]

## 2017-08-30 NOTE — Care Management (Signed)
Admitted from home with intractable N/V.  Diet is being advanced to full liquids.  Followed by [facility].  [facility]  [facility]. Will anticipate possible need for med assist

## 2017-08-31 ENCOUNTER — Observation Stay: Payer: Self-pay

## 2017-08-31 DIAGNOSIS — K529 Noninfective gastroenteritis and colitis, unspecified: Secondary | ICD-10-CM | POA: Diagnosis present

## 2017-08-31 LAB — URINE CULTURE: CULTURE: NO GROWTH

## 2017-08-31 LAB — HIV ANTIBODY (ROUTINE TESTING W REFLEX): HIV SCREEN 4TH GENERATION: NONREACTIVE

## 2017-08-31 MED ORDER — IOPAMIDOL (ISOVUE-300) INJECTION 61%
15.0000 mL | INTRAVENOUS | Status: AC
  Administered 2017-08-31 (×2): 15 mL via ORAL

## 2017-08-31 MED ORDER — LOPERAMIDE HCL 2 MG PO CAPS
2.0000 mg | ORAL_CAPSULE | Freq: Four times a day (QID) | ORAL | Status: DC | PRN
  Administered 2017-08-31: 2 mg via ORAL
  Filled 2017-08-31: qty 1

## 2017-08-31 MED ORDER — IOPAMIDOL (ISOVUE-300) INJECTION 61%
100.0000 mL | Freq: Once | INTRAVENOUS | Status: AC | PRN
  Administered 2017-08-31: 100 mL via INTRAVENOUS

## 2017-08-31 NOTE — Progress Notes (Signed)
Pt states she had 7 diarrhea today. Notified doctor Weiting and ordered Imodium 2 mg PRN for diarrhea. Will continue to monitor.

## 2017-08-31 NOTE — Progress Notes (Signed)
Patient ID: Diane Warner, female   DOB: [birth year], 52 y.o.   MRN: [id number]  [facility] PROGRESS NOTE  Diane Warner OZH:[id number] DOB: [birth year] DOA: 08/29/2017 PCP: [facility]  HPI/Subjective: Patient still not feeling well.  Started having diarrhea again.  Having lower abdominal pain.  Still with some nausea and vomiting.  Objective: Vitals:   08/31/17 0409 08/31/17 0817  BP: 123/69 128/78  Pulse: (!) 56 65  Resp:  20  Temp: 97.7 F (36.5 C) 98.3 F (36.8 C)  SpO2: 92% 97%    Filed Weights   08/29/17 2356 08/30/17 1518 08/31/17 0409  Weight: 104.9 kg (231 lb 3.2 oz) 106.2 kg (234 lb 3.2 oz) 106.3 kg (234 lb 4.8 oz)    ROS: Review of Systems  Constitutional: Negative for chills and fever.  Eyes: Negative for blurred vision.  Respiratory: Negative for cough and shortness of breath.   Cardiovascular: Negative for chest pain.  Gastrointestinal: Positive for abdominal pain, diarrhea, nausea and vomiting. Negative for constipation.  Genitourinary: Negative for dysuria.  Musculoskeletal: Negative for joint pain.  Neurological: Negative for dizziness and headaches.   Exam: Physical Exam  Constitutional: She is oriented to person, place, and time.  HENT:  Nose: No mucosal edema.  Mouth/Throat: No oropharyngeal exudate or posterior oropharyngeal edema.  Eyes: Pupils are equal, round, and reactive to light. Conjunctivae, EOM and lids are normal.  Neck: No JVD present. Carotid bruit is not present. No edema present. No thyroid mass and no thyromegaly present.  Cardiovascular: S1 normal and S2 normal. Exam reveals no gallop.  No murmur heard. Pulses:      Dorsalis pedis pulses are 2+ on the right side, and 2+ on the left side.  Respiratory: No respiratory distress. She has decreased breath sounds in the right lower field and the left lower field. She has no wheezes. She has no rhonchi. She has no rales.  GI: Soft. Bowel sounds are  normal. There is tenderness.  Musculoskeletal:       Right ankle: She exhibits no swelling.       Left ankle: She exhibits no swelling.  Lymphadenopathy:    She has no cervical adenopathy.  Neurological: She is alert and oriented to person, place, and time. No cranial nerve deficit.  Skin: Skin is warm. No rash noted. Nails show no clubbing.  Psychiatric: She has a normal mood and affect.      Data Reviewed: Basic Metabolic Panel: Recent Labs  Lab 08/25/17 1453 08/29/17 1056 08/29/17 2307 08/30/17 0528  NA 136 137  --  139  K 3.3* 3.1*  --  3.6  CL 104 103  --  109  CO2 26 26  --  28  GLUCOSE 122* 126*  --  92  BUN 11 12  --  11  CREATININE 0.88 0.89  --  0.76  CALCIUM 8.5* 8.8*  --  7.8*  MG  --   --  1.8  --    Liver Function Tests: Recent Labs  Lab 08/25/17 1453 08/29/17 1516  AST 27 20  ALT 12* 10*  ALKPHOS 75 74  BILITOT 0.7 0.8  PROT 7.8 7.9  ALBUMIN 4.2 4.1   Recent Labs  Lab 08/25/17 1453  LIPASE 29   CBC: Recent Labs  Lab 08/25/17 1453 08/29/17 1056  WBC 8.4 7.9  NEUTROABS  --  5.3  HGB 15.2 15.4  HCT 45.4 46.1  MCV 91.5 91.1  PLT 283 258  Studies: Dg Chest 2 View  Result Date: 08/29/2017 CLINICAL DATA:  Weakness and dehydration EXAM: CHEST - 2 VIEW COMPARISON:  October 19, 2016 FINDINGS: There is no edema or consolidation. Heart size and pulmonary vascularity are normal. No adenopathy. No pneumothorax. No bone lesions. IMPRESSION: No edema or consolidation. Electronically Signed   By: Bretta Bang III M.D.   On: 08/29/2017 19:39   Ct Head Wo Contrast  Result Date: 08/29/2017 CLINICAL DATA:  Severe headache for 4-5 days. EXAM: CT HEAD WITHOUT CONTRAST TECHNIQUE: Contiguous axial images were obtained from the base of the skull through the vertex without intravenous contrast. COMPARISON:  Head CT 05/29/2017 FINDINGS: Brain: No mass lesion, intraparenchymal hemorrhage or extra-axial collection. No evidence of acute cortical infarct.  Normal appearance of the brain parenchyma and extra axial spaces for age. Vascular: No hyperdense vessel or unexpected vascular calcification. Skull: Normal visualized skull base, calvarium and extracranial soft tissues. Sinuses/Orbits: No sinus fluid levels or advanced mucosal thickening. No mastoid effusion. Normal orbits. IMPRESSION: Normal head CT. Electronically Signed   By: Deatra Robinson M.D.   On: 08/29/2017 19:00    Scheduled Meds: . enoxaparin (LOVENOX) injection  40 mg Subcutaneous Q24H  . famotidine  20 mg Oral BID  . feeding supplement (ENSURE ENLIVE)  237 mL Oral BID BM  . LORazepam  1 mg Oral BID  . meloxicam  15 mg Oral Daily  . methocarbamol  500 mg Oral QID   Continuous Infusions: . cefTRIAXone (ROCEPHIN)  IV Stopped (08/31/17 1044)  . lactated ringers with kcl 50 mL/hr at 08/31/17 1045    Assessment/Plan:  1. Gastroenteritis with nausea vomiting and diarrhea with abdominal pain.  Conservative management with IV fluids.  PRN nausea medications and pain medications.   Since the patient does not doing much better I ordered a CT scan of the abdomen and pelvis. 2. Acute cystitis with hematuria.  Urine culture added.  Empiric Rocephin. 3. GERD on famotidine 4. Cocaine positive and urine toxicology.  Patient  admitted to snorting cocaine last week.  Stop beta-blocker. 5. Hypokalemia replace potassium and IV fluids.  Code Status:     Code Status Orders  (From admission, onward)        Start     Ordered   08/29/17 2357  Full code  Continuous     08/29/17 2356    Code Status History    This patient has a current code status but no historical code status.     Disposition Plan: Will need to tolerate solid food prior to going home.  Antibiotics:  Rocephin  Time spent: 25 minutes  Edia Pursifull [employer]

## 2017-08-31 NOTE — Plan of Care (Signed)
  Problem: Pain Managment: Goal: General experience of comfort will improve Outcome: Not Progressing  Pt continues to complain of headache 8/10

## 2017-09-01 LAB — BASIC METABOLIC PANEL
Anion gap: 5 (ref 5–15)
BUN: 8 mg/dL (ref 6–20)
CHLORIDE: 106 mmol/L (ref 101–111)
CO2: 29 mmol/L (ref 22–32)
Calcium: 8.7 mg/dL — ABNORMAL LOW (ref 8.9–10.3)
Creatinine, Ser: 0.72 mg/dL (ref 0.44–1.00)
GFR calc non Af Amer: >60 mL/min (ref >60)
GLUCOSE: 120 mg/dL — AB (ref 65–99)
Potassium: 3.7 mmol/L (ref 3.5–5.1)
Sodium: 140 mmol/L (ref 135–145)

## 2017-09-01 MED ORDER — LOPERAMIDE HCL 2 MG PO CAPS
4.0000 mg | ORAL_CAPSULE | Freq: Four times a day (QID) | ORAL | Status: DC
  Administered 2017-09-01 – 2017-09-02 (×3): 4 mg via ORAL
  Filled 2017-09-01 (×3): qty 2

## 2017-09-01 MED ORDER — SUMATRIPTAN SUCCINATE 6 MG/0.5ML ~~LOC~~ SOLN
6.0000 mg | Freq: Once | SUBCUTANEOUS | Status: AC
  Administered 2017-09-01: 6 mg via SUBCUTANEOUS
  Filled 2017-09-01: qty 0.5

## 2017-09-01 MED ORDER — MORPHINE SULFATE (PF) 2 MG/ML IV SOLN
2.0000 mg | INTRAVENOUS | Status: DC | PRN
  Administered 2017-09-01 – 2017-09-02 (×3): 2 mg via INTRAVENOUS
  Filled 2017-09-01 (×3): qty 1

## 2017-09-01 NOTE — Progress Notes (Signed)
Patient ID: Diane Warner, female   DOB: December 23, 1965, 52 y.o.   MRN: [id number]  [facility] PROGRESS NOTE  Diane Warner [id number] DOB: [birth year] DOA: 08/29/2017 PCP: [facility]  HPI/Subjective: Patient continues to complaint of nausea and diarrhea , currently has a headache Objective: Vitals:   09/01/17 0458 09/01/17 0812  BP: (!) 155/92 130/75  Pulse: 73 66  Resp: 18 18  Temp: 97.7 F (36.5 C) (!) 97.5 F (36.4 C)  SpO2: 98% 99%    Filed Weights   08/30/17 1518 08/31/17 0409 09/01/17 0458  Weight: 106.2 kg (234 lb 3.2 oz) 106.3 kg (234 lb 4.8 oz) 105.2 kg (232 lb)    ROS: Review of Systems  Constitutional: Negative for chills and fever.  Eyes: Negative for blurred vision.  Respiratory: Negative for cough and shortness of breath.   Cardiovascular: Negative for chest pain.  Gastrointestinal: Positive for abdominal pain, diarrhea, nausea and vomiting. Negative for constipation.  Genitourinary: Negative for dysuria.  Musculoskeletal: Negative for joint pain.  Neurological: Positive for headaches. Negative for dizziness.   Exam: Physical Exam  Constitutional: She is oriented to person, place, and time.  HENT:  Nose: No mucosal edema.  Mouth/Throat: No oropharyngeal exudate or posterior oropharyngeal edema.  Eyes: Pupils are equal, round, and reactive to light. Conjunctivae, EOM and lids are normal.  Neck: No JVD present. Carotid bruit is not present. No edema present. No thyroid mass and no thyromegaly present.  Cardiovascular: S1 normal and S2 normal. Exam reveals no gallop.  No murmur heard. Pulses:      Dorsalis pedis pulses are 2+ on the right side, and 2+ on the left side.  Respiratory: No respiratory distress. She has decreased breath sounds in the right lower field and the left lower field. She has no wheezes. She has no rhonchi. She has no rales.  GI: Soft. Bowel sounds are normal. There is tenderness.  Musculoskeletal:        Right ankle: She exhibits no swelling.       Left ankle: She exhibits no swelling.  Lymphadenopathy:    She has no cervical adenopathy.  Neurological: She is alert and oriented to person, place, and time. No cranial nerve deficit.  Skin: Skin is warm. No rash noted. Nails show no clubbing.  Psychiatric: She has a normal mood and affect.      Data Reviewed: Basic Metabolic Panel: Recent Labs  Lab 08/29/17 1056 08/29/17 2307 08/30/17 0528 09/01/17 0410  NA 137  --  139 140  K 3.1*  --  3.6 3.7  CL 103  --  109 106  CO2 26  --  28 29  GLUCOSE 126*  --  92 120*  BUN 12  --  11 8  CREATININE 0.89  --  0.76 0.72  CALCIUM 8.8*  --  7.8* 8.7*  MG  --  1.8  --   --    Liver Function Tests: Recent Labs  Lab 08/29/17 1516  AST 20  ALT 10*  ALKPHOS 74  BILITOT 0.8  PROT 7.9  ALBUMIN 4.1   No results for input(s): LIPASE, AMYLASE in the last 168 hours. CBC: Recent Labs  Lab 08/29/17 1056  WBC 7.9  NEUTROABS 5.3  HGB 15.4  HCT 46.1  MCV 91.1  PLT 258     Studies: Ct Abdomen Pelvis W Contrast  Result Date: 08/31/2017 CLINICAL DATA:  Intractable nausea and vomiting EXAM: CT ABDOMEN AND PELVIS WITH CONTRAST TECHNIQUE: Multidetector CT  imaging of the abdomen and pelvis was performed using the standard protocol following bolus administration of intravenous contrast. CONTRAST:  100mL ISOVUE-300 IOPAMIDOL (ISOVUE-300) INJECTION 61% COMPARISON:  None. FINDINGS: Lower chest: Lung bases are free of acute infiltrate or sizable effusion. Hepatobiliary: No focal liver abnormality is seen. Status post cholecystectomy. No biliary dilatation. Pancreas: Unremarkable. No pancreatic ductal dilatation or surrounding inflammatory changes. Spleen: Normal in size without focal abnormality. Adrenals/Urinary Tract: Adrenal glands are within normal limits. Kidneys demonstrate a few scattered small hypodensities consistent with small cysts. No calculi or obstructive changes are seen. Bladder is  partially distended. Stomach/Bowel: Stomach shows a small sliding-type hiatal hernia. Appendix appears normal. No evidence of bowel wall thickening, distention, or inflammatory changes. Vascular/Lymphatic: No acute vascular abnormality is noted. No significant lymphadenopathy is identified. Reproductive: Status post hysterectomy. No adnexal masses. Other: No abdominal wall hernia or abnormality. No abdominopelvic ascites. Musculoskeletal: No acute or significant osseous findings. IMPRESSION: No acute abnormality noted. Electronically Signed   By: Alcide CleverMark  Lukens M.D.   On: 08/31/2017 12:27    Scheduled Meds: . enoxaparin (LOVENOX) injection  40 mg Subcutaneous Q24H  . famotidine  20 mg Oral BID  . feeding supplement (ENSURE ENLIVE)  237 mL Oral BID BM  . loperamide  4 mg Oral Q6H  . LORazepam  1 mg Oral BID  . meloxicam  15 mg Oral Daily  . methocarbamol  500 mg Oral QID   Continuous Infusions: . lactated ringers with kcl 50 mL/hr at 09/01/17 0444    Assessment/Plan:  1. Gastroenteritis with nausea vomiting and diarrhea with abdominal pain.  CT of the abdomen shows no significant abnormality due to persistent diarrhea will place on scheduled Imodium,  2. Acute cystitis with hematuria.  Urine culture added.  Status post treatment 3. Migraine headache Imitrex 4. Cocaine positive and urine toxicology.  Patient  admitted to snorting cocaine last week.  Stop beta-blocker. 5. Hypokalemia replace potassium and IV fluids.  Code Status:     Code Status Orders  (From admission, onward)        Start     Ordered   08/29/17 2357  Full code  Continuous     08/29/17 2356    Code Status History    This patient has a current code status but no historical code status.     Disposition Plan: Will need to tolerate solid food prior to going home.  Antibiotics:  Rocephin  Time spent: 25 minutes  Clester Chlebowski [employer]  [employer]

## 2017-09-01 NOTE — Plan of Care (Signed)
Pt continues to complain of 8/10 headache.

## 2017-09-01 NOTE — Plan of Care (Signed)
  Problem: Clinical Measurements: Goal: Will remain free from infection Outcome: Progressing   Problem: Pain Managment: Goal: General experience of comfort will improve Outcome: Progressing   Problem: Safety: Goal: Ability to remain free from injury will improve Outcome: Progressing   

## 2017-09-01 NOTE — Plan of Care (Signed)
  Problem: Pain Managment: Goal: General experience of comfort will improve Outcome: Not Progressing  Continues to complain of pain and nausea.  WIll continue to monitor.

## 2017-09-02 MED ORDER — OXYCODONE-ACETAMINOPHEN 5-325 MG PO TABS: 1.0000 | ORAL_TABLET | Freq: Two times a day (BID) | ORAL | 0 refills | Status: DC

## 2017-09-02 MED ORDER — SODIUM CHLORIDE 0.9% FLUSH
3.0000 mL | Freq: Two times a day (BID) | INTRAVENOUS | Status: DC
  Administered 2017-09-02: 3 mL via INTRAVENOUS

## 2017-09-02 MED ORDER — PROMETHAZINE HCL 12.5 MG PO TABS: 12.5000 mg | ORAL_TABLET | Freq: Four times a day (QID) | ORAL | 0 refills | Status: DC | PRN

## 2017-09-02 MED ORDER — LOPERAMIDE HCL 2 MG PO TABS: 2.0000 mg | ORAL_TABLET | Freq: Three times a day (TID) | ORAL | 0 refills | Status: AC

## 2017-09-02 NOTE — Progress Notes (Signed)
[facility] - [place] [facility]    Diane Warner was admitted to the Hospital on 08/29/2017 and Discharged  09/02/2017 and should be excused from work/school   for 8  days starting 08/29/2017 , may return to work/school without any restrictions.  Call Auburn BilberryShreyang Manasi Dishon MD with questions.  Auburn BilberryShreyang Jariyah Hackley M.D on 09/02/2017,at 11:46 AM  [facility] - [place] [facility]    Office  [phone]

## 2017-09-02 NOTE — Discharge Summary (Signed)
[facility] - [place] at [facility], 52 y.o., DOB 17-Nov-1965, MRN [id number]. Admission date: 08/29/2017 Discharge Date 09/02/2017 Primary MD Center, [facility] Admitting Physician Cammy Copa, MD  Admission Diagnosis  Dehydration [E86.0] Urinary tract infection without hematuria, site unspecified [N39.0] Intractable vomiting with nausea, unspecified vomiting type [R11.2]  Discharge Diagnosis   Active Problems: Gastroenteritis with nausea vomiting Acute cystitis with hematuria status post treatment Migraine Hypokalemia Substance abuse    Hospital Course Patient is 52 year old female presented with nausea vomiting generalized body aches and diarrhea.  Patient had a flu test which was negative.  She was admitted and treated for acute gastroenteritis.  She was given IV fluids.  She continued to complain of diarrhea therefore was started on Imodium.  Stool for C. difficile was negative.  Her diarrhea has resolved she continues to complain of some headache.  She has has not had any fevers or chills she is clinically doing much better and stable for discharge.          Consults  none  Significant Tests:  See full reports for all details     Dg Chest 2 View  Result Date: 08/29/2017 CLINICAL DATA:  Weakness and dehydration EXAM: CHEST - 2 VIEW COMPARISON:  October 19, 2016 FINDINGS: There is no edema or consolidation. Heart size and pulmonary vascularity are normal. No adenopathy. No pneumothorax. No bone lesions. IMPRESSION: No edema or consolidation. Electronically Signed   By: Bretta Bang III M.D.   On: 08/29/2017 19:39   Ct Head Wo Contrast  Result Date: 08/29/2017 CLINICAL DATA:  Severe headache for 4-5 days. EXAM: CT HEAD WITHOUT CONTRAST TECHNIQUE: Contiguous axial images were obtained from the base of the skull through the vertex without intravenous contrast. COMPARISON:  Head CT 05/29/2017 FINDINGS: Brain: No mass lesion,  intraparenchymal hemorrhage or extra-axial collection. No evidence of acute cortical infarct. Normal appearance of the brain parenchyma and extra axial spaces for age. Vascular: No hyperdense vessel or unexpected vascular calcification. Skull: Normal visualized skull base, calvarium and extracranial soft tissues. Sinuses/Orbits: No sinus fluid levels or advanced mucosal thickening. No mastoid effusion. Normal orbits. IMPRESSION: Normal head CT. Electronically Signed   By: Deatra Robinson M.D.   On: 08/29/2017 19:00   Ct Abdomen Pelvis W Contrast  Result Date: 08/31/2017 CLINICAL DATA:  Intractable nausea and vomiting EXAM: CT ABDOMEN AND PELVIS WITH CONTRAST TECHNIQUE: Multidetector CT imaging of the abdomen and pelvis was performed using the standard protocol following bolus administration of intravenous contrast. CONTRAST:  ISOVUE-300 IOPAMIDOL (ISOVUE-300) INJECTION 61% COMPARISON:  None. FINDINGS: Lower chest: Lung bases are free of acute infiltrate or sizable effusion. Hepatobiliary: No focal liver abnormality is seen. Status post cholecystectomy. No biliary dilatation. Pancreas: Unremarkable. No pancreatic ductal dilatation or surrounding inflammatory changes. Spleen: Normal in size without focal abnormality. Adrenals/Urinary Tract: Adrenal glands are within normal limits. Kidneys demonstrate a few scattered small hypodensities consistent with small cysts. No calculi or obstructive changes are seen. Bladder is partially distended. Stomach/Bowel: Stomach shows a small sliding-type hiatal hernia. Appendix appears normal. No evidence of bowel wall thickening, distention, or inflammatory changes. Vascular/Lymphatic: No acute vascular abnormality is noted. No significant lymphadenopathy is identified. Reproductive: Status post hysterectomy. No adnexal masses. Other: No abdominal wall hernia or abnormality. No abdominopelvic ascites. Musculoskeletal: No acute or significant osseous findings. IMPRESSION: No  acute abnormality noted. Electronically Signed   By: Alcide Clever M.D.   On: 08/31/2017 12:27  Today   Subjective:   Diane Warner   doing better still has a headache  Objective:   Blood pressure 132/81, pulse 76, temperature 98.3 F (36.8 C), temperature source Oral, resp. rate 16, height 5\' 4"  (1.626 m), weight 104.5 kg (230 lb 6.4 oz), SpO2 96 %.  .  Intake/Output Summary (Last 24 hours) at 09/02/2017 1418 Last data filed at 09/02/2017 1358 Gross per 24 hour  Intake 360 ml  Output 1200 ml  Net -840 ml    Exam VITAL SIGNS: Blood pressure 132/81, pulse 76, temperature 98.3 F (36.8 C), temperature source Oral, resp. rate 16, height 5\' 4"  (1.626 m), weight 104.5 kg (230 lb 6.4 oz), SpO2 96 %.  GENERAL:  52 y.o.-year-old patient lying in the bed with no acute distress.  EYES: Pupils equal, round, reactive to light and accommodation. No scleral icterus. Extraocular muscles intact.  HEENT: Head atraumatic, normocephalic. Oropharynx and nasopharynx clear.  NECK:  Supple, no jugular venous distention. No thyroid enlargement, no tenderness.  LUNGS: Normal breath sounds bilaterally, no wheezing, rales,rhonchi or crepitation. No use of accessory muscles of respiration.  CARDIOVASCULAR: S1, S2 normal. No murmurs, rubs, or gallops.  ABDOMEN: Soft, nontender, nondistended. Bowel sounds present. No organomegaly or mass.  EXTREMITIES: No pedal edema, cyanosis, or clubbing.  NEUROLOGIC: Cranial nerves II through XII are intact. Muscle strength 5/5 in all extremities. Sensation intact. Gait not checked.  PSYCHIATRIC: The patient is alert and oriented x 3.  SKIN: No obvious rash, lesion, or ulcer.   Data Review     CBC w Diff:  Lab Results  Component Value Date   WBC 7.9 08/29/2017   HGB 15.4 08/29/2017   HGB 13.9 05/13/2014   HCT 46.1 08/29/2017   HCT 41.9 05/13/2014   PLT 258 08/29/2017   PLT 250 05/13/2014   LYMPHOPCT 19 08/29/2017   LYMPHOPCT 19.4 02/27/2014    MONOPCT 8 08/29/2017   MONOPCT 6.1 02/27/2014   EOSPCT 7 08/29/2017   EOSPCT 5.0 02/27/2014   BASOPCT 0 08/29/2017   BASOPCT 0.5 02/27/2014   CMP:  Lab Results  Component Value Date   NA 140 09/01/2017   NA 141 05/13/2014   K 3.7 09/01/2017   K 3.9 05/13/2014   CL 106 09/01/2017   CL 107 05/13/2014   CO2 29 09/01/2017   CO2 28 05/13/2014   BUN 8 09/01/2017   BUN 13 05/13/2014   CREATININE 0.72 09/01/2017   CREATININE 1.15 05/13/2014   PROT 7.9 08/29/2017   PROT 6.7 05/13/2014   ALBUMIN 4.1 08/29/2017   ALBUMIN 3.3 (L) 05/13/2014   BILITOT 0.8 08/29/2017   BILITOT 0.3 05/13/2014   ALKPHOS 74 08/29/2017   ALKPHOS 70 05/13/2014   AST 20 08/29/2017   AST 22 05/13/2014   ALT 10 (L) 08/29/2017   ALT 12 (L) 05/13/2014  .  Micro Results Recent Results (from the past 240 hour(s))  Gastrointestinal Panel by PCR , Stool     Status: None   Collection Time: 08/29/17  5:49 PM  Result Value Ref Range Status   Campylobacter species NOT DETECTED NOT DETECTED Final   Plesimonas shigelloides NOT DETECTED NOT DETECTED Final   Salmonella species NOT DETECTED NOT DETECTED Final   Yersinia enterocolitica NOT DETECTED NOT DETECTED Final   Vibrio species NOT DETECTED NOT DETECTED Final   Vibrio cholerae NOT DETECTED NOT DETECTED Final   Enteroaggregative E coli (EAEC) NOT DETECTED NOT DETECTED Final   Enteropathogenic E coli (EPEC) NOT DETECTED NOT DETECTED  Final   Enterotoxigenic E coli (ETEC) NOT DETECTED NOT DETECTED Final   Shiga like toxin producing E coli (STEC) NOT DETECTED NOT DETECTED Final   Shigella/Enteroinvasive E coli (EIEC) NOT DETECTED NOT DETECTED Final   Cryptosporidium NOT DETECTED NOT DETECTED Final   Cyclospora cayetanensis NOT DETECTED NOT DETECTED Final   Entamoeba histolytica NOT DETECTED NOT DETECTED Final   Giardia lamblia NOT DETECTED NOT DETECTED Final   Adenovirus F40/41 NOT DETECTED NOT DETECTED Final   Astrovirus NOT DETECTED NOT DETECTED Final    Norovirus GI/GII NOT DETECTED NOT DETECTED Final   Rotavirus A NOT DETECTED NOT DETECTED Final   Sapovirus (I, II, IV, and V) NOT DETECTED NOT DETECTED Final    Comment: Performed at [facility], [address] [place], [place] [address]  C difficile quick scan w PCR reflex     Status: None   Collection Time: 08/29/17  5:49 PM  Result Value Ref Range Status   C Diff antigen NEGATIVE NEGATIVE Final   C Diff toxin NEGATIVE NEGATIVE Final   C Diff interpretation No C. difficile detected.  Final    Comment: Performed at [facility], [address] [place], [place] [address]  Urine Culture     Status: None   Collection Time: 08/30/17 12:16 PM  Result Value Ref Range Status   Specimen Description   Final    URINE, RANDOM Performed at [facility], [address] [place], [place] [address]    Special Requests   Final    NONE Performed at [facility], [address] [place], [place] [address]    Culture   Final    NO GROWTH Performed at [facility], [address] [place], [place] [address]    Report Status 08/31/2017 FINAL  Final        Code Status Orders  (From admission, onward)        Start     Ordered   08/29/17 2357  Full code  Continuous     08/29/17 2356    Code Status History    This patient has a current code status but no historical code status.          Follow-up Information    Center, [facility] Follow up in 6 day(s).   Specialty:  General Practice Why:  hosp f/u Contact information: [address] [place] [address] [phone]           Discharge Medications   Allergies as of 09/02/2017      Reactions   Amoxicillin Nausea Only   Aspirin Other (See Comments)   Gi upset   Compazine Hives   Dilaudid [hydromorphone Hcl] Itching   Penicillins Nausea Only   Proton Pump Inhibitors Hives   Zofran [ondansetron Hcl] Diarrhea      Medication List    STOP taking these  medications   prednisoLONE 5 MG (48) Tbpk   predniSONE 10 MG tablet Commonly known as:  DELTASONE     TAKE these medications   albuterol 108 (90 Base) MCG/ACT inhaler Commonly known as:  PROVENTIL HFA;VENTOLIN HFA Inhale 2 puffs into the lungs every 6 (six) hours as needed for wheezing or shortness of breath.   benzonatate 100 MG capsule Commonly known as:  TESSALON PERLES Take 2 capsules (200 mg total) by mouth 3 (three) times daily as needed.   diazepam 2 MG tablet Commonly known as:  VALIUM Take 1 tablet (2 mg total)  by mouth every 8 (eight) hours as needed for muscle spasms.   diclofenac sodium 1 % Gel Commonly known as:  VOLTAREN Apply 2 g topically 4 (four) times daily.   lidocaine 5 % Commonly known as:  LIDODERM Place 1 patch onto the skin every 12 (twelve) hours. Remove & Discard patch within 12 hours or as directed by MD   loperamide 2 MG tablet Commonly known as:  IMODIUM A-D Take 1 tablet (2 mg total) by mouth 3 (three) times daily for 3 days.   meloxicam 15 MG tablet Commonly known as:  MOBIC Take 1 tablet (15 mg total) by mouth daily.   methocarbamol 500 MG tablet Commonly known as:  ROBAXIN Take 1 tablet (500 mg total) by mouth 4 (four) times daily.   oxyCODONE-acetaminophen 5-325 MG tablet Commonly known as:  ROXICET Take 1 tablet by mouth 2 times daily at 12 noon and 4 pm.   promethazine 12.5 MG tablet Commonly known as:  PHENERGAN Take 1 tablet (12.5 mg total) by mouth every 6 (six) hours as needed for nausea or vomiting.   propranolol 40 MG tablet Commonly known as:  INDERAL Take 1 tablet (40 mg total) by mouth 2 (two) times daily.          Total Time in preparing paper work, data evaluation and todays exam - 35 minutes  Auburn Bilberry M.D on 09/02/2017 at 2:18 PM [facility]   Office  [phone]

## 2017-09-05 ENCOUNTER — Telehealth: Payer: Self-pay

## 2017-09-05 NOTE — Telephone Encounter (Signed)
Flagged on EMMI report for not knowing who to call about changes in condition, not having a follow up appointment scheduled, having unfilled prescriptions and being unable to fill them today/tomorrow.   Called and spoke with patient.  She mentioned she is seen by [facility], however does not have the money right now to go see them as she is uninsured.  She reports she was able to get her medications she discharged on.    I asked if patient would be interested in receiving information on [facility] and [facility], both free clinics serving [facility]  [place] residents, as an alternative to [facility].  She expressed interest in receiving information.  Confirmed address to send applications to.    She has no other questions or concerns at this time.  I thanked her for her time and informed her that she would receive one more automated call in the next few days as a final check-in.

## 2017-10-23 ENCOUNTER — Other Ambulatory Visit: Payer: Self-pay

## 2017-10-23 ENCOUNTER — Emergency Department
Admission: EM | Admit: 2017-10-23 | Discharge: 2017-10-23 | Disposition: A | Discharge status: Home / Self Care | Payer: Self-pay | Attending: Emergency Medicine | Admitting: Emergency Medicine

## 2017-10-23 DIAGNOSIS — I10 Essential (primary) hypertension: Secondary | ICD-10-CM | POA: Insufficient documentation

## 2017-10-23 DIAGNOSIS — K0889 Other specified disorders of teeth and supporting structures: Secondary | ICD-10-CM | POA: Insufficient documentation

## 2017-10-23 DIAGNOSIS — J45909 Unspecified asthma, uncomplicated: Secondary | ICD-10-CM | POA: Insufficient documentation

## 2017-10-23 DIAGNOSIS — M26601 Right temporomandibular joint disorder, unspecified: Secondary | ICD-10-CM | POA: Insufficient documentation

## 2017-10-23 DIAGNOSIS — Z87891 Personal history of nicotine dependence: Secondary | ICD-10-CM | POA: Insufficient documentation

## 2017-10-23 DIAGNOSIS — M26609 Unspecified temporomandibular joint disorder, unspecified side: Secondary | ICD-10-CM

## 2017-10-23 MED ORDER — CLINDAMYCIN HCL 150 MG PO CAPS: 300.0000 mg | ORAL_CAPSULE | Freq: Three times a day (TID) | ORAL | 0 refills | Status: DC

## 2017-10-23 MED ORDER — BACLOFEN 10 MG PO TABS: 10.0000 mg | ORAL_TABLET | Freq: Every day | ORAL | 1 refills | Status: AC

## 2017-10-23 MED ORDER — CYCLOBENZAPRINE HCL 10 MG PO TABS
5.0000 mg | ORAL_TABLET | Freq: Once | ORAL | Status: AC
  Administered 2017-10-23: 5 mg via ORAL
  Filled 2017-10-23: qty 1

## 2017-10-23 MED ORDER — KETOROLAC TROMETHAMINE 30 MG/ML IJ SOLN
30.0000 mg | Freq: Once | INTRAMUSCULAR | Status: AC
  Administered 2017-10-23: 30 mg via INTRAMUSCULAR
  Filled 2017-10-23: qty 1

## 2017-10-23 NOTE — ED Notes (Signed)
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

## 2017-10-23 NOTE — Discharge Instructions (Addendum)
Follow-up with your regular doctor if not better in 3 to 5 days.  Use medication as prescribed.  Return emergency department if worsening °

## 2017-10-23 NOTE — ED Triage Notes (Signed)
Pt reports she has TMJ and she is having right sided jaw pain that radiates into ear

## 2017-10-23 NOTE — ED Provider Notes (Signed)
[facility] Emergency Department Provider Note  ____________________________________________   First MD Initiated Contact with Patient 10/23/17 1132     (approximate)  I have reviewed the triage vital signs and the nursing notes.   HISTORY  Chief Complaint Jaw Pain    HPI Diane Warner is a 52 y.o. female presents emergency department complaining of right-sided jaw pain.  States she has history of TMJ and that is gotten worse.  She is been taking ibuprofen without any relief.  She states the pain got so bad today she had to have her boss bring her up here.  She denies any chest pain or shortness of breath.  She denies any fever or chills.  Past Medical History:  Diagnosis Date  . Anxiety   . Arthritis   . Asthma   . Chronic right hip pain   . Degenerative disc disease, thoracic   . GERD (gastroesophageal reflux disease)   . Hypertension   . Irritable bowel syndrome (IBS)   . Scoliosis     Patient Active Problem List   Diagnosis Date Noted  . Gastroenteritis 08/31/2017  . Intractable vomiting with nausea 08/29/2017  . Anxiety 04/11/2016  . Arthritis 04/11/2016  . Asthma 04/11/2016  . Chronic right hip pain 04/11/2016  . Degenerative disc disease, thoracic 04/11/2016  . GERD (gastroesophageal reflux disease) 04/11/2016  . Hypertension 04/11/2016  . Irritable bowel syndrome (IBS) 04/11/2016  . Scoliosis 04/11/2016  . Chronic pain syndrome 04/11/2016  . Chronic bilateral low back pain without sciatica 04/11/2016  . Long term prescription benzodiazepine use 04/11/2016  . Encounter for opiate analgesic use agreement 04/11/2016  . Long term current use of opiate analgesic 04/11/2016  . Long term prescription opiate use 04/11/2016  . Opiate use 04/11/2016  . Obesity, Class II, BMI 35-39.9 04/11/2016    Past Surgical History:  Procedure Laterality Date  . ABDOMINAL HYSTERECTOMY    . CESAREAN SECTION  1985  . CHOLECYSTECTOMY    .  TUBAL LIGATION      Prior to Admission medications   Medication Sig Start Date End Date Taking? Authorizing Provider  baclofen (LIORESAL) 10 MG tablet Take 1 tablet (10 mg total) by mouth daily. 10/23/17 10/23/18  Fisher, Roselyn Bering, PA-C  clindamycin (CLEOCIN) 150 MG capsule Take 2 capsules (300 mg total) by mouth 3 (three) times daily. 10/23/17   Fisher, Roselyn Bering, PA-C    Allergies Amoxicillin; Aspirin; Compazine; Dilaudid [hydromorphone hcl]; Penicillins; Proton pump inhibitors; and Zofran [ondansetron hcl]  History reviewed. No pertinent family history.  Social History Social History   Tobacco Use  . Smoking status: Former Smoker    Last attempt to quit: 02/16/2014    Years since quitting: 3.6  . Smokeless tobacco: Never Used  Substance Use Topics  . Alcohol use: No  . Drug use: No    Review of Systems  Constitutional: No fever/chills Eyes: No visual changes. ENT: No sore throat.  Positive for TMJ pain and dental pain Respiratory: Denies cough Cardiovascular: Denies chest pain or shortness of breath Genitourinary: Negative for dysuria. Musculoskeletal: Negative for back pain. Skin: Negative for rash.    ____________________________________________   PHYSICAL EXAM:  VITAL SIGNS: ED Triage Vitals  Enc Vitals Group     BP 10/23/17 1124 (!) 155/110     Pulse Rate 10/23/17 1124 81     Resp 10/23/17 1124 17     Temp 10/23/17 1124 98.4 F (36.9 C)     Temp Source 10/23/17 1124  Oral     SpO2 10/23/17 1124 99 %     Weight 10/23/17 1125 220 lb (99.8 kg)     Height 10/23/17 1125 5\' 4"  (1.626 m)     Head Circumference --      Peak Flow --      Pain Score 10/23/17 1124 10     Pain Loc --      Pain Edu? --      Excl. in GC? --     Constitutional: Alert and oriented. Well appearing and in no acute distress. Eyes: Conjunctivae are normal.  Head: Atraumatic. Ears: TMs are clear bilaterally Nose: No congestion/rhinnorhea. Mouth/Throat: Mucous membranes are moist.  The  right TMJ joint is tender to palpation.  Pain is reproduced with opening and closing of the jaw.  There is some dental tenderness noted in the right upper molars. Neck: Is supple, no lymphadenopathy is noted Cardiovascular: Normal rate, regular rhythm.  Heart sounds are normal Respiratory: Normal respiratory effort.  No retractions, lungs clear to auscultation GU: deferred Musculoskeletal: FROM all extremities, warm and well perfused Neurologic:  Normal speech and language.  Skin:  Skin is warm, dry and intact. No rash noted. Psychiatric: Mood and affect are normal. Speech and behavior are normal.  ____________________________________________   LABS (all labs ordered are listed, but only abnormal results are displayed)  Labs Reviewed - No data to display ____________________________________________   ____________________________________________  RADIOLOGY    ____________________________________________   PROCEDURES  Procedure(s) performed: Toradol 30 mg IM, Flexeril 5 mg p.o.  Procedures    ____________________________________________   INITIAL IMPRESSION / ASSESSMENT AND PLAN / ED COURSE  Pertinent labs & imaging results that were available during my care of the patient were reviewed by me and considered in my medical decision making (see chart for details).  Patient is 52 year old female presents emergency department complaining of right-sided TMJ pain.  She states that painful to open and close her mouth.  She has been taking ibuprofen without any relief.  On physical exam the patient appears well.  The right TMJ is tender to palpation.  There is some dental tenderness in the right upper molars.  Remainder the exam is unremarkable.  Explained the findings to the patient.  She was given a shot of Toradol 30 mg IM, Flexeril 5 mg p.o.  She was instructed to follow-up with her regular doctor or dental clinic.  She was given a prescription for baclofen 10 mg 3 times  daily and clindamycin due to the dental pain.  She is to return to the emergency department if worsening.  Patient states she understands was discharged in stable condition.     As part of my medical decision making, I reviewed the following data within the electronic MEDICAL RECORD NUMBER Nursing notes reviewed and incorporated, Old chart reviewed, Notes from prior ED visits and [place] Controlled Substance Database  ____________________________________________   FINAL CLINICAL IMPRESSION(S) / ED DIAGNOSES  Final diagnoses:  TMJ (temporomandibular joint syndrome)  Pain, dental      NEW MEDICATIONS STARTED DURING THIS VISIT:  New Prescriptions   BACLOFEN (LIORESAL) 10 MG TABLET    Take 1 tablet (10 mg total) by mouth daily.   CLINDAMYCIN (CLEOCIN) 150 MG CAPSULE    Take 2 capsules (300 mg total) by mouth 3 (three) times daily.     Note:  This document was prepared using Dragon voice recognition software and may include unintentional dictation errors.    Faythe Ghee, PA-C 10/23/17 1154  Don PerkingVeronese, [place], MD 10/24/17 [phone]

## 2018-01-04 ENCOUNTER — Emergency Department
Admission: EM | Admit: 2018-01-04 | Discharge: 2018-01-04 | Disposition: A | Discharge status: Home / Self Care | Payer: Self-pay | Attending: Emergency Medicine | Admitting: Emergency Medicine

## 2018-01-04 ENCOUNTER — Emergency Department: Payer: Self-pay

## 2018-01-04 ENCOUNTER — Other Ambulatory Visit: Payer: Self-pay

## 2018-01-04 DIAGNOSIS — R109 Unspecified abdominal pain: Secondary | ICD-10-CM

## 2018-01-04 DIAGNOSIS — I10 Essential (primary) hypertension: Secondary | ICD-10-CM | POA: Insufficient documentation

## 2018-01-04 DIAGNOSIS — Z79899 Other long term (current) drug therapy: Secondary | ICD-10-CM | POA: Insufficient documentation

## 2018-01-04 DIAGNOSIS — N3000 Acute cystitis without hematuria: Secondary | ICD-10-CM | POA: Insufficient documentation

## 2018-01-04 DIAGNOSIS — J45909 Unspecified asthma, uncomplicated: Secondary | ICD-10-CM | POA: Insufficient documentation

## 2018-01-04 DIAGNOSIS — R1032 Left lower quadrant pain: Secondary | ICD-10-CM

## 2018-01-04 LAB — URINALYSIS, COMPLETE (UACMP) WITH MICROSCOPIC
Bilirubin Urine: NEGATIVE
Glucose, UA: NEGATIVE mg/dL
Ketones, ur: NEGATIVE mg/dL
Nitrite: NEGATIVE
Protein, ur: NEGATIVE mg/dL
Specific Gravity, Urine: 1.026 (ref 1.005–1.030)
WBC, UA: >50 WBC/hpf — ABNORMAL HIGH (ref 0–5)
pH: 5 (ref 5.0–8.0)

## 2018-01-04 LAB — COMPREHENSIVE METABOLIC PANEL
ALT: 9 U/L (ref 0–44)
AST: 18 U/L (ref 15–41)
Albumin: 4 g/dL (ref 3.5–5.0)
Alkaline Phosphatase: 70 U/L (ref 38–126)
Anion gap: 6 (ref 5–15)
BUN: 14 mg/dL (ref 6–20)
CO2: 24 mmol/L (ref 22–32)
Calcium: 8.9 mg/dL (ref 8.9–10.3)
Chloride: 109 mmol/L (ref 98–111)
Creatinine, Ser: 0.93 mg/dL (ref 0.44–1.00)
GFR calc Af Amer: >60 mL/min (ref >60)
Glucose, Bld: 109 mg/dL — ABNORMAL HIGH (ref 70–99)
Potassium: 3.6 mmol/L (ref 3.5–5.1)
Sodium: 139 mmol/L (ref 135–145)
Total Bilirubin: 0.4 mg/dL (ref 0.3–1.2)
Total Protein: 7.6 g/dL (ref 6.5–8.1)

## 2018-01-04 LAB — CBC
HCT: 42.1 % (ref 35.0–47.0)
Hemoglobin: 14.3 g/dL (ref 12.0–16.0)
MCH: 30.4 pg (ref 26.0–34.0)
MCHC: 34.1 g/dL (ref 32.0–36.0)
MCV: 89.2 fL (ref 80.0–100.0)
Platelets: 289 10*3/uL (ref 150–440)
RBC: 4.72 MIL/uL (ref 3.80–5.20)
RDW: 13.9 % (ref 11.5–14.5)
WBC: 9.8 10*3/uL (ref 3.6–11.0)

## 2018-01-04 LAB — LIPASE, BLOOD: Lipase: 35 U/L (ref 11–51)

## 2018-01-04 MED ORDER — CEPHALEXIN 500 MG PO CAPS: 500.0000 mg | ORAL_CAPSULE | Freq: Four times a day (QID) | ORAL | 0 refills | Status: AC

## 2018-01-04 MED ORDER — ONDANSETRON 4 MG PO TBDP: 4.0000 mg | ORAL_TABLET | Freq: Three times a day (TID) | ORAL | 0 refills | Status: DC | PRN

## 2018-01-04 MED ORDER — SODIUM CHLORIDE 0.9 % IV SOLN
INTRAVENOUS | Status: AC
  Filled 2018-01-04: qty 10

## 2018-01-04 MED ORDER — SODIUM CHLORIDE 0.9 % IV SOLN
1.0000 g | Freq: Once | INTRAVENOUS | Status: AC
  Administered 2018-01-04: 1 g via INTRAVENOUS

## 2018-01-04 MED ORDER — ACETAMINOPHEN 500 MG PO TABS
1000.0000 mg | ORAL_TABLET | Freq: Once | ORAL | Status: AC
  Administered 2018-01-04: 1000 mg via ORAL
  Filled 2018-01-04: qty 2

## 2018-01-04 MED ORDER — SODIUM CHLORIDE 0.9 % IV BOLUS
1000.0000 mL | Freq: Once | INTRAVENOUS | Status: AC
  Administered 2018-01-04: 1000 mL via INTRAVENOUS

## 2018-01-04 MED ORDER — KETOROLAC TROMETHAMINE 30 MG/ML IJ SOLN
30.0000 mg | Freq: Once | INTRAMUSCULAR | Status: AC
  Administered 2018-01-04: 30 mg via INTRAVENOUS
  Filled 2018-01-04: qty 1

## 2018-01-04 MED ORDER — ONDANSETRON HCL 4 MG/2ML IJ SOLN
4.0000 mg | Freq: Once | INTRAMUSCULAR | Status: AC
  Administered 2018-01-04: 4 mg via INTRAVENOUS
  Filled 2018-01-04: qty 2

## 2018-01-04 NOTE — Discharge Instructions (Signed)
Please drink plenty of fluid to stay well-hydrated, and to help clear your urinary tract infection.  Please take the entire course of antibiotics, even if you are feeling better.  You may take Tylenol or Motrin for pain, and Zofran is for nausea.  Return to the emergency department if you develop severe pain, inability to keep down fluids, lightheadedness or fainting, fever, or any other symptoms concerning to you.

## 2018-01-04 NOTE — ED Triage Notes (Signed)
Pt c/o aching pain in left lower abd that radiates into back - c/o N/V/D (vomited x5 in 24 hours - 4 loose stools in 24 hours) - denies pain or frequency with urination

## 2018-01-04 NOTE — ED Provider Notes (Signed)
[facility] Emergency Department Provider Note  ____________________________________________  Time seen: Approximately 7:46 PM  I have reviewed the triage vital signs and the nursing notes.   HISTORY  Chief Complaint Abdominal Pain and Back Pain    HPI CASSARA NIDA is a 52 y.o. female with a history of HTN and IBS presenting with left flank pain and left lower quadrant pain.  Patient reports that starting at 3 AM, she developed a sharp pain in the left flank and left lower quadrant associated with nausea but no vomiting.  She has also had several episodes of watery stool that is nonbloody.  She has not had any fever chills, dysuria, urinary frequency or change in vaginal discharge.  She has not tried anything that has helped her pain.  Past Medical History:  Diagnosis Date  . Anxiety   . Arthritis   . Asthma   . Chronic right hip pain   . Degenerative disc disease, thoracic   . GERD (gastroesophageal reflux disease)   . Hypertension   . Irritable bowel syndrome (IBS)   . Scoliosis     Patient Active Problem List   Diagnosis Date Noted  . Gastroenteritis 08/31/2017  . Intractable vomiting with nausea 08/29/2017  . Anxiety 04/11/2016  . Arthritis 04/11/2016  . Asthma 04/11/2016  . Chronic right hip pain 04/11/2016  . Degenerative disc disease, thoracic 04/11/2016  . GERD (gastroesophageal reflux disease) 04/11/2016  . Hypertension 04/11/2016  . Irritable bowel syndrome (IBS) 04/11/2016  . Scoliosis 04/11/2016  . Chronic pain syndrome 04/11/2016  . Chronic bilateral low back pain without sciatica 04/11/2016  . Long term prescription benzodiazepine use 04/11/2016  . Encounter for opiate analgesic use agreement 04/11/2016  . Long term current use of opiate analgesic 04/11/2016  . Long term prescription opiate use 04/11/2016  . Opiate use 04/11/2016  . Obesity, Class II, BMI 35-39.9 04/11/2016    Past Surgical History:  Procedure  Laterality Date  . ABDOMINAL HYSTERECTOMY    . CESAREAN SECTION  1985  . CHOLECYSTECTOMY    . TUBAL LIGATION      Current Outpatient Rx  . Order #: [id number] Class: Print  . Order #: [id number] Class: Normal  . Order #: [id number] Class: Print  . Order #: [id number] Class: Normal    Allergies Amoxicillin; Aspirin; Compazine; Dilaudid [hydromorphone hcl]; Penicillins; Proton pump inhibitors; and Zofran [ondansetron hcl]  No family history on file.  Social History Social History   Tobacco Use  . Smoking status: Former Smoker    Last attempt to quit: 02/16/2014    Years since quitting: 3.8  . Smokeless tobacco: Never Used  Substance Use Topics  . Alcohol use: No  . Drug use: No    Review of Systems Constitutional: No fever/chills.  No lightheadedness or syncope. Eyes: No visual changes. ENT: No sore throat. No congestion or rhinorrhea. Cardiovascular: Denies chest pain. Denies palpitations. Respiratory: Denies shortness of breath.  No cough. Gastrointestinal: Positive left flank and left lower quadrant abdominal pain.  Positive nausea, no vomiting.  Positive nonbloody diarrhea.  No constipation. Genitourinary: Negative for dysuria.  No urinary frequency.  No change in vaginal discharge. Musculoskeletal: Negative for back pain. Skin: Negative for rash. Neurological: Negative for headaches. No focal numbness, tingling or weakness.     ____________________________________________   PHYSICAL EXAM:  VITAL SIGNS: ED Triage Vitals  Enc Vitals Group     BP 01/04/18 1651 (!) 159/95     Pulse Rate 01/04/18 1651 86     Resp  01/04/18 1651 16     Temp 01/04/18 1651 98.8 F (37.1 C)     Temp Source 01/04/18 1651 Oral     SpO2 01/04/18 1651 99 %     Weight 01/04/18 1649 230 lb (104.3 kg)     Height 01/04/18 1649 5\' 4"  (1.626 m)     Head Circumference --      Peak Flow --      Pain Score 01/04/18 1648 8     Pain Loc --      Pain Edu? --      Excl. in [place]? --      Constitutional: Alert and oriented.Answers questions appropriately. Eyes: Conjunctivae are normal.  EOMI. No scleral icterus. Head: Atraumatic. Nose: No congestion/rhinnorhea. Mouth/Throat: Mucous membranes are moist.  Neck: No stridor.  Supple.  No meningismus. Cardiovascular: Normal rate, regular rhythm. No murmurs, rubs or gallops.  Respiratory: Normal respiratory effort.  No accessory muscle use or retractions. Lungs CTAB.  No wheezes, rales or ronchi. Gastrointestinal: Soft, and nondistended.  No reproducible left CVA tenderness.  Mild left lower quadrant tenderness to palpation.  No guarding or rebound.  No peritoneal signs. Musculoskeletal: No LE edema. Neurologic:  A&Ox3.  Speech is clear.  Face and smile are symmetric.  EOMI.  Moves all extremities well. Skin:  Skin is warm, dry and intact. No rash noted. Psychiatric: Mood and affect are normal. Speech and behavior are normal.  Normal judgement.  ____________________________________________   LABS (all labs ordered are listed, but only abnormal results are displayed)  Labs Reviewed  COMPREHENSIVE METABOLIC PANEL - Abnormal; Notable for the following components:      Result Value   Glucose, Bld 109 (*)    All other components within normal limits  URINALYSIS, COMPLETE (UACMP) WITH MICROSCOPIC - Abnormal; Notable for the following components:   Color, Urine YELLOW (*)    APPearance CLOUDY (*)    Hgb urine dipstick MODERATE (*)    Leukocytes, UA LARGE (*)    WBC, UA >50 (*)    Bacteria, UA FEW (*)    All other components within normal limits  URINE CULTURE  LIPASE, BLOOD  CBC   ____________________________________________  EKG  Not indicated ____________________________________________  RADIOLOGY  Ct Renal Stone Study  Result Date: 01/04/2018 CLINICAL DATA:  52 y/o F; left lower abdominal pain radiating to the flank. EXAM: CT ABDOMEN AND PELVIS WITHOUT CONTRAST TECHNIQUE: Multidetector CT imaging of the  abdomen and pelvis was performed following the standard protocol without IV contrast. COMPARISON:  08/31/2017 CT abdomen and pelvis. FINDINGS: Lower chest: No acute abnormality. Hepatobiliary: No focal liver abnormality is seen. Status post cholecystectomy. No biliary dilatation. Pancreas: Unremarkable. No pancreatic ductal dilatation or surrounding inflammatory changes. Spleen: Normal in size without focal abnormality. Adrenals/Urinary Tract: Adrenal glands are unremarkable. Stable small renal cysts. Kidneys are otherwise normal, without renal calculi, focal lesion, or hydronephrosis. Bladder is unremarkable. Stomach/Bowel: Stomach is within normal limits. Appendix appears normal. No evidence of bowel wall thickening, distention, or inflammatory changes. Vascular/Lymphatic: No significant vascular findings are present. No enlarged abdominal or pelvic lymph nodes. Reproductive: Status post hysterectomy. No adnexal masses. Other: No abdominal wall hernia or abnormality. No abdominopelvic ascites. Musculoskeletal: No acute or significant osseous findings. Mild lumbar spondylosis with prominent lower lumbar facet arthrosis is stable. IMPRESSION: No acute process identified. Stable negative CT of the abdomen and pelvis. Electronically Signed   By: Mitzi Hansen M.D.   On: 01/04/2018 20:21    ____________________________________________   PROCEDURES  Procedure(s) performed: None  Procedures  Critical Care performed: No ____________________________________________   INITIAL IMPRESSION / ASSESSMENT AND PLAN / ED COURSE  Pertinent labs & imaging results that were available during my care of the patient were reviewed by me and considered in my medical decision making (see chart for details).  52 y.o. female presenting with left flank pain and left lower quadrant pain associate with nausea but no vomiting.  Overall, the patient is hemodynamically stable and afebrile.  Her laboratory studies are  indicative of a UTI and I will treat her with IV Rocephin.  She will also be given intravenous fluids as well as symptomatic treatment for her symptoms.  Given the pain that the patient has my examination in the left lower quadrant.  I will also get a CT scan to rule out diverticulitis or renal colic that would be causing the UTI.  Plan reevaluation for final disposition.  ----------------------------------------- 9:33 PM on 01/04/2018 -----------------------------------------  At this time, the patient is feeling significantly better and has received her antibiotics.  She is stable for discharge home.  She will continue on outpatient antibiotic course and follow-up closely with her PMD.  Return precautions were discussed.  ____________________________________________  FINAL CLINICAL IMPRESSION(S) / ED DIAGNOSES  Final diagnoses:  Acute cystitis without hematuria  Left flank pain  Left lower quadrant pain         NEW MEDICATIONS STARTED DURING THIS VISIT:  New Prescriptions   CEPHALEXIN (KEFLEX) 500 MG CAPSULE    Take 1 capsule (500 mg total) by mouth 4 (four) times daily for 7 days.   ONDANSETRON (ZOFRAN ODT) 4 MG DISINTEGRATING TABLET    Take 1 tablet (4 mg total) by mouth every 8 (eight) hours as needed for nausea or vomiting.      Rockne MenghiniNorman, Anne-Caroline, MD 01/04/18 2133

## 2018-01-07 LAB — URINE CULTURE: Culture: 30000 — AB

## 2018-05-28 ENCOUNTER — Emergency Department
Admission: EM | Admit: 2018-05-28 | Discharge: 2018-05-28 | Disposition: A | Discharge status: Home / Self Care | Payer: Self-pay | Attending: Emergency Medicine | Admitting: Emergency Medicine

## 2018-05-28 ENCOUNTER — Emergency Department: Payer: Self-pay

## 2018-05-28 ENCOUNTER — Other Ambulatory Visit: Payer: Self-pay

## 2018-05-28 DIAGNOSIS — J45909 Unspecified asthma, uncomplicated: Secondary | ICD-10-CM | POA: Insufficient documentation

## 2018-05-28 DIAGNOSIS — M549 Dorsalgia, unspecified: Secondary | ICD-10-CM

## 2018-05-28 DIAGNOSIS — Z79899 Other long term (current) drug therapy: Secondary | ICD-10-CM | POA: Insufficient documentation

## 2018-05-28 DIAGNOSIS — Z87891 Personal history of nicotine dependence: Secondary | ICD-10-CM | POA: Insufficient documentation

## 2018-05-28 DIAGNOSIS — I10 Essential (primary) hypertension: Secondary | ICD-10-CM | POA: Insufficient documentation

## 2018-05-28 DIAGNOSIS — M5441 Lumbago with sciatica, right side: Secondary | ICD-10-CM | POA: Insufficient documentation

## 2018-05-28 DIAGNOSIS — R1084 Generalized abdominal pain: Secondary | ICD-10-CM | POA: Insufficient documentation

## 2018-05-28 LAB — CBC WITH DIFFERENTIAL/PLATELET
Abs Immature Granulocytes: 0.03 10*3/uL (ref 0.00–0.07)
Basophils Absolute: 0.1 10*3/uL (ref 0.0–0.1)
Basophils Relative: 1 %
EOS PCT: 5 %
Eosinophils Absolute: 0.4 10*3/uL (ref 0.0–0.5)
HCT: 45.9 % (ref 36.0–46.0)
Hemoglobin: 14.9 g/dL (ref 12.0–15.0)
Immature Granulocytes: 0 %
LYMPHS ABS: 2.3 10*3/uL (ref 0.7–4.0)
Lymphocytes Relative: 29 %
MCH: 29.9 pg (ref 26.0–34.0)
MCHC: 32.5 g/dL (ref 30.0–36.0)
MCV: 92 fL (ref 80.0–100.0)
Monocytes Absolute: 0.7 10*3/uL (ref 0.1–1.0)
Monocytes Relative: 8 %
Neutro Abs: 4.5 10*3/uL (ref 1.7–7.7)
Neutrophils Relative %: 57 %
Platelets: 279 10*3/uL (ref 150–400)
RBC: 4.99 MIL/uL (ref 3.87–5.11)
RDW: 13.5 % (ref 11.5–15.5)
WBC: 8 10*3/uL (ref 4.0–10.5)
nRBC: 0 % (ref 0.0–0.2)

## 2018-05-28 LAB — URINALYSIS, COMPLETE (UACMP) WITH MICROSCOPIC
Bacteria, UA: NONE SEEN
Bilirubin Urine: NEGATIVE
GLUCOSE, UA: NEGATIVE mg/dL
Ketones, ur: NEGATIVE mg/dL
NITRITE: NEGATIVE
PH: 6 (ref 5.0–8.0)
Protein, ur: NEGATIVE mg/dL
Specific Gravity, Urine: 1.038 — ABNORMAL HIGH (ref 1.005–1.030)

## 2018-05-28 LAB — BASIC METABOLIC PANEL
Anion gap: 7 (ref 5–15)
BUN: 9 mg/dL (ref 6–20)
CO2: 26 mmol/L (ref 22–32)
Calcium: 9.1 mg/dL (ref 8.9–10.3)
Chloride: 106 mmol/L (ref 98–111)
Creatinine, Ser: 0.83 mg/dL (ref 0.44–1.00)
GFR calc non Af Amer: >60 mL/min (ref >60)
Glucose, Bld: 100 mg/dL — ABNORMAL HIGH (ref 70–99)
Potassium: 3.7 mmol/L (ref 3.5–5.1)
Sodium: 139 mmol/L (ref 135–145)

## 2018-05-28 MED ORDER — PREDNISONE 10 MG (21) PO TBPK: ORAL_TABLET | ORAL | 0 refills | Status: DC

## 2018-05-28 MED ORDER — MORPHINE SULFATE (PF) 4 MG/ML IV SOLN
4.0000 mg | Freq: Once | INTRAVENOUS | Status: AC
  Administered 2018-05-28: 4 mg via INTRAVENOUS
  Filled 2018-05-28: qty 1

## 2018-05-28 MED ORDER — ONDANSETRON HCL 4 MG/2ML IJ SOLN
4.0000 mg | Freq: Once | INTRAMUSCULAR | Status: DC
  Filled 2018-05-28: qty 2

## 2018-05-28 MED ORDER — IOHEXOL 300 MG/ML  SOLN
100.0000 mL | Freq: Once | INTRAMUSCULAR | Status: AC | PRN
  Administered 2018-05-28: 100 mL via INTRAVENOUS
  Filled 2018-05-28: qty 100

## 2018-05-28 MED ORDER — KETOROLAC TROMETHAMINE 30 MG/ML IJ SOLN
30.0000 mg | Freq: Once | INTRAMUSCULAR | Status: AC
  Administered 2018-05-28: 30 mg via INTRAMUSCULAR
  Filled 2018-05-28: qty 1

## 2018-05-28 MED ORDER — PROMETHAZINE HCL 25 MG/ML IJ SOLN
25.0000 mg | Freq: Once | INTRAMUSCULAR | Status: AC
  Administered 2018-05-28: 25 mg via INTRAVENOUS
  Filled 2018-05-28: qty 1

## 2018-05-28 MED ORDER — CYCLOBENZAPRINE HCL 10 MG PO TABS: 10.0000 mg | ORAL_TABLET | Freq: Three times a day (TID) | ORAL | 0 refills | Status: DC | PRN

## 2018-05-28 MED ORDER — TRAMADOL HCL 50 MG PO TABS: 50.0000 mg | ORAL_TABLET | Freq: Four times a day (QID) | ORAL | 0 refills | Status: DC | PRN

## 2018-05-28 NOTE — Discharge Instructions (Signed)
Follow-up with your regular doctor or [facility] clinic orthopedics if not better in 3 to 5 days.  Use medications as prescribed.  Return to emergency department if worsening.  Apply ice to your lower back.

## 2018-05-28 NOTE — ED Provider Notes (Signed)
[facility] Emergency Department Provider Note  ____________________________________________   First MD Initiated Contact with Patient 05/28/18 1820     (approximate)  I have reviewed the triage vital signs and the nursing notes.   HISTORY  Chief Complaint Hip Pain    HPI Diane Warner is a 53 y.o. female presents emergency department complaining of right hip pain that radiates into the right leg.  She had an injury 2 years ago she states that suddenly the pain got severe yesterday.  Pain is in the back.  She also does have a slight abdominal pain.  No vomiting or diarrhea.  Denies any fever or chills.  She still has an appendix.    Past Medical History:  Diagnosis Date  . Anxiety   . Arthritis   . Asthma   . Chronic right hip pain   . Degenerative disc disease, thoracic   . GERD (gastroesophageal reflux disease)   . Hypertension   . Irritable bowel syndrome (IBS)   . Scoliosis     Patient Active Problem List   Diagnosis Date Noted  . Gastroenteritis 08/31/2017  . Intractable vomiting with nausea 08/29/2017  . Anxiety 04/11/2016  . Arthritis 04/11/2016  . Asthma 04/11/2016  . Chronic right hip pain 04/11/2016  . Degenerative disc disease, thoracic 04/11/2016  . GERD (gastroesophageal reflux disease) 04/11/2016  . Hypertension 04/11/2016  . Irritable bowel syndrome (IBS) 04/11/2016  . Scoliosis 04/11/2016  . Chronic pain syndrome 04/11/2016  . Chronic bilateral low back pain without sciatica 04/11/2016  . Long term prescription benzodiazepine use 04/11/2016  . Encounter for opiate analgesic use agreement 04/11/2016  . Long term current use of opiate analgesic 04/11/2016  . Long term prescription opiate use 04/11/2016  . Opiate use 04/11/2016  . Obesity, Class II, BMI 35-39.9 04/11/2016    Past Surgical History:  Procedure Laterality Date  . ABDOMINAL HYSTERECTOMY    . CESAREAN SECTION  1985  . CHOLECYSTECTOMY    . TUBAL  LIGATION      Prior to Admission medications   Medication Sig Start Date End Date Taking? Authorizing Provider  baclofen (LIORESAL) 10 MG tablet Take 1 tablet (10 mg total) by mouth daily. 10/23/17 10/23/18  Jeni Duling, Linden Dolin, PA-C  cyclobenzaprine (FLEXERIL) 10 MG tablet Take 1 tablet (10 mg total) by mouth 3 (three) times daily as needed. 05/28/18   Arzu Mcgaughey, Linden Dolin, PA-C  predniSONE (STERAPRED UNI-PAK 21 TAB) 10 MG (21) TBPK tablet Take 6 pills on day one then decrease by 1 pill each day 05/28/18   Versie Starks, PA-C  traMADol (ULTRAM) 50 MG tablet Take 1 tablet (50 mg total) by mouth every 6 (six) hours as needed. 05/28/18   Caryn Section Linden Dolin, PA-C    Allergies Zofran Alvis Lemmings hcl]; Amoxicillin; Aspirin; Compazine; Dilaudid [hydromorphone hcl]; Penicillins; and Proton pump inhibitors  History reviewed. No pertinent family history.  Social History Social History   Tobacco Use  . Smoking status: Former Smoker    Last attempt to quit: 02/16/2014    Years since quitting: 4.2  . Smokeless tobacco: Never Used  Substance Use Topics  . Alcohol use: No  . Drug use: No    Review of Systems  Constitutional: No fever/chills Eyes: No visual changes. ENT: No sore throat. Respiratory: Denies cough Gastrointestinal: Positive for right lower quadrant pain Genitourinary: Negative for dysuria. Musculoskeletal: Positive for back pain. Skin: Negative for rash.    ____________________________________________   PHYSICAL EXAM:  VITAL SIGNS: ED  Triage Vitals  Enc Vitals Group     BP 05/28/18 1711 (!) 156/105     Pulse Rate 05/28/18 1711 97     Resp 05/28/18 1711 17     Temp 05/28/18 1711 98.2 F (36.8 C)     Temp Source 05/28/18 1711 Oral     SpO2 05/28/18 1711 97 %     Weight 05/28/18 1712 229 lb (103.9 kg)     Height 05/28/18 1712 '5\' 4"'  (1.626 m)     Head Circumference --      Peak Flow --      Pain Score 05/28/18 1712 7     Pain Loc --      Pain Edu? --      Excl. in [place]? --       Constitutional: Alert and oriented. Well appearing and in no acute distress. Eyes: Conjunctivae are normal.  Head: Atraumatic. Nose: No congestion/rhinnorhea. Mouth/Throat: Mucous membranes are moist.   Neck:  supple no lymphadenopathy noted Cardiovascular: Normal rate, regular rhythm. Heart sounds are normal Respiratory: Normal respiratory effort.  No retractions, lungs c t a  Abd: soft tender in the right lower quadrant.  Bs normal all 4 quad, right lower quadrant pain is reproduced with obturator sign GU: deferred Musculoskeletal: Lumbar spine and trochanter area of the right thigh are tender.  Pain is reproduced into the right lower quadrant and at the leg and back with movement of the right leg.   Neurologic:  Normal speech and language.  Skin:  Skin is warm, dry and intact. No rash noted. Psychiatric: Mood and affect are normal. Speech and behavior are normal.  ____________________________________________   LABS (all labs ordered are listed, but only abnormal results are displayed)  Labs Reviewed  BASIC METABOLIC PANEL - Abnormal; Notable for the following components:      Result Value   Glucose, Bld 100 (*)    All other components within normal limits  URINALYSIS, COMPLETE (UACMP) WITH MICROSCOPIC - Abnormal; Notable for the following components:   Color, Urine YELLOW (*)    APPearance HAZY (*)    Specific Gravity, Urine 1.038 (*)    Hgb urine dipstick SMALL (*)    Leukocytes, UA SMALL (*)    All other components within normal limits  CBC WITH DIFFERENTIAL/PLATELET   ____________________________________________   ____________________________________________  RADIOLOGY  CT of the abdomen-pelvis is negative for appendicitis.  No acute abnormality is noted in the abdomen. No charge CT of the lumbar spine showed facet arthropathy.  ____________________________________________   PROCEDURES  Procedure(s) performed: Saline lock, morphine 4 mg IV, Zofran 4 mg  IV  Procedures    ____________________________________________   INITIAL IMPRESSION / ASSESSMENT AND PLAN / ED COURSE  Pertinent labs & imaging results that were available during my care of the patient were reviewed by me and considered in my medical decision making (see chart for details).   Patient is 53 year old female presents emergency department complaint of right lower back pain, right hip pain, and radiation to the right foot.  She is also complaining of some right lower quadrant pain.  Physical exam shows that the abdomen is tender in the right lower quadrant.  Lumbar spine is mildly tender.  Trochanter area of the right hip is very tender.  Neurovascular is intact.  Patient was given morphine 4 mg IV, Phenergan 25 mg IV.   CBC, met B are normal, urinalysis has small amount hemoglobin and small leuks but no bacteria.  Due to there  being no bacteria the patient is asymptomatic she will not be treated with the antibiotic.   CT abdomen pelvis with IV contrast is negative for any acute abnormality.  CT of the lumbar spine is negative for any acute abnormality.  Explained all of the findings to the patient.  She states she is still uncomfortable.  She was given additional morphine 4 mg IV, and Toradol 30 mg IV.  She is to follow-up with orthopedics.  Take prescription for Sterapred, Flexeril, and tramadol was sent to [place].  She is apply ice to all areas that hurt.  She states she understands will comply.  She was discharged in stable condition.     As part of my medical decision making, I reviewed the following data within the [place] notes reviewed and incorporated, Labs reviewed CBC and met B are normal, UA is normal, Old chart reviewed, Radiograph reviewed CT/abdomen pelvis negative, CT lumbar spine is negative, Notes from prior ED visits and [place] Controlled Substance Database  ____________________________________________   FINAL CLINICAL  IMPRESSION(S) / ED DIAGNOSES  Final diagnoses:  Back pain  Acute midline low back pain with right-sided sciatica      NEW MEDICATIONS STARTED DURING THIS VISIT:  New Prescriptions   CYCLOBENZAPRINE (FLEXERIL) 10 MG TABLET    Take 1 tablet (10 mg total) by mouth 3 (three) times daily as needed.   PREDNISONE (STERAPRED UNI-PAK 21 TAB) 10 MG (21) TBPK TABLET    Take 6 pills on day one then decrease by 1 pill each day   TRAMADOL (ULTRAM) 50 MG TABLET    Take 1 tablet (50 mg total) by mouth every 6 (six) hours as needed.     Note:  This document was prepared using Dragon voice recognition software and may include unintentional dictation errors.    Versie Starks, PA-C 05/28/18 2105    Schaevitz, Randall An, MD 05/29/18 [phone]

## 2018-05-28 NOTE — ED Notes (Signed)
See triage note  Presents with pain to right hip and into right leg  States she had an injury about 2 years ago    Was to have surgery but lost her insurance   States she has been trying OTC meds and ice/heat for pain

## 2018-05-28 NOTE — ED Notes (Signed)
Pt is being discharged to home with family. Pt is AOx4, VSS, she c/o pain at 7/10 but does not show any signs of distress. AVS was given and explained to the pt and she verbalized understanding of the information.

## 2018-05-28 NOTE — ED Triage Notes (Signed)
Pt c/o having 2 torn muscles in the right hip from an injury 2 years ago and states she lost her insurance and was not able to have the surgery she needs. Pt c/o increased pain in the past couple of days.

## 2018-06-08 ENCOUNTER — Emergency Department: Payer: Self-pay

## 2018-06-08 ENCOUNTER — Emergency Department
Admission: EM | Admit: 2018-06-08 | Discharge: 2018-06-08 | Disposition: A | Discharge status: Home / Self Care | Payer: Self-pay | Attending: Emergency Medicine | Admitting: Emergency Medicine

## 2018-06-08 ENCOUNTER — Encounter: Payer: Self-pay | Admitting: *Deleted

## 2018-06-08 DIAGNOSIS — B9789 Other viral agents as the cause of diseases classified elsewhere: Secondary | ICD-10-CM

## 2018-06-08 DIAGNOSIS — I10 Essential (primary) hypertension: Secondary | ICD-10-CM | POA: Insufficient documentation

## 2018-06-08 DIAGNOSIS — Z87891 Personal history of nicotine dependence: Secondary | ICD-10-CM | POA: Insufficient documentation

## 2018-06-08 DIAGNOSIS — J069 Acute upper respiratory infection, unspecified: Secondary | ICD-10-CM | POA: Insufficient documentation

## 2018-06-08 DIAGNOSIS — Z79899 Other long term (current) drug therapy: Secondary | ICD-10-CM | POA: Insufficient documentation

## 2018-06-08 DIAGNOSIS — J45909 Unspecified asthma, uncomplicated: Secondary | ICD-10-CM | POA: Insufficient documentation

## 2018-06-08 LAB — COMPREHENSIVE METABOLIC PANEL
ALT: 9 U/L (ref 0–44)
ANION GAP: 6 (ref 5–15)
AST: 17 U/L (ref 15–41)
Albumin: 3.6 g/dL (ref 3.5–5.0)
Alkaline Phosphatase: 70 U/L (ref 38–126)
BUN: 16 mg/dL (ref 6–20)
CO2: 26 mmol/L (ref 22–32)
Calcium: 8.4 mg/dL — ABNORMAL LOW (ref 8.9–10.3)
Chloride: 106 mmol/L (ref 98–111)
Creatinine, Ser: 0.8 mg/dL (ref 0.44–1.00)
GFR calc non Af Amer: >60 mL/min (ref >60)
Glucose, Bld: 130 mg/dL — ABNORMAL HIGH (ref 70–99)
Potassium: 3.7 mmol/L (ref 3.5–5.1)
SODIUM: 138 mmol/L (ref 135–145)
Total Bilirubin: 0.5 mg/dL (ref 0.3–1.2)
Total Protein: 7.2 g/dL (ref 6.5–8.1)

## 2018-06-08 LAB — CBC WITH DIFFERENTIAL/PLATELET
Abs Immature Granulocytes: 0.12 10*3/uL — ABNORMAL HIGH (ref 0.00–0.07)
BASOS PCT: 0 %
Basophils Absolute: 0 10*3/uL (ref 0.0–0.1)
Eosinophils Absolute: 0.5 10*3/uL (ref 0.0–0.5)
Eosinophils Relative: 5 %
HCT: 47.8 % — ABNORMAL HIGH (ref 36.0–46.0)
Hemoglobin: 14.9 g/dL (ref 12.0–15.0)
Immature Granulocytes: 1 %
Lymphocytes Relative: 15 %
Lymphs Abs: 1.7 10*3/uL (ref 0.7–4.0)
MCH: 29.6 pg (ref 26.0–34.0)
MCHC: 31.2 g/dL (ref 30.0–36.0)
MCV: 95 fL (ref 80.0–100.0)
Monocytes Absolute: 0.8 10*3/uL (ref 0.1–1.0)
Monocytes Relative: 7 %
NRBC: 0 % (ref 0.0–0.2)
Neutro Abs: 8.3 10*3/uL — ABNORMAL HIGH (ref 1.7–7.7)
Neutrophils Relative %: 72 %
Platelets: 302 10*3/uL (ref 150–400)
RBC: 5.03 MIL/uL (ref 3.87–5.11)
RDW: 13.6 % (ref 11.5–15.5)
WBC: 11.4 10*3/uL — ABNORMAL HIGH (ref 4.0–10.5)

## 2018-06-08 LAB — URINALYSIS, ROUTINE W REFLEX MICROSCOPIC
Bacteria, UA: NONE SEEN
Bilirubin Urine: NEGATIVE
Glucose, UA: NEGATIVE mg/dL
Ketones, ur: NEGATIVE mg/dL
Leukocytes, UA: NEGATIVE
Nitrite: NEGATIVE
Protein, ur: NEGATIVE mg/dL
Specific Gravity, Urine: 1.027 (ref 1.005–1.030)
pH: 5 (ref 5.0–8.0)

## 2018-06-08 LAB — LIPASE, BLOOD: Lipase: 36 U/L (ref 11–51)

## 2018-06-08 MED ORDER — METOCLOPRAMIDE HCL 5 MG/ML IJ SOLN
10.0000 mg | Freq: Once | INTRAMUSCULAR | Status: AC
  Administered 2018-06-08: 10 mg via INTRAVENOUS
  Filled 2018-06-08: qty 2

## 2018-06-08 MED ORDER — ALBUTEROL SULFATE HFA 108 (90 BASE) MCG/ACT IN AERS: 2.0000 | INHALATION_SPRAY | Freq: Four times a day (QID) | RESPIRATORY_TRACT | 0 refills | Status: AC | PRN

## 2018-06-08 MED ORDER — METOCLOPRAMIDE HCL 10 MG PO TABS: 10.0000 mg | ORAL_TABLET | Freq: Three times a day (TID) | ORAL | 0 refills | Status: DC | PRN

## 2018-06-08 MED ORDER — SODIUM CHLORIDE 0.9 % IV BOLUS
1000.0000 mL | Freq: Once | INTRAVENOUS | Status: AC
  Administered 2018-06-08: 1000 mL via INTRAVENOUS

## 2018-06-08 MED ORDER — GUAIFENESIN ER 600 MG PO TB12
600.0000 mg | ORAL_TABLET | Freq: Once | ORAL | Status: AC
  Administered 2018-06-08: 600 mg via ORAL
  Filled 2018-06-08: qty 1

## 2018-06-08 MED ORDER — DEXTROMETHORPHAN POLISTIREX ER 30 MG/5ML PO SUER
30.0000 mg | Freq: Once | ORAL | Status: AC
  Administered 2018-06-08: 30 mg via ORAL
  Filled 2018-06-08: qty 5

## 2018-06-08 MED ORDER — IPRATROPIUM-ALBUTEROL 0.5-2.5 (3) MG/3ML IN SOLN
3.0000 mL | Freq: Once | RESPIRATORY_TRACT | Status: AC
  Administered 2018-06-08: 3 mL via RESPIRATORY_TRACT
  Filled 2018-06-08: qty 3

## 2018-06-08 MED ORDER — BENZONATATE 100 MG PO CAPS: 100.0000 mg | ORAL_CAPSULE | Freq: Four times a day (QID) | ORAL | 0 refills | Status: AC | PRN

## 2018-06-08 MED ORDER — DM-GUAIFENESIN ER 30-600 MG PO TB12: 1.0000 | ORAL_TABLET | Freq: Once | ORAL | Status: DC

## 2018-06-08 MED ORDER — ACETAMINOPHEN 500 MG PO TABS
1000.0000 mg | ORAL_TABLET | Freq: Once | ORAL | Status: AC
  Administered 2018-06-08: 1000 mg via ORAL
  Filled 2018-06-08: qty 2

## 2018-06-08 NOTE — ED Provider Notes (Signed)
[facility] Emergency Department Provider Note   ____________________________________________   I have reviewed the triage vital signs and the nursing notes.   HISTORY  Chief Complaint Cough   History limited by: Not Limited   HPI Diane Warner is a 53 y.o. female who presents to the emergency department today with multiple complaints. Complaints include fever, cough, body aches, nausea and vomiting. Patient states her symptoms started roughly 3 days ago. They have been fairly constant since then. The patient states she has had multiple sick contacts who have been diagnosed with the flu. The patient states she does have history of asthma but has not been using any breathing treatments.   Per medical record review patient has a history of gerd, asthma.  Past Medical History:  Diagnosis Date  . Anxiety   . Arthritis   . Asthma   . Chronic right hip pain   . Degenerative disc disease, thoracic   . GERD (gastroesophageal reflux disease)   . Hypertension   . Irritable bowel syndrome (IBS)   . Scoliosis     Patient Active Problem List   Diagnosis Date Noted  . Gastroenteritis 08/31/2017  . Intractable vomiting with nausea 08/29/2017  . Anxiety 04/11/2016  . Arthritis 04/11/2016  . Asthma 04/11/2016  . Chronic right hip pain 04/11/2016  . Degenerative disc disease, thoracic 04/11/2016  . GERD (gastroesophageal reflux disease) 04/11/2016  . Hypertension 04/11/2016  . Irritable bowel syndrome (IBS) 04/11/2016  . Scoliosis 04/11/2016  . Chronic pain syndrome 04/11/2016  . Chronic bilateral low back pain without sciatica 04/11/2016  . Long term prescription benzodiazepine use 04/11/2016  . Encounter for opiate analgesic use agreement 04/11/2016  . Long term current use of opiate analgesic 04/11/2016  . Long term prescription opiate use 04/11/2016  . Opiate use 04/11/2016  . Obesity, Class II, BMI 35-39.9 04/11/2016    Past Surgical History:   Procedure Laterality Date  . ABDOMINAL HYSTERECTOMY    . CESAREAN SECTION  1985  . CHOLECYSTECTOMY    . TUBAL LIGATION      Prior to Admission medications   Medication Sig Start Date End Date Taking? Authorizing Provider  baclofen (LIORESAL) 10 MG tablet Take 1 tablet (10 mg total) by mouth daily. 10/23/17 10/23/18  Fisher, Roselyn BeringSusan W, PA-C  cyclobenzaprine (FLEXERIL) 10 MG tablet Take 1 tablet (10 mg total) by mouth 3 (three) times daily as needed. 05/28/18   Fisher, Roselyn BeringSusan W, PA-C  predniSONE (STERAPRED UNI-PAK 21 TAB) 10 MG (21) TBPK tablet Take 6 pills on day one then decrease by 1 pill each day 05/28/18   Faythe GheeFisher, Susan W, PA-C  traMADol (ULTRAM) 50 MG tablet Take 1 tablet (50 mg total) by mouth every 6 (six) hours as needed. 05/28/18   Sherrie MustacheFisher, Roselyn BeringSusan W, PA-C    Allergies Zofran Frazier Richards[ondansetron hcl]; Amoxicillin; Aspirin; Compazine; Dilaudid [hydromorphone hcl]; Penicillins; and Proton pump inhibitors  No family history on file.  Social History Social History   Tobacco Use  . Smoking status: Former Smoker    Last attempt to quit: 02/16/2014    Years since quitting: 4.3  . Smokeless tobacco: Never Used  Substance Use Topics  . Alcohol use: No  . Drug use: No    Review of Systems Constitutional: Positive for fevers.  Eyes: No visual changes. ENT: No sore throat. Cardiovascular: Denies chest pain. Respiratory: Positive for cough. Gastrointestinal: Positive for nausea and diarrhea.  Genitourinary: Negative for dysuria. Musculoskeletal: Positive for muscle aches.  Skin: Negative for  rash. Neurological: Positive for headache.  ____________________________________________   PHYSICAL EXAM:  VITAL SIGNS: ED Triage Vitals  Enc Vitals Group     BP 06/08/18 1256 (!) 163/91     Pulse Rate 06/08/18 1256 98     Resp 06/08/18 1256 18     Temp 06/08/18 1256 98.1 F (36.7 C)     Temp Source 06/08/18 1256 Oral     SpO2 06/08/18 1256 98 %     Weight 06/08/18 1258 230 lb (104.3 kg)      Height 06/08/18 1258 5\' 4"  (1.626 m)     Head Circumference --      Peak Flow --      Pain Score 06/08/18 1313 9    Constitutional: Alert and oriented.  Eyes: Conjunctivae are normal.  ENT      Head: Normocephalic and atraumatic.      Nose: No congestion/rhinnorhea.      Mouth/Throat: Mucous membranes are moist.      Neck: No stridor. Hematological/Lymphatic/Immunilogical: No cervical lymphadenopathy. Cardiovascular: Normal rate, regular rhythm.  No murmurs, rubs, or gallops.  Respiratory: Normal respiratory effort without tachypnea nor retractions. Breath sounds are clear and equal bilaterally. No wheezes/rales/rhonchi. Gastrointestinal: Soft and non tender. No rebound. No guarding.  Genitourinary: Deferred Musculoskeletal: Normal range of motion in all extremities. No lower extremity edema. Neurologic:  Normal speech and language. No gross focal neurologic deficits are appreciated.  Skin:  Skin is warm, dry and intact. No rash noted. Psychiatric: Mood and affect are normal. Speech and behavior are normal. Patient exhibits appropriate insight and judgment.  ____________________________________________    LABS (pertinent positives/negatives)  Lipase 36 CMP wnl except glu 130, ca 8.4 CBC wbc 11.4, hgb 14.9, plt 302 UA hazy, moderate hgb dipstick, 0-5 rbc and wbc ____________________________________________   EKG  None  ____________________________________________    RADIOLOGY  CXR No acute disease  ____________________________________________   PROCEDURES  Procedures  ____________________________________________   INITIAL IMPRESSION / ASSESSMENT AND PLAN / ED COURSE  Pertinent labs & imaging results that were available during my care of the patient were reviewed by me and considered in my medical decision making (see chart for details).   Patient presented to the emergency department today with multiple medical complaints, however all were consistent with  viral upper respiratory infection.  Given that she has had recent flu contact I do think influenza likely.  Discussed with patient however that she would be outside of treatment the time for Tamiflu.  Patient was given IV fluids and medications here in the emergency department he did feel improvement.  Will discharge with further medications.  ____________________________________________   FINAL CLINICAL IMPRESSION(S) / ED DIAGNOSES  Final diagnoses:  Viral URI with cough     Note: This dictation was prepared with Dragon dictation. Any transcriptional errors that result from this process are unintentional     Phineas Semen, MD 06/08/18 1818

## 2018-06-08 NOTE — ED Triage Notes (Signed)
Pt is here for cough x3 days.  Pt sounds hoarse and has frequent coughing in triage.  Pt states that she has body aches and severe headache (neck supple) with this and feels generally weak.  Pt states that she has taken OTC cough medicine without relief.  Pt reports soreness with coughing and 3 episodes of diarrhea this am and stomach cramps since yesterday.

## 2018-06-08 NOTE — Discharge Instructions (Addendum)
Please seek medical attention for any high fevers, chest pain, shortness of breath, change in behavior, persistent vomiting, bloody stool or any other new or concerning symptoms.  

## 2018-08-01 ENCOUNTER — Emergency Department
Admission: EM | Admit: 2018-08-01 | Discharge: 2018-08-01 | Disposition: A | Discharge status: Home / Self Care | Payer: Self-pay | Attending: Emergency Medicine | Admitting: Emergency Medicine

## 2018-08-01 ENCOUNTER — Encounter: Payer: Self-pay | Admitting: *Deleted

## 2018-08-01 ENCOUNTER — Other Ambulatory Visit: Payer: Self-pay

## 2018-08-01 DIAGNOSIS — R600 Localized edema: Secondary | ICD-10-CM | POA: Insufficient documentation

## 2018-08-01 DIAGNOSIS — M5489 Other dorsalgia: Secondary | ICD-10-CM | POA: Insufficient documentation

## 2018-08-01 DIAGNOSIS — J45909 Unspecified asthma, uncomplicated: Secondary | ICD-10-CM | POA: Insufficient documentation

## 2018-08-01 DIAGNOSIS — Z79899 Other long term (current) drug therapy: Secondary | ICD-10-CM | POA: Insufficient documentation

## 2018-08-01 DIAGNOSIS — N3001 Acute cystitis with hematuria: Secondary | ICD-10-CM | POA: Insufficient documentation

## 2018-08-01 DIAGNOSIS — I10 Essential (primary) hypertension: Secondary | ICD-10-CM | POA: Insufficient documentation

## 2018-08-01 DIAGNOSIS — Z87891 Personal history of nicotine dependence: Secondary | ICD-10-CM | POA: Insufficient documentation

## 2018-08-01 LAB — URINALYSIS, COMPLETE (UACMP) WITH MICROSCOPIC
BILIRUBIN URINE: NEGATIVE
Bacteria, UA: NONE SEEN
Glucose, UA: NEGATIVE mg/dL
KETONES UR: NEGATIVE mg/dL
Nitrite: NEGATIVE
Protein, ur: NEGATIVE mg/dL
SPECIFIC GRAVITY, URINE: 1.025 (ref 1.005–1.030)
WBC, UA: >50 WBC/hpf — ABNORMAL HIGH (ref 0–5)
pH: 5 (ref 5.0–8.0)

## 2018-08-01 LAB — CBC WITH DIFFERENTIAL/PLATELET
Abs Immature Granulocytes: 0.08 10*3/uL — ABNORMAL HIGH (ref 0.00–0.07)
Basophils Absolute: 0.1 10*3/uL (ref 0.0–0.1)
Basophils Relative: 1 %
Eosinophils Absolute: 0.4 10*3/uL (ref 0.0–0.5)
Eosinophils Relative: 4 %
HEMATOCRIT: 48.9 % — AB (ref 36.0–46.0)
Hemoglobin: 15.5 g/dL — ABNORMAL HIGH (ref 12.0–15.0)
Immature Granulocytes: 1 %
LYMPHS ABS: 2.1 10*3/uL (ref 0.7–4.0)
Lymphocytes Relative: 20 %
MCH: 30.1 pg (ref 26.0–34.0)
MCHC: 31.7 g/dL (ref 30.0–36.0)
MCV: 95 fL (ref 80.0–100.0)
MONOS PCT: 8 %
Monocytes Absolute: 0.8 10*3/uL (ref 0.1–1.0)
Neutro Abs: 7.1 10*3/uL (ref 1.7–7.7)
Neutrophils Relative %: 66 %
Platelets: 243 10*3/uL (ref 150–400)
RBC: 5.15 MIL/uL — ABNORMAL HIGH (ref 3.87–5.11)
RDW: 14.1 % (ref 11.5–15.5)
WBC: 10.6 10*3/uL — ABNORMAL HIGH (ref 4.0–10.5)
nRBC: 0 % (ref 0.0–0.2)

## 2018-08-01 LAB — COMPREHENSIVE METABOLIC PANEL
ALBUMIN: 3.2 g/dL — AB (ref 3.5–5.0)
ALT: 8 U/L (ref 0–44)
AST: 19 U/L (ref 15–41)
Alkaline Phosphatase: 53 U/L (ref 38–126)
Anion gap: 6 (ref 5–15)
BILIRUBIN TOTAL: 0.2 mg/dL — AB (ref 0.3–1.2)
BUN: 10 mg/dL (ref 6–20)
CO2: 25 mmol/L (ref 22–32)
Calcium: 8 mg/dL — ABNORMAL LOW (ref 8.9–10.3)
Chloride: 109 mmol/L (ref 98–111)
Creatinine, Ser: 0.75 mg/dL (ref 0.44–1.00)
GFR calc Af Amer: >60 mL/min (ref >60)
GFR calc non Af Amer: >60 mL/min (ref >60)
GLUCOSE: 110 mg/dL — AB (ref 70–99)
Potassium: 3.7 mmol/L (ref 3.5–5.1)
Sodium: 140 mmol/L (ref 135–145)
Total Protein: 5.7 g/dL — ABNORMAL LOW (ref 6.5–8.1)

## 2018-08-01 LAB — BRAIN NATRIURETIC PEPTIDE: B Natriuretic Peptide: 49 pg/mL (ref 0.0–100.0)

## 2018-08-01 MED ORDER — MELOXICAM 7.5 MG PO TABS
15.0000 mg | ORAL_TABLET | Freq: Once | ORAL | Status: AC
  Administered 2018-08-01: 15 mg via ORAL
  Filled 2018-08-01: qty 2

## 2018-08-01 MED ORDER — FUROSEMIDE 40 MG PO TABS
20.0000 mg | ORAL_TABLET | Freq: Once | ORAL | Status: AC
  Administered 2018-08-01: 20 mg via ORAL
  Filled 2018-08-01: qty 1

## 2018-08-01 MED ORDER — AMLODIPINE BESYLATE 5 MG PO TABS
5.0000 mg | ORAL_TABLET | Freq: Once | ORAL | Status: AC
  Administered 2018-08-01: 5 mg via ORAL
  Filled 2018-08-01: qty 1

## 2018-08-01 MED ORDER — LOSARTAN POTASSIUM 50 MG PO TABS
25.0000 mg | ORAL_TABLET | Freq: Once | ORAL | Status: AC
  Administered 2018-08-01: 25 mg via ORAL
  Filled 2018-08-01: qty 1

## 2018-08-01 MED ORDER — SULFAMETHOXAZOLE-TRIMETHOPRIM 800-160 MG PO TABS
1.0000 | ORAL_TABLET | Freq: Once | ORAL | Status: AC
  Administered 2018-08-01: 1 via ORAL
  Filled 2018-08-01: qty 1

## 2018-08-01 MED ORDER — LOSARTAN POTASSIUM 25 MG PO TABS: 25.0000 mg | ORAL_TABLET | Freq: Every day | ORAL | 6 refills | Status: AC

## 2018-08-01 MED ORDER — SULFAMETHOXAZOLE-TRIMETHOPRIM 800-160 MG PO TABS: 1.0000 | ORAL_TABLET | Freq: Two times a day (BID) | ORAL | 0 refills | Status: DC

## 2018-08-01 MED ORDER — AMLODIPINE BESYLATE 5 MG PO TABS: 5.0000 mg | ORAL_TABLET | Freq: Every day | ORAL | 6 refills | Status: AC

## 2018-08-01 MED ORDER — FUROSEMIDE 20 MG PO TABS: 20.0000 mg | ORAL_TABLET | Freq: Every day | ORAL | 6 refills | Status: AC

## 2018-08-01 MED ORDER — MELOXICAM 15 MG PO TABS: 15.0000 mg | ORAL_TABLET | Freq: Every day | ORAL | 0 refills | Status: DC

## 2018-08-01 NOTE — ED Provider Notes (Signed)
[facility] Emergency Department Provider Note  ____________________________________________  Time seen: Approximately 3:45 PM  I have reviewed the triage vital signs and the nursing notes.   HISTORY  Chief Complaint Dysuria and Back Pain    HPI Diane Warner is a 53 y.o. female who presents the emergency department for multiple complaints.  Patient has had 2 weeks of dysuria, UTI symptoms.  Patient does have some lower quadrant/lower back pain.  Patient reports that she was treating symptomatically but symptoms have persisted.  She denies any vaginal discharge or bleeding.  She endorses possible hematuria but no frank hematuria.  Patient is also concerned as she has had 10 pounds of weight gain over the past week.  She has no history of CHF but has been on losartan, amlodipine, Lasix for hypertension.  She states that she does not have insurance and wants her medications ran out she has not followed up for refills.  Patient has again no history of CHF, chronic kidney disease.  Patient denies any fevers or chills, nausea vomiting, diarrhea or constipation.  No history of nephrolithiasis  Patient does have a history of anxiety, asthma, degenerative disc disease, GERD, hypertension, chronic pain syndrome.         Past Medical History:  Diagnosis Date  . Anxiety   . Arthritis   . Asthma   . Chronic right hip pain   . Degenerative disc disease, thoracic   . GERD (gastroesophageal reflux disease)   . Hypertension   . Irritable bowel syndrome (IBS)   . Scoliosis     Patient Active Problem List   Diagnosis Date Noted  . Gastroenteritis 08/31/2017  . Intractable vomiting with nausea 08/29/2017  . Anxiety 04/11/2016  . Arthritis 04/11/2016  . Asthma 04/11/2016  . Chronic right hip pain 04/11/2016  . Degenerative disc disease, thoracic 04/11/2016  . GERD (gastroesophageal reflux disease) 04/11/2016  . Hypertension 04/11/2016  . Irritable bowel  syndrome (IBS) 04/11/2016  . Scoliosis 04/11/2016  . Chronic pain syndrome 04/11/2016  . Chronic bilateral low back pain without sciatica 04/11/2016  . Long term prescription benzodiazepine use 04/11/2016  . Encounter for opiate analgesic use agreement 04/11/2016  . Long term current use of opiate analgesic 04/11/2016  . Long term prescription opiate use 04/11/2016  . Opiate use 04/11/2016  . Obesity, Class II, BMI 35-39.9 04/11/2016    Past Surgical History:  Procedure Laterality Date  . ABDOMINAL HYSTERECTOMY    . CESAREAN SECTION  1985  . CHOLECYSTECTOMY    . TUBAL LIGATION      Prior to Admission medications   Medication Sig Start Date End Date Taking? Authorizing Provider  albuterol (PROVENTIL HFA;VENTOLIN HFA) 108 (90 Base) MCG/ACT inhaler Inhale 2 puffs into the lungs every 6 (six) hours as needed for wheezing or shortness of breath. 06/08/18   Phineas Semen, MD  amLODipine (NORVASC) 5 MG tablet Take 1 tablet (5 mg total) by mouth daily. 08/01/18 08/01/19  Jaideep Pollack, Delorise Royals, PA-C  baclofen (LIORESAL) 10 MG tablet Take 1 tablet (10 mg total) by mouth daily. 10/23/17 10/23/18  Fisher, Roselyn Bering, PA-C  benzonatate (TESSALON PERLES) 100 MG capsule Take 1 capsule (100 mg total) by mouth every 6 (six) hours as needed for cough. 06/08/18 06/08/19  Phineas Semen, MD  cyclobenzaprine (FLEXERIL) 10 MG tablet Take 1 tablet (10 mg total) by mouth 3 (three) times daily as needed. 05/28/18   Fisher, Roselyn Bering, PA-C  furosemide (LASIX) 20 MG tablet Take 1 tablet (20  mg total) by mouth daily. 08/01/18 08/01/19  Nandita Mathenia, Delorise Royals, PA-C  losartan (COZAAR) 25 MG tablet Take 1 tablet (25 mg total) by mouth daily. 08/01/18 08/01/19  Kimberlly Norgard, Delorise Royals, PA-C  meloxicam (MOBIC) 15 MG tablet Take 1 tablet (15 mg total) by mouth daily. 08/01/18   Ichiro Chesnut, Delorise Royals, PA-C  metoCLOPramide (REGLAN) 10 MG tablet Take 1 tablet (10 mg total) by mouth every 8 (eight) hours as needed for nausea or vomiting.  06/08/18 06/08/19  Phineas Semen, MD  predniSONE (STERAPRED UNI-PAK 21 TAB) 10 MG (21) TBPK tablet Take 6 pills on day one then decrease by 1 pill each day 05/28/18   Faythe Ghee, PA-C  sulfamethoxazole-trimethoprim (BACTRIM DS,SEPTRA DS) 800-160 MG tablet Take 1 tablet by mouth 2 (two) times daily. 08/01/18   Abie Cheek, Delorise Royals, PA-C  traMADol (ULTRAM) 50 MG tablet Take 1 tablet (50 mg total) by mouth every 6 (six) hours as needed. 05/28/18   Sherrie Mustache Roselyn Bering, PA-C    Allergies Zofran Frazier Richards hcl]; Amoxicillin; Aspirin; Compazine; Dilaudid [hydromorphone hcl]; Penicillins; and Proton pump inhibitors  No family history on file.  Social History Social History   Tobacco Use  . Smoking status: Former Smoker    Last attempt to quit: 02/16/2014    Years since quitting: 4.4  . Smokeless tobacco: Never Used  Substance Use Topics  . Alcohol use: No  . Drug use: No     Review of Systems  Constitutional: No fever/chills.  Positive for unintentional weight gain. Eyes: No visual changes. No discharge ENT: No upper respiratory complaints. Cardiovascular: no chest pain. Respiratory: no cough. No SOB. Gastrointestinal: No abdominal pain.  No nausea, no vomiting.  No diarrhea.  No constipation. Genitourinary: Positive for dysuria and polyuria.  Possible hematuria Musculoskeletal: Negative for musculoskeletal pain. Skin: Negative for rash, abrasions, lacerations, ecchymosis. Neurological: Negative for headaches, focal weakness or numbness. 10-point ROS otherwise negative.  ____________________________________________   PHYSICAL EXAM:  VITAL SIGNS: ED Triage Vitals  Enc Vitals Group     BP 08/01/18 1517 128/81     Pulse Rate 08/01/18 1517 80     Resp 08/01/18 1517 20     Temp 08/01/18 1517 99.3 F (37.4 C)     Temp Source 08/01/18 1517 Oral     SpO2 08/01/18 1517 96 %     Weight 08/01/18 1518 235 lb (106.6 kg)     Height 08/01/18 1518  (1.626 m)     Head  Circumference --      Peak Flow --      Pain Score 08/01/18 1518 7     Pain Loc --      Pain Edu? --      Excl. in GC? --      Constitutional: Alert and oriented. Well appearing and in no acute distress. Eyes: Conjunctivae are normal. PERRL. EOMI. Head: Atraumatic. ENT:      Ears:       Nose: No congestion/rhinnorhea.      Mouth/Throat: Mucous membranes are moist.  Neck: No stridor.  No cervical spine tenderness to palpation. Hematological/Lymphatic/Immunilogical: No cervical lymphadenopathy.* Cardiovascular: Normal rate, regular rhythm. Normal S1 and S2.  Good peripheral circulation.  Patient does have mild bilateral lower edema.  This is nonpitting. Respiratory: Normal respiratory effort without tachypnea or retractions. Lungs CTAB. Good air entry to the bases with no decreased or absent breath sounds. Gastrointestinal: No visible abnormalities to abdominal wall.  Bowel sounds 4 quadrants. Soft and nontender to palpation.  No guarding or rigidity. No palpable masses. No distention. No CVA tenderness. Musculoskeletal: Full range of motion to all extremities. No gross deformities appreciated. Neurologic:  Normal speech and language. No gross focal neurologic deficits are appreciated.  Skin:  Skin is warm, dry and intact. No rash noted. Psychiatric: Mood and affect are normal. Speech and behavior are normal. Patient exhibits appropriate insight and judgement.   ____________________________________________   LABS (all labs ordered are listed, but only abnormal results are displayed)  Labs Reviewed  URINALYSIS, COMPLETE (UACMP) WITH MICROSCOPIC - Abnormal; Notable for the following components:      Result Value   Color, Urine AMBER (*)    APPearance CLEAR (*)    Hgb urine dipstick SMALL (*)    Leukocytes,Ua MODERATE (*)    WBC, UA >50 (*)    Non Squamous Epithelial PRESENT (*)    All other components within normal limits  CBC WITH DIFFERENTIAL/PLATELET - Abnormal; Notable for  the following components:   WBC 10.6 (*)    RBC 5.15 (*)    Hemoglobin 15.5 (*)    HCT 48.9 (*)    Abs Immature Granulocytes 0.08 (*)    All other components within normal limits  COMPREHENSIVE METABOLIC PANEL - Abnormal; Notable for the following components:   Glucose, Bld 110 (*)    Calcium 8.0 (*)    Total Protein 5.7 (*)    Albumin 3.2 (*)    Total Bilirubin 0.2 (*)    All other components within normal limits  URINE CULTURE  BRAIN NATRIURETIC PEPTIDE   ____________________________________________  EKG   ____________________________________________  RADIOLOGY   No results found.  ____________________________________________    PROCEDURES  Procedure(s) performed:    Procedures    Medications  meloxicam (MOBIC) tablet 15 mg (has no administration in time range)  sulfamethoxazole-trimethoprim (BACTRIM DS,SEPTRA DS) 800-160 MG per tablet 1 tablet (has no administration in time range)  furosemide (LASIX) tablet 20 mg (has no administration in time range)  amLODipine (NORVASC) tablet 5 mg (has no administration in time range)  losartan (COZAAR) tablet 25 mg (has no administration in time range)     ____________________________________________   INITIAL IMPRESSION / ASSESSMENT AND PLAN / ED COURSE  Pertinent labs & imaging results that were available during my care of the patient were reviewed by me and considered in my medical decision making (see chart for details).  Review of the [place] CSRS was performed in accordance of the [facility] prior to dispensing any controlled drugs.  Clinical Course as of Jul 31 1837  Wed Aug 01, 2018  1557 Patient presents emergency department with multiple complaints.  Patient's primary complaint was dysuria, polyuria, possible hematuria and lower back pain.  Patient also was requesting medication refills of her hypertension medications.  Patient also endorsed weight gain over the past week.  On exam, no indication of pyelonephritis.   Patient has no CVA tenderness.  No abdominal tenderness to palpation.  Patient does have mild bilateral lower edema but this is nonpitting in nature.  No adventitious lung sounds.  No murmurs, rubs or gallops.  Patient was on Lasix for hypertension but is not been on her hypertensive medications.  Given symptoms, likely water retention.  No history of CHF.  Urinalysis returns with white blood cells without nitrates or bacteria.  Given 2-week history of symptoms however I will treat the patient for UTI and send urine culture for further investigation.  Based off the patient's other complaints, basic labs will be obtained  at this time.   [JC]    Clinical Course User Index [JC] Gennifer Potenza, Delorise Royals, PA-C          Patient's diagnosis is consistent with cystitis, hypertension.  Patient presented to the emergency department with UTI-like symptoms for the past 2 weeks.  Patient was also complaining of water retention/weight gain and needing refill of her medications.  Basic labs returned with reassuring results.  BNP was within normal limits.  I suspect that symptoms are most likely cystitis without true bacteria, however with 2-week history I will treat the patient with antibiotics.  Patient has a refill of her medication including her Lasix which I suspect will help with her water retention and weight gain.  At this time, patient has no other symptoms, exam was reassuring.  No indication for further work-up at this time..  Patient is to follow-up with primary care.  Patient is given ED precautions to return to the ED for any worsening or new symptoms.     ____________________________________________  FINAL CLINICAL IMPRESSION(S) / ED DIAGNOSES  Final diagnoses:  Acute cystitis with hematuria  Essential hypertension      NEW MEDICATIONS STARTED DURING THIS VISIT:  ED Discharge Orders         Ordered    furosemide (LASIX) 20 MG tablet  Daily     08/01/18 1834    amLODipine (NORVASC) 5 MG  tablet  Daily     08/01/18 1834    losartan (COZAAR) 25 MG tablet  Daily     08/01/18 1834    sulfamethoxazole-trimethoprim (BACTRIM DS,SEPTRA DS) 800-160 MG tablet  2 times daily     08/01/18 1834    meloxicam (MOBIC) 15 MG tablet  Daily     08/01/18 1834              This chart was dictated using voice recognition software/Dragon. Despite best efforts to proofread, errors can occur which can change the meaning. Any change was purely unintentional.    Racheal Patches, PA-C 08/01/18 1839    Phineas Semen, MD 08/01/18 2010

## 2018-08-01 NOTE — ED Triage Notes (Signed)
Pt ambulatory to triage.  Pt has low back pain and dysuria. No n/v/d.  Hx uti's.  No hx kidney stones.  Pt alert   Speech clear.

## 2018-08-01 NOTE — ED Notes (Signed)
Pain to lower left back

## 2018-08-03 LAB — URINE CULTURE
Culture: >=100000 — AB
Special Requests: NORMAL

## 2018-12-26 ENCOUNTER — Other Ambulatory Visit: Payer: Self-pay

## 2018-12-26 ENCOUNTER — Emergency Department
Admission: EM | Admit: 2018-12-26 | Discharge: 2018-12-26 | Disposition: A | Discharge status: Home / Self Care | Payer: Self-pay | Attending: Emergency Medicine | Admitting: Emergency Medicine

## 2018-12-26 ENCOUNTER — Encounter: Payer: Self-pay | Admitting: Emergency Medicine

## 2018-12-26 DIAGNOSIS — I10 Essential (primary) hypertension: Secondary | ICD-10-CM | POA: Insufficient documentation

## 2018-12-26 DIAGNOSIS — M5440 Lumbago with sciatica, unspecified side: Secondary | ICD-10-CM | POA: Insufficient documentation

## 2018-12-26 DIAGNOSIS — J45909 Unspecified asthma, uncomplicated: Secondary | ICD-10-CM | POA: Insufficient documentation

## 2018-12-26 DIAGNOSIS — Z79899 Other long term (current) drug therapy: Secondary | ICD-10-CM | POA: Insufficient documentation

## 2018-12-26 DIAGNOSIS — Z87891 Personal history of nicotine dependence: Secondary | ICD-10-CM | POA: Insufficient documentation

## 2018-12-26 LAB — URINALYSIS, COMPLETE (UACMP) WITH MICROSCOPIC
Bacteria, UA: NONE SEEN
Bilirubin Urine: NEGATIVE
Glucose, UA: NEGATIVE mg/dL
Ketones, ur: NEGATIVE mg/dL
Leukocytes,Ua: NEGATIVE
Nitrite: NEGATIVE
Protein, ur: NEGATIVE mg/dL
Specific Gravity, Urine: 1.017 (ref 1.005–1.030)
pH: 5 (ref 5.0–8.0)

## 2018-12-26 MED ORDER — METHOCARBAMOL 500 MG PO TABS: 500.0000 mg | ORAL_TABLET | Freq: Three times a day (TID) | ORAL | 0 refills | Status: AC | PRN

## 2018-12-26 MED ORDER — PREDNISONE 10 MG (21) PO TBPK: ORAL_TABLET | ORAL | 0 refills | Status: DC

## 2018-12-26 MED ORDER — OXYCODONE-ACETAMINOPHEN 5-325 MG PO TABS
1.0000 | ORAL_TABLET | Freq: Once | ORAL | Status: AC
  Administered 2018-12-26: 1 via ORAL
  Filled 2018-12-26: qty 1

## 2018-12-26 MED ORDER — PREDNISONE 20 MG PO TABS
60.0000 mg | ORAL_TABLET | Freq: Once | ORAL | Status: AC
  Administered 2018-12-26: 60 mg via ORAL
  Filled 2018-12-26: qty 3

## 2018-12-26 MED ORDER — MORPHINE SULFATE (PF) 4 MG/ML IV SOLN
4.0000 mg | Freq: Once | INTRAVENOUS | Status: AC
  Administered 2018-12-26: 4 mg via INTRAMUSCULAR
  Filled 2018-12-26: qty 1

## 2018-12-26 MED ORDER — KETOROLAC TROMETHAMINE 30 MG/ML IJ SOLN
30.0000 mg | Freq: Once | INTRAMUSCULAR | Status: AC
  Administered 2018-12-26: 30 mg via INTRAMUSCULAR
  Filled 2018-12-26: qty 1

## 2018-12-26 NOTE — ED Provider Notes (Signed)
[facility] Emergency Department Provider Note  ____________________________________________  Time seen: Approximately 10:08 PM  I have reviewed the triage vital signs and the nursing notes.   HISTORY  Chief Complaint Back Pain    HPI Diane Warner is a 53 y.o. female presents to the emergency department with low back pain for the past 4 days that radiates into the bilateral lower extremities.  Patient states that she has had similar pain in the past but it has been particularly bad over the past 4 days.  Patient states that she has been able to ambulate with some difficulty.  No bowel or bladder incontinence or saddle anesthesia.  Patient denies falls or mechanisms of trauma.  Laying supine seems to make pain better.  Ambulating or standing for a prolonged period of time seems to make pain worse.  She denies dysuria, hematuria or increased urinary frequency.  No other alleviating measures have been attempted.       Past Medical History:  Diagnosis Date  . Anxiety   . Arthritis   . Asthma   . Chronic right hip pain   . Degenerative disc disease, thoracic   . GERD (gastroesophageal reflux disease)   . Hypertension   . Irritable bowel syndrome (IBS)   . Scoliosis     Patient Active Problem List   Diagnosis Date Noted  . Gastroenteritis 08/31/2017  . Intractable vomiting with nausea 08/29/2017  . Anxiety 04/11/2016  . Arthritis 04/11/2016  . Asthma 04/11/2016  . Chronic right hip pain 04/11/2016  . Degenerative disc disease, thoracic 04/11/2016  . GERD (gastroesophageal reflux disease) 04/11/2016  . Hypertension 04/11/2016  . Irritable bowel syndrome (IBS) 04/11/2016  . Scoliosis 04/11/2016  . Chronic pain syndrome 04/11/2016  . Chronic bilateral low back pain without sciatica 04/11/2016  . Long term prescription benzodiazepine use 04/11/2016  . Encounter for opiate analgesic use agreement 04/11/2016  . Long term current use of opiate  analgesic 04/11/2016  . Long term prescription opiate use 04/11/2016  . Opiate use 04/11/2016  . Obesity, Class II, BMI 35-39.9 04/11/2016    Past Surgical History:  Procedure Laterality Date  . ABDOMINAL HYSTERECTOMY    . CESAREAN SECTION  1985  . CHOLECYSTECTOMY    . TUBAL LIGATION      Prior to Admission medications   Medication Sig Start Date End Date Taking? Authorizing Provider  albuterol (PROVENTIL HFA;VENTOLIN HFA) 108 (90 Base) MCG/ACT inhaler Inhale 2 puffs into the lungs every 6 (six) hours as needed for wheezing or shortness of breath. 06/08/18   Nance Pear, MD  amLODipine (NORVASC) 5 MG tablet Take 1 tablet (5 mg total) by mouth daily. 08/01/18 08/01/19  Cuthriell, Charline Bills, PA-C  benzonatate (TESSALON PERLES) 100 MG capsule Take 1 capsule (100 mg total) by mouth every 6 (six) hours as needed for cough. 06/08/18 06/08/19  Nance Pear, MD  cyclobenzaprine (FLEXERIL) 10 MG tablet Take 1 tablet (10 mg total) by mouth 3 (three) times daily as needed. 05/28/18   Fisher, Linden Dolin, PA-C  furosemide (LASIX) 20 MG tablet Take 1 tablet (20 mg total) by mouth daily. 08/01/18 08/01/19  Cuthriell, Charline Bills, PA-C  losartan (COZAAR) 25 MG tablet Take 1 tablet (25 mg total) by mouth daily. 08/01/18 08/01/19  Cuthriell, Charline Bills, PA-C  meloxicam (MOBIC) 15 MG tablet Take 1 tablet (15 mg total) by mouth daily. 08/01/18   Cuthriell, Charline Bills, PA-C  methocarbamol (ROBAXIN) 500 MG tablet Take 1 tablet (500 mg total) by  mouth every 8 (eight) hours as needed for up to 5 days. 12/26/18 12/31/18  Orvil FeilWoods, Sala Tague M, PA-C  metoCLOPramide (REGLAN) 10 MG tablet Take 1 tablet (10 mg total) by mouth every 8 (eight) hours as needed for nausea or vomiting. 06/08/18 06/08/19  Phineas SemenGoodman, Graydon, MD  predniSONE (STERAPRED UNI-PAK 21 TAB) 10 MG (21) TBPK tablet Take 60 mg the first two days. Decrease by 10 mg every two days until medications run out. 12/26/18   Orvil FeilWoods, Tahiry Spicer M, PA-C  sulfamethoxazole-trimethoprim  (BACTRIM DS,SEPTRA DS) 800-160 MG tablet Take 1 tablet by mouth 2 (two) times daily. 08/01/18   Cuthriell, Delorise RoyalsJonathan D, PA-C  traMADol (ULTRAM) 50 MG tablet Take 1 tablet (50 mg total) by mouth every 6 (six) hours as needed. 05/28/18   Faythe GheeFisher, Susan W, PA-C    Allergies Zofran Frazier Richards[ondansetron hcl], Amoxicillin, Aspirin, Compazine, Dilaudid [hydromorphone hcl], Penicillins, and Proton pump inhibitors  No family history on file.  Social History Social History   Tobacco Use  . Smoking status: Former Smoker    Quit date: 02/16/2014    Years since quitting: 4.8  . Smokeless tobacco: Never Used  Substance Use Topics  . Alcohol use: No  . Drug use: No     Review of Systems  Constitutional: No fever/chills Eyes: No visual changes. No discharge ENT: No upper respiratory complaints. Cardiovascular: no chest pain. Respiratory: no cough. No SOB. Gastrointestinal: No abdominal pain.  No nausea, no vomiting.  No diarrhea.  No constipation. Genitourinary: Negative for dysuria. No hematuria Musculoskeletal: Patient has low back pain.  Skin: Negative for rash, abrasions, lacerations, ecchymosis. Neurological: Negative for headaches, focal weakness or numbness.   ____________________________________________   PHYSICAL EXAM:  VITAL SIGNS: ED Triage Vitals  Enc Vitals Group     BP 12/26/18 1958 (!) 147/76     Pulse Rate 12/26/18 1958 (!) 103     Resp 12/26/18 1958 17     Temp 12/26/18 1958 98.6 F (37 C)     Temp Source 12/26/18 1958 Oral     SpO2 12/26/18 1958 99 %     Weight 12/26/18 1959 235 lb (106.6 kg)     Height 12/26/18 1959 5\' 4"  (1.626 m)     Head Circumference --      Peak Flow --      Pain Score 12/26/18 1958 9     Pain Loc --      Pain Edu? --      Excl. in GC? --      Constitutional: Alert and oriented. Well appearing and in no acute distress. Eyes: Conjunctivae are normal. PERRL. EOMI. Head: Atraumatic. Cardiovascular: Normal rate, regular rhythm. Normal S1 and  S2.  Good peripheral circulation. Respiratory: Normal respiratory effort without tachypnea or retractions. Lungs CTAB. Good air entry to the bases with no decreased or absent breath sounds. Gastrointestinal: Bowel sounds 4 quadrants. Soft and nontender to palpation. No guarding or rigidity. No palpable masses. No distention. No CVA tenderness. Musculoskeletal: Full range of motion to all extremities. No gross deformities appreciated.  Patient has 4 out of 5 strength of the left great toe and 5 out of 5 strength in the right.  She has a positive straight leg raise test on the left.  Patient has tenderness to palpation along the paraspinal muscles of the lumbar spine.  Palpable dorsalis pedis pulse bilaterally and symmetrically. Neurologic:  Normal speech and language. No gross focal neurologic deficits are appreciated.  Skin:  Skin is warm, dry and intact.  No rash noted. Psychiatric: Mood and affect are normal. Speech and behavior are normal. Patient exhibits appropriate insight and judgement.   ____________________________________________   LABS (all labs ordered are listed, but only abnormal results are displayed)  Labs Reviewed  URINALYSIS, COMPLETE (UACMP) WITH MICROSCOPIC - Abnormal; Notable for the following components:      Result Value   Color, Urine YELLOW (*)    APPearance HAZY (*)    Hgb urine dipstick SMALL (*)    All other components within normal limits   ____________________________________________  EKG   ____________________________________________  RADIOLOGY   No results found.  ____________________________________________    PROCEDURES  Procedure(s) performed:    Procedures    Medications  oxyCODONE-acetaminophen (PERCOCET/ROXICET) 5-325 MG per tablet 1 tablet (1 tablet Oral Given 12/26/18 2212)  ketorolac (TORADOL) 30 MG/ML injection 30 mg (30 mg Intramuscular Given 12/26/18 2213)  morphine 4 MG/ML injection 4 mg (4 mg Intramuscular Given 12/26/18  2303)  predniSONE (DELTASONE) tablet 60 mg (60 mg Oral Given 12/26/18 2302)     ____________________________________________   INITIAL IMPRESSION / ASSESSMENT AND PLAN / ED COURSE  Pertinent labs & imaging results that were available during my care of the patient were reviewed by me and considered in my medical decision making (see chart for details).  Review of the [place] CSRS was performed in accordance of the [facility] prior to dispensing any controlled drugs.         Assessment and plan Low back pain 53 year old female presents to the emergency department with low back pain for the past 4 days. Patient was mildly hypertensive and tachycardic in the emergency department vital signs were otherwise reassuring.  Left great toe on physical exam but neuro exam was otherwise reassuring.  She had normal sensation but no history of bowel or bladder incontinence or saddle anesthesia. Differential diagnosis includes disc extrusion, sciatica, lumbar strain, cystitis.... Urinalysis was noncontributory for cystitis.  Patient was given Percocet and Toradol in the emergency department was reassessed.  She stated that her pain was still excruciating and had manage.  Patient was given an injection of morphine and was started on prednisone.  She reports that her pain improved significantly.  She was discharged with tapered prednisone and Robaxin.  She was advised to follow-up with neurosurgery.  All patient questions were answered.     ____________________________________________  FINAL CLINICAL IMPRESSION(S) / ED DIAGNOSES  Final diagnoses:  Acute bilateral low back pain with sciatica, sciatica laterality unspecified      NEW MEDICATIONS STARTED DURING THIS VISIT:  ED Discharge Orders         Ordered    predniSONE (STERAPRED UNI-PAK 21 TAB) 10 MG (21) TBPK tablet     12/26/18 2341    methocarbamol (ROBAXIN) 500 MG tablet  Every 8 hours PRN     12/26/18 2341              This chart was  dictated using voice recognition software/Dragon. Despite best efforts to proofread, errors can occur which can change the meaning. Any change was purely unintentional.    Orvil FeilWoods, Meela Wareing M, PA-C 12/26/18 2345    Dionne BucySiadecki, Sebastian, MD 12/27/18 1539

## 2018-12-26 NOTE — ED Triage Notes (Signed)
PT states she is here due to lower back pain that has been present for the last 4 days. Pt states she has known injury to her back and brought CT/XRAYs of it with her.

## 2019-03-04 ENCOUNTER — Other Ambulatory Visit: Payer: Self-pay

## 2019-03-04 ENCOUNTER — Encounter: Payer: Self-pay | Admitting: Intensive Care

## 2019-03-04 ENCOUNTER — Emergency Department
Admission: EM | Admit: 2019-03-04 | Discharge: 2019-03-04 | Disposition: A | Discharge status: Home / Self Care | Payer: Self-pay | Attending: Emergency Medicine | Admitting: Emergency Medicine

## 2019-03-04 DIAGNOSIS — Z20828 Contact with and (suspected) exposure to other viral communicable diseases: Secondary | ICD-10-CM | POA: Insufficient documentation

## 2019-03-04 DIAGNOSIS — J45909 Unspecified asthma, uncomplicated: Secondary | ICD-10-CM | POA: Insufficient documentation

## 2019-03-04 DIAGNOSIS — R112 Nausea with vomiting, unspecified: Secondary | ICD-10-CM | POA: Insufficient documentation

## 2019-03-04 DIAGNOSIS — Z79899 Other long term (current) drug therapy: Secondary | ICD-10-CM | POA: Insufficient documentation

## 2019-03-04 DIAGNOSIS — R197 Diarrhea, unspecified: Secondary | ICD-10-CM | POA: Insufficient documentation

## 2019-03-04 DIAGNOSIS — I10 Essential (primary) hypertension: Secondary | ICD-10-CM | POA: Insufficient documentation

## 2019-03-04 DIAGNOSIS — R1111 Vomiting without nausea: Secondary | ICD-10-CM

## 2019-03-04 DIAGNOSIS — Z87891 Personal history of nicotine dependence: Secondary | ICD-10-CM | POA: Insufficient documentation

## 2019-03-04 LAB — COMPREHENSIVE METABOLIC PANEL
ALT: 10 U/L (ref 0–44)
AST: 21 U/L (ref 15–41)
Albumin: 3.3 g/dL — ABNORMAL LOW (ref 3.5–5.0)
Alkaline Phosphatase: 53 U/L (ref 38–126)
Anion gap: 8 (ref 5–15)
BUN: 10 mg/dL (ref 6–20)
CO2: 26 mmol/L (ref 22–32)
Calcium: 8.6 mg/dL — ABNORMAL LOW (ref 8.9–10.3)
Chloride: 107 mmol/L (ref 98–111)
Creatinine, Ser: 0.82 mg/dL (ref 0.44–1.00)
GFR calc Af Amer: >60 mL/min (ref >60)
GFR calc non Af Amer: >60 mL/min (ref >60)
Glucose, Bld: 107 mg/dL — ABNORMAL HIGH (ref 70–99)
Potassium: 3.2 mmol/L — ABNORMAL LOW (ref 3.5–5.1)
Sodium: 141 mmol/L (ref 135–145)
Total Bilirubin: 0.4 mg/dL (ref 0.3–1.2)
Total Protein: 6.1 g/dL — ABNORMAL LOW (ref 6.5–8.1)

## 2019-03-04 LAB — URINALYSIS, COMPLETE (UACMP) WITH MICROSCOPIC
Bilirubin Urine: NEGATIVE
Glucose, UA: NEGATIVE mg/dL
Hgb urine dipstick: NEGATIVE
Ketones, ur: NEGATIVE mg/dL
Leukocytes,Ua: NEGATIVE
Nitrite: NEGATIVE
Protein, ur: NEGATIVE mg/dL
Specific Gravity, Urine: 1.009 (ref 1.005–1.030)
pH: 5 (ref 5.0–8.0)

## 2019-03-04 LAB — CBC
HCT: 43.2 % (ref 36.0–46.0)
Hemoglobin: 13.7 g/dL (ref 12.0–15.0)
MCH: 29.7 pg (ref 26.0–34.0)
MCHC: 31.7 g/dL (ref 30.0–36.0)
MCV: 93.7 fL (ref 80.0–100.0)
Platelets: 261 10*3/uL (ref 150–400)
RBC: 4.61 MIL/uL (ref 3.87–5.11)
RDW: 13.3 % (ref 11.5–15.5)
WBC: 6.7 10*3/uL (ref 4.0–10.5)
nRBC: 0 % (ref 0.0–0.2)

## 2019-03-04 LAB — INFLUENZA PANEL BY PCR (TYPE A & B)
Influenza A By PCR: NEGATIVE
Influenza B By PCR: NEGATIVE

## 2019-03-04 LAB — LIPASE, BLOOD: Lipase: 30 U/L (ref 11–51)

## 2019-03-04 MED ORDER — SODIUM CHLORIDE 0.9 % IV BOLUS
1000.0000 mL | Freq: Once | INTRAVENOUS | Status: AC
  Administered 2019-03-04: 1000 mL via INTRAVENOUS

## 2019-03-04 MED ORDER — DICYCLOMINE HCL 10 MG PO CAPS: 10.0000 mg | ORAL_CAPSULE | Freq: Three times a day (TID) | ORAL | 0 refills | Status: DC

## 2019-03-04 MED ORDER — DICYCLOMINE HCL 10 MG/ML IM SOLN
20.0000 mg | Freq: Once | INTRAMUSCULAR | Status: AC
  Administered 2019-03-04: 20 mg via INTRAMUSCULAR
  Filled 2019-03-04: qty 2

## 2019-03-04 NOTE — ED Triage Notes (Signed)
Patient c/o body aches, fever, congestion, diarrhea, and abdominal pain X4 days ago

## 2019-03-04 NOTE — ED Provider Notes (Signed)
[facility] Emergency Department Provider Note  ____________________________________________  Time seen: Approximately 9:24 PM  I have reviewed the triage vital signs and the nursing notes.   HISTORY  Chief Complaint Abdominal Pain, Diarrhea, Fever, and Generalized Body Aches    HPI Diane Warner is a 53 y.o. female with a history of asthma, hypertension, GERD and anxiety, presents to the emergency department with fever, body aches, nasal congestion, abdominal cramping and diarrhea for the past 4 days.  She has had one episode of emesis.  Patient has had fever for the past 24 hours. No nasal congestion or nonproductive cough.  She denies chest pain, chest tightness or abdominal pain.  Patient works at a local [place] and states that she has numerous potential sick contacts. No other alleviating measures have been attempted.         Past Medical History:  Diagnosis Date  . Anxiety   . Arthritis   . Asthma   . Chronic right hip pain   . Degenerative disc disease, thoracic   . GERD (gastroesophageal reflux disease)   . Hypertension   . Irritable bowel syndrome (IBS)   . Scoliosis     Patient Active Problem List   Diagnosis Date Noted  . Gastroenteritis 08/31/2017  . Intractable vomiting with nausea 08/29/2017  . Anxiety 04/11/2016  . Arthritis 04/11/2016  . Asthma 04/11/2016  . Chronic right hip pain 04/11/2016  . Degenerative disc disease, thoracic 04/11/2016  . GERD (gastroesophageal reflux disease) 04/11/2016  . Hypertension 04/11/2016  . Irritable bowel syndrome (IBS) 04/11/2016  . Scoliosis 04/11/2016  . Chronic pain syndrome 04/11/2016  . Chronic bilateral low back pain without sciatica 04/11/2016  . Long term prescription benzodiazepine use 04/11/2016  . Encounter for opiate analgesic use agreement 04/11/2016  . Long term current use of opiate analgesic 04/11/2016  . Long term prescription opiate use 04/11/2016  . Opiate use  04/11/2016  . Obesity, Class II, BMI 35-39.9 04/11/2016    Past Surgical History:  Procedure Laterality Date  . ABDOMINAL HYSTERECTOMY    . CESAREAN SECTION  1985  . CHOLECYSTECTOMY    . TUBAL LIGATION      Prior to Admission medications   Medication Sig Start Date End Date Taking? Authorizing Provider  albuterol (PROVENTIL HFA;VENTOLIN HFA) 108 (90 Base) MCG/ACT inhaler Inhale 2 puffs into the lungs every 6 (six) hours as needed for wheezing or shortness of breath. 06/08/18   Nance Pear, MD  amLODipine (NORVASC) 5 MG tablet Take 1 tablet (5 mg total) by mouth daily. 08/01/18 08/01/19  Cuthriell, Charline Bills, PA-C  benzonatate (TESSALON PERLES) 100 MG capsule Take 1 capsule (100 mg total) by mouth every 6 (six) hours as needed for cough. 06/08/18 06/08/19  Nance Pear, MD  cyclobenzaprine (FLEXERIL) 10 MG tablet Take 1 tablet (10 mg total) by mouth 3 (three) times daily as needed. 05/28/18   Fisher, Linden Dolin, PA-C  dicyclomine (BENTYL) 10 MG capsule Take 1 capsule (10 mg total) by mouth 4 (four) times daily -  before meals and at bedtime for 14 days. 03/04/19 03/18/19  Lannie Fields, PA-C  furosemide (LASIX) 20 MG tablet Take 1 tablet (20 mg total) by mouth daily. 08/01/18 08/01/19  Cuthriell, Charline Bills, PA-C  losartan (COZAAR) 25 MG tablet Take 1 tablet (25 mg total) by mouth daily. 08/01/18 08/01/19  Cuthriell, Charline Bills, PA-C  meloxicam (MOBIC) 15 MG tablet Take 1 tablet (15 mg total) by mouth daily. 08/01/18   Cuthriell,  Delorise RoyalsJonathan D, PA-C  metoCLOPramide (REGLAN) 10 MG tablet Take 1 tablet (10 mg total) by mouth every 8 (eight) hours as needed for nausea or vomiting. 06/08/18 06/08/19  Phineas SemenGoodman, Graydon, MD  predniSONE (STERAPRED UNI-PAK 21 TAB) 10 MG (21) TBPK tablet Take 60 mg the first two days. Decrease by 10 mg every two days until medications run out. 12/26/18   Orvil FeilWoods, Luana Tatro M, PA-C    Allergies Zofran Frazier Richards[ondansetron hcl], Amoxicillin, Aspirin, Compazine, Dilaudid [hydromorphone hcl],  Penicillins, and Proton pump inhibitors  History reviewed. No pertinent family history.  Social History Social History   Tobacco Use  . Smoking status: Former Smoker    Quit date: 02/16/2014    Years since quitting: 5.0  . Smokeless tobacco: Never Used  Substance Use Topics  . Alcohol use: No  . Drug use: No     Review of Systems  Constitutional: Patient has fever. Eyes: No visual changes. No discharge ENT: No upper respiratory complaints. Cardiovascular: no chest pain. Respiratory: no cough. No SOB. Gastrointestinal: Patient has diarrhea. Patient had emesis.  Genitourinary: Negative for dysuria. No hematuria Musculoskeletal: Negative for musculoskeletal pain. Skin: Negative for rash, abrasions, lacerations, ecchymosis. Neurological: Negative for headaches, focal weakness or numbness.   ____________________________________________   PHYSICAL EXAM:  VITAL SIGNS: ED Triage Vitals  Enc Vitals Group     BP 03/04/19 1401 134/85     Pulse Rate 03/04/19 1401 88     Resp 03/04/19 1401 19     Temp 03/04/19 1401 98.6 F (37 C)     Temp Source 03/04/19 1401 Oral     SpO2 03/04/19 1401 98 %     Weight 03/04/19 1401 240 lb (108.9 kg)     Height 03/04/19 1401 5\' 4"  (1.626 m)     Head Circumference --      Peak Flow --      Pain Score 03/04/19 1403 8     Pain Loc --      Pain Edu? --      Excl. in GC? --      Constitutional: Alert and oriented.  Patient appears fatigued. Eyes: Conjunctivae are normal. PERRL. EOMI. Head: Atraumatic. ENT:      Mouth/Throat: Mucous membranes are moist. Hematological/Lymphatic/Immunilogical: No cervical lymphadenopathy.  Cardiovascular: Normal rate, regular rhythm. Normal S1 and S2.  Good peripheral circulation. Respiratory: Normal respiratory effort without tachypnea or retractions. Lungs CTAB. Good air entry to the bases with no decreased or absent breath sounds. Gastrointestinal: Bowel sounds 4 quadrants. Soft and nontender to  palpation. No guarding or rigidity. No palpable masses. No distention. No CVA tenderness. Musculoskeletal: Full range of motion to all extremities. No gross deformities appreciated. Neurologic:  Normal speech and language. No gross focal neurologic deficits are appreciated.  Skin:  Skin is warm, dry and intact. No rash noted. Psychiatric: Mood and affect are normal. Speech and behavior are normal. Patient exhibits appropriate insight and judgement.   ____________________________________________   LABS (all labs ordered are listed, but only abnormal results are displayed)  Labs Reviewed  COMPREHENSIVE METABOLIC PANEL - Abnormal; Notable for the following components:      Result Value   Potassium 3.2 (*)    Glucose, Bld 107 (*)    Calcium 8.6 (*)    Total Protein 6.1 (*)    Albumin 3.3 (*)    All other components within normal limits  URINALYSIS, COMPLETE (UACMP) WITH MICROSCOPIC - Abnormal; Notable for the following components:   Color, Urine STRAW (*)  APPearance CLEAR (*)    Bacteria, UA RARE (*)    All other components within normal limits  SARS CORONAVIRUS 2 (TAT 6-24 HRS)  LIPASE, BLOOD  CBC  INFLUENZA PANEL BY PCR (TYPE A & B)   ____________________________________________  EKG   ____________________________________________  RADIOLOGY   No results found.  ____________________________________________    PROCEDURES  Procedure(s) performed:    Procedures    Medications  dicyclomine (BENTYL) injection 20 mg (20 mg Intramuscular Given 03/04/19 1550)  sodium chloride 0.9 % bolus 1,000 mL (0 mLs Intravenous Stopped 03/04/19 1701)     ____________________________________________   INITIAL IMPRESSION / ASSESSMENT AND PLAN / ED COURSE  Pertinent labs & imaging results that were available during my care of the patient were reviewed by me and considered in my medical decision making (see chart for details).  Review of the [place] CSRS was performed in  accordance of the [facility] prior to dispensing any controlled drugs.         Assessment and plan Diarrhea Vomiting Fever 53 year old female presents to the emergency department with headache, diarrhea, body aches and one episode of emesis over the past 4 days.  Vital signs are reassuring in triage.  Patient appeared fatigued on exam.  She had no abdominal tenderness elicited to palpation.  There are no other pertinent findings on physical exam.  Differential diagnosis included viral gastroenteritis, COVID-19, influenza, cystitis, pancreatitis...  COVID-19 testing is in process at this time.  Patient tested negative for influenza A and B.  No leukopenia or leukocytosis on CBC.  Urinalysis was noncontributory for cystitis.  Lipase was within reference range.  IM Bentyl was given in the emergency department and patient reported that her abdominal cramping resolved.  Patient was advised to increase hydration at home and she was discharged with a short course of Bentyl.  Return precautions were given.  All patient questions were answered.  ____________________________________________  FINAL CLINICAL IMPRESSION(S) / ED DIAGNOSES  Final diagnoses:  Diarrhea, unspecified type  Non-intractable vomiting without nausea, unspecified vomiting type      NEW MEDICATIONS STARTED DURING THIS VISIT:  ED Discharge Orders         Ordered    dicyclomine (BENTYL) 10 MG capsule  3 times daily before meals & bedtime     03/04/19 1656              This chart was dictated using voice recognition software/Dragon. Despite best efforts to proofread, errors can occur which can change the meaning. Any change was purely unintentional.    Orvil Feil, PA-C 03/04/19 2132    Shaune Pollack, MD 03/07/19 [phone]

## 2019-03-04 NOTE — ED Notes (Signed)
See triage note  Presents with body aches ,fever,congestion and some abd cramping  Afebrile on arrival but states temp was 102 yesterday

## 2019-03-05 LAB — SARS CORONAVIRUS 2 (TAT 6-24 HRS): SARS Coronavirus 2: NEGATIVE

## 2019-06-11 ENCOUNTER — Emergency Department
Admission: EM | Admit: 2019-06-11 | Discharge: 2019-06-11 | Disposition: A | Discharge status: Home / Self Care | Payer: Medicaid Other | Attending: Student in an Organized Health Care Education/Training Program | Admitting: Student in an Organized Health Care Education/Training Program

## 2019-06-11 ENCOUNTER — Emergency Department: Payer: Medicaid Other

## 2019-06-11 ENCOUNTER — Other Ambulatory Visit: Payer: Self-pay

## 2019-06-11 DIAGNOSIS — I10 Essential (primary) hypertension: Secondary | ICD-10-CM | POA: Insufficient documentation

## 2019-06-11 DIAGNOSIS — Z87891 Personal history of nicotine dependence: Secondary | ICD-10-CM | POA: Insufficient documentation

## 2019-06-11 DIAGNOSIS — R110 Nausea: Secondary | ICD-10-CM | POA: Insufficient documentation

## 2019-06-11 DIAGNOSIS — J45909 Unspecified asthma, uncomplicated: Secondary | ICD-10-CM | POA: Insufficient documentation

## 2019-06-11 DIAGNOSIS — Z79899 Other long term (current) drug therapy: Secondary | ICD-10-CM | POA: Insufficient documentation

## 2019-06-11 DIAGNOSIS — R1012 Left upper quadrant pain: Secondary | ICD-10-CM | POA: Insufficient documentation

## 2019-06-11 LAB — COMPREHENSIVE METABOLIC PANEL
ALT: 10 U/L (ref 0–44)
AST: 17 U/L (ref 15–41)
Albumin: 3.5 g/dL (ref 3.5–5.0)
Alkaline Phosphatase: 53 U/L (ref 38–126)
Anion gap: 6 (ref 5–15)
BUN: 13 mg/dL (ref 6–20)
CO2: 28 mmol/L (ref 22–32)
Calcium: 8.5 mg/dL — ABNORMAL LOW (ref 8.9–10.3)
Chloride: 105 mmol/L (ref 98–111)
Creatinine, Ser: 1.05 mg/dL — ABNORMAL HIGH (ref 0.44–1.00)
GFR calc Af Amer: >60 mL/min (ref >60)
GFR calc non Af Amer: >60 mL/min (ref >60)
Glucose, Bld: 102 mg/dL — ABNORMAL HIGH (ref 70–99)
Potassium: 3.3 mmol/L — ABNORMAL LOW (ref 3.5–5.1)
Sodium: 139 mmol/L (ref 135–145)
Total Bilirubin: 0.5 mg/dL (ref 0.3–1.2)
Total Protein: 6.5 g/dL (ref 6.5–8.1)

## 2019-06-11 LAB — CBC
HCT: 43.8 % (ref 36.0–46.0)
Hemoglobin: 14.2 g/dL (ref 12.0–15.0)
MCH: 30.2 pg (ref 26.0–34.0)
MCHC: 32.4 g/dL (ref 30.0–36.0)
MCV: 93.2 fL (ref 80.0–100.0)
Platelets: 270 10*3/uL (ref 150–400)
RBC: 4.7 MIL/uL (ref 3.87–5.11)
RDW: 13.1 % (ref 11.5–15.5)
WBC: 10.7 10*3/uL — ABNORMAL HIGH (ref 4.0–10.5)
nRBC: 0 % (ref 0.0–0.2)

## 2019-06-11 LAB — URINALYSIS, COMPLETE (UACMP) WITH MICROSCOPIC
Bacteria, UA: NONE SEEN
Bilirubin Urine: NEGATIVE
Glucose, UA: NEGATIVE mg/dL
Hgb urine dipstick: NEGATIVE
Ketones, ur: 5 mg/dL — AB
Leukocytes,Ua: NEGATIVE
Nitrite: NEGATIVE
Protein, ur: 30 mg/dL — AB
Specific Gravity, Urine: 1.033 — ABNORMAL HIGH (ref 1.005–1.030)
pH: 5 (ref 5.0–8.0)

## 2019-06-11 LAB — LIPASE, BLOOD: Lipase: 37 U/L (ref 11–51)

## 2019-06-11 MED ORDER — IOHEXOL 300 MG/ML  SOLN
100.0000 mL | Freq: Once | INTRAMUSCULAR | Status: AC | PRN
  Administered 2019-06-11: 100 mL via INTRAVENOUS

## 2019-06-11 MED ORDER — SODIUM CHLORIDE 0.9 % IV BOLUS: 500.0000 mL | Freq: Once | INTRAVENOUS | Status: DC

## 2019-06-11 MED ORDER — HYDROCODONE-ACETAMINOPHEN 5-325 MG PO TABS: 1.0000 | ORAL_TABLET | ORAL | 0 refills | Status: DC | PRN

## 2019-06-11 MED ORDER — OXYCODONE-ACETAMINOPHEN 5-325 MG PO TABS
1.0000 | ORAL_TABLET | Freq: Once | ORAL | Status: AC
  Administered 2019-06-11: 1 via ORAL
  Filled 2019-06-11: qty 1

## 2019-06-11 NOTE — ED Triage Notes (Signed)
Report left lower quadrant pain, ongoing and recurrent for approx 1 months. Pt denies NVD. Pain worse with movement. Pt concerned she has an abdominal hernia. Pt alert and oriented X4, cooperative, RR even and unlabored, color WNL. Pt in NAD.

## 2019-06-11 NOTE — Discharge Instructions (Addendum)

## 2019-06-11 NOTE — ED Notes (Signed)
Pt to CT

## 2019-06-11 NOTE — ED Provider Notes (Signed)
[facility] Emergency Department Provider Note    First MD Initiated Contact with Patient 06/11/19 1858     (approximate)  I have reviewed the triage vital signs and the nursing notes.   HISTORY  Chief Complaint Abdominal Pain    HPI Diane Warner is a 54 y.o. female presents to the ER for 1 month of intermittent left-sided abdominal pain that is worse with movement.  Denies any heavy lifting.  She works as a Scientist, water quality.  Denies any dysuria or flank pain.  Pain worsened with palpation.  No fevers.  Does have some nausea with it at times.  Has been taken Tylenol and Motrin without improvement.    Past Medical History:  Diagnosis Date  . Anxiety   . Arthritis   . Asthma   . Chronic right hip pain   . Degenerative disc disease, thoracic   . GERD (gastroesophageal reflux disease)   . Hypertension   . Irritable bowel syndrome (IBS)   . Scoliosis    No family history on file. Past Surgical History:  Procedure Laterality Date  . ABDOMINAL HYSTERECTOMY    . CESAREAN SECTION  1985  . CHOLECYSTECTOMY    . TUBAL LIGATION     Patient Active Problem List   Diagnosis Date Noted  . Gastroenteritis 08/31/2017  . Intractable vomiting with nausea 08/29/2017  . Anxiety 04/11/2016  . Arthritis 04/11/2016  . Asthma 04/11/2016  . Chronic right hip pain 04/11/2016  . Degenerative disc disease, thoracic 04/11/2016  . GERD (gastroesophageal reflux disease) 04/11/2016  . Hypertension 04/11/2016  . Irritable bowel syndrome (IBS) 04/11/2016  . Scoliosis 04/11/2016  . Chronic pain syndrome 04/11/2016  . Chronic bilateral low back pain without sciatica 04/11/2016  . Long term prescription benzodiazepine use 04/11/2016  . Encounter for opiate analgesic use agreement 04/11/2016  . Long term current use of opiate analgesic 04/11/2016  . Long term prescription opiate use 04/11/2016  . Opiate use 04/11/2016  . Obesity, Class II, BMI 35-39.9 04/11/2016       Prior to Admission medications   Medication Sig Start Date End Date Taking? Authorizing Provider  albuterol (PROVENTIL HFA;VENTOLIN HFA) 108 (90 Base) MCG/ACT inhaler Inhale 2 puffs into the lungs every 6 (six) hours as needed for wheezing or shortness of breath. 06/08/18   Nance Pear, MD  amLODipine (NORVASC) 5 MG tablet Take 1 tablet (5 mg total) by mouth daily. 08/01/18 08/01/19  Cuthriell, Charline Bills, PA-C  cyclobenzaprine (FLEXERIL) 10 MG tablet Take 1 tablet (10 mg total) by mouth 3 (three) times daily as needed. 05/28/18   Fisher, Linden Dolin, PA-C  dicyclomine (BENTYL) 10 MG capsule Take 1 capsule (10 mg total) by mouth 4 (four) times daily -  before meals and at bedtime for 14 days. 03/04/19 03/18/19  Lannie Fields, PA-C  furosemide (LASIX) 20 MG tablet Take 1 tablet (20 mg total) by mouth daily. 08/01/18 08/01/19  Cuthriell, Charline Bills, PA-C  HYDROcodone-acetaminophen (NORCO) 5-325 MG tablet Take 1 tablet by mouth every 4 (four) hours as needed for moderate pain. 06/11/19   Merlyn Lot, MD  losartan (COZAAR) 25 MG tablet Take 1 tablet (25 mg total) by mouth daily. 08/01/18 08/01/19  Cuthriell, Charline Bills, PA-C  meloxicam (MOBIC) 15 MG tablet Take 1 tablet (15 mg total) by mouth daily. 08/01/18   Cuthriell, Charline Bills, PA-C  metoCLOPramide (REGLAN) 10 MG tablet Take 1 tablet (10 mg total) by mouth every 8 (eight) hours as needed for nausea or  vomiting. 06/08/18 06/08/19  Phineas Semen, MD  predniSONE (STERAPRED UNI-PAK 21 TAB) 10 MG (21) TBPK tablet Take 60 mg the first two days. Decrease by 10 mg every two days until medications run out. 12/26/18   Orvil Feil, PA-C    Allergies Zofran Frazier Richards hcl], Amoxicillin, Aspirin, Compazine, Dilaudid [hydromorphone hcl], Penicillins, and Proton pump inhibitors    Social History Social History   Tobacco Use  . Smoking status: Former Smoker    Quit date: 02/16/2014    Years since quitting: 5.3  . Smokeless tobacco: Never Used   Substance Use Topics  . Alcohol use: No  . Drug use: No    Review of Systems Patient denies headaches, rhinorrhea, blurry vision, numbness, shortness of breath, chest pain, edema, cough, abdominal pain, nausea, vomiting, diarrhea, dysuria, fevers, rashes or hallucinations unless otherwise stated above in HPI. ____________________________________________   PHYSICAL EXAM:  VITAL SIGNS: Vitals:   06/11/19 1553 06/11/19 1736  BP: (!) 140/99 131/77  Pulse: 88 85  Resp: 16 18  Temp: 98.8 F (37.1 C) 98.2 F (36.8 C)  SpO2: 99% 96%    Constitutional: Alert and oriented.  Eyes: Conjunctivae are normal.  Head: Atraumatic. Nose: No congestion/rhinnorhea. Mouth/Throat: Mucous membranes are moist.   Neck: No stridor. Painless ROM.  Cardiovascular: Normal rate, regular rhythm. Grossly normal heart sounds.  Good peripheral circulation. Respiratory: Normal respiratory effort.  No retractions. Lungs CTAB. Gastrointestinal: Soft with mild ttp of left UQ. No distention. No abdominal bruits. No CVA tenderness. Genitourinary:  Musculoskeletal: No lower extremity tenderness nor edema.  No joint effusions. Neurologic:  Normal speech and language. No gross focal neurologic deficits are appreciated. No facial droop Skin:  Skin is warm, dry and intact. No rash noted. Psychiatric: Mood and affect are normal. Speech and behavior are normal.  ____________________________________________   LABS (all labs ordered are listed, but only abnormal results are displayed)  Results for orders placed or performed during the hospital encounter of 06/11/19 (from the past 24 hour(s))  Lipase, blood     Status: None   Collection Time: 06/11/19  3:55 PM  Result Value Ref Range   Lipase 37 11 - 51 U/L  Comprehensive metabolic panel     Status: Abnormal   Collection Time: 06/11/19  3:55 PM  Result Value Ref Range   Sodium 139 135 - 145 mmol/L   Potassium 3.3 (L) 3.5 - 5.1 mmol/L   Chloride 105 98 - 111  mmol/L   CO2 28 22 - 32 mmol/L   Glucose, Bld 102 (H) 70 - 99 mg/dL   BUN 13 6 - 20 mg/dL   Creatinine, Ser 9.47 (H) 0.44 - 1.00 mg/dL   Calcium 8.5 (L) 8.9 - 10.3 mg/dL   Total Protein 6.5 6.5 - 8.1 g/dL   Albumin 3.5 3.5 - 5.0 g/dL   AST 17 15 - 41 U/L   ALT 10 0 - 44 U/L   Alkaline Phosphatase 53 38 - 126 U/L   Total Bilirubin 0.5 0.3 - 1.2 mg/dL   GFR calc non Af Amer >60 >60 mL/min   GFR calc Af Amer >60 >60 mL/min   Anion gap 6 5 - 15  CBC     Status: Abnormal   Collection Time: 06/11/19  3:55 PM  Result Value Ref Range   WBC 10.7 (H) 4.0 - 10.5 K/uL   RBC 4.70 3.87 - 5.11 MIL/uL   Hemoglobin 14.2 12.0 - 15.0 g/dL   HCT 65.4 65.0 - 35.4 %  MCV 93.2 80.0 - 100.0 fL   MCH 30.2 26.0 - 34.0 pg   MCHC 32.4 30.0 - 36.0 g/dL   RDW 94.7 65.4 - 65.0 %   Platelets 270 150 - 400 K/uL   nRBC 0.0 0.0 - 0.2 %  Urinalysis, Complete w Microscopic     Status: Abnormal   Collection Time: 06/11/19  3:55 PM  Result Value Ref Range   Color, Urine YELLOW (A) YELLOW   APPearance HAZY (A) CLEAR   Specific Gravity, Urine 1.033 (H) 1.005 - 1.030   pH 5.0 5.0 - 8.0   Glucose, UA NEGATIVE NEGATIVE mg/dL   Hgb urine dipstick NEGATIVE NEGATIVE   Bilirubin Urine NEGATIVE NEGATIVE   Ketones, ur 5 (A) NEGATIVE mg/dL   Protein, ur 30 (A) NEGATIVE mg/dL   Nitrite NEGATIVE NEGATIVE   Leukocytes,Ua NEGATIVE NEGATIVE   RBC / HPF 0-5 0 - 5 RBC/hpf   WBC, UA 0-5 0 - 5 WBC/hpf   Bacteria, UA NONE SEEN NONE SEEN   Squamous Epithelial / LPF 6-10 0 - 5   Mucus PRESENT    ____________________________________________ ____________________________________________  RADIOLOGY  I personally reviewed all radiographic images ordered to evaluate for the above acute complaints and reviewed radiology reports and findings.  These findings were personally discussed with the patient.  Please see medical record for radiology report.  ____________________________________________   PROCEDURES  Procedure(s)  performed:  Procedures    Critical Care performed: no ____________________________________________   INITIAL IMPRESSION / ASSESSMENT AND PLAN / ED COURSE  Pertinent labs & imaging results that were available during my care of the patient were reviewed by me and considered in my medical decision making (see chart for details).   DDX: enteritic, diverticulitis, msk strain, shingles, stone, pyelo, hernia  Diane Warner is a 54 y.o. who presents to the ED with symptoms as described above.  Patient in no acute distress.  CT imaging ordered for above differential was reassuring.  No signs of infectious process.  Likely musculoskeletal strain.  I do not appreciate any hernia.  Patient tolerating p.o.  Will be given pain medication and outpatient follow-up.     The patient was evaluated in Emergency Department today for the symptoms described in the history of present illness. He/she was evaluated in the context of the global COVID-19 pandemic, which necessitated consideration that the patient might be at risk for infection with the SARS-CoV-2 virus that causes COVID-19. Institutional protocols and algorithms that pertain to the evaluation of patients at risk for COVID-19 are in a state of rapid change based on information released by regulatory bodies including the CDC and federal and state organizations. These policies and algorithms were followed during the patient's care in the ED.  As part of my medical decision making, I reviewed the following data within the electronic MEDICAL RECORD NUMBER Nursing notes reviewed and incorporated, Labs reviewed, notes from prior ED visits and [place] Controlled Substance Database   ____________________________________________   FINAL CLINICAL IMPRESSION(S) / ED DIAGNOSES  Final diagnoses:  Left upper quadrant abdominal pain      NEW MEDICATIONS STARTED DURING THIS VISIT:  New Prescriptions   HYDROCODONE-ACETAMINOPHEN (NORCO) 5-325 MG TABLET    Take 1  tablet by mouth every 4 (four) hours as needed for moderate pain.     Note:  This document was prepared using Dragon voice recognition software and may include unintentional dictation errors.    Willy Eddy, MD 06/11/19 [phone]

## 2019-07-22 ENCOUNTER — Encounter: Payer: Self-pay | Admitting: Emergency Medicine

## 2019-07-22 ENCOUNTER — Emergency Department: Payer: Self-pay

## 2019-07-22 ENCOUNTER — Other Ambulatory Visit: Payer: Self-pay

## 2019-07-22 ENCOUNTER — Emergency Department
Admission: EM | Admit: 2019-07-22 | Discharge: 2019-07-22 | Disposition: A | Discharge status: Home / Self Care | Payer: Self-pay | Attending: Emergency Medicine | Admitting: Emergency Medicine

## 2019-07-22 DIAGNOSIS — Z87891 Personal history of nicotine dependence: Secondary | ICD-10-CM | POA: Insufficient documentation

## 2019-07-22 DIAGNOSIS — L03113 Cellulitis of right upper limb: Secondary | ICD-10-CM | POA: Insufficient documentation

## 2019-07-22 DIAGNOSIS — Z79899 Other long term (current) drug therapy: Secondary | ICD-10-CM | POA: Insufficient documentation

## 2019-07-22 DIAGNOSIS — I10 Essential (primary) hypertension: Secondary | ICD-10-CM | POA: Insufficient documentation

## 2019-07-22 DIAGNOSIS — S5001XA Contusion of right elbow, initial encounter: Secondary | ICD-10-CM | POA: Insufficient documentation

## 2019-07-22 DIAGNOSIS — W268XXA Contact with other sharp object(s), not elsewhere classified, initial encounter: Secondary | ICD-10-CM | POA: Insufficient documentation

## 2019-07-22 DIAGNOSIS — Y92094 Garage of other non-institutional residence as the place of occurrence of the external cause: Secondary | ICD-10-CM | POA: Insufficient documentation

## 2019-07-22 DIAGNOSIS — R21 Rash and other nonspecific skin eruption: Secondary | ICD-10-CM | POA: Insufficient documentation

## 2019-07-22 DIAGNOSIS — Y9301 Activity, walking, marching and hiking: Secondary | ICD-10-CM | POA: Insufficient documentation

## 2019-07-22 DIAGNOSIS — J45909 Unspecified asthma, uncomplicated: Secondary | ICD-10-CM | POA: Insufficient documentation

## 2019-07-22 DIAGNOSIS — Y998 Other external cause status: Secondary | ICD-10-CM | POA: Insufficient documentation

## 2019-07-22 MED ORDER — CLINDAMYCIN HCL 150 MG PO CAPS: 300.0000 mg | ORAL_CAPSULE | Freq: Four times a day (QID) | ORAL | 0 refills | Status: AC

## 2019-07-22 MED ORDER — CLINDAMYCIN HCL 150 MG PO CAPS
300.0000 mg | ORAL_CAPSULE | Freq: Once | ORAL | Status: AC
  Administered 2019-07-22: 300 mg via ORAL
  Filled 2019-07-22: qty 2

## 2019-07-22 MED ORDER — HYDROCODONE-ACETAMINOPHEN 5-325 MG PO TABS
1.0000 | ORAL_TABLET | ORAL | Status: AC
  Administered 2019-07-22: 1 via ORAL
  Filled 2019-07-22: qty 1

## 2019-07-22 MED ORDER — HYDROCODONE-ACETAMINOPHEN 5-325 MG PO TABS: 1.0000 | ORAL_TABLET | Freq: Four times a day (QID) | ORAL | 0 refills | Status: DC | PRN

## 2019-07-22 NOTE — ED Triage Notes (Signed)
Presents with pain and swelling to right elbow area  States she had gotten a piece of metal in her arm last Monday    Area is swollen and she has a rash

## 2019-07-22 NOTE — ED Provider Notes (Signed)
[facility] EMERGENCY DEPARTMENT Provider Note   CSN: [id number] Arrival date & time: 07/22/19  1752     History Chief Complaint  Patient presents with  . Arm Pain    Diane Warner is a 54 y.o. female presents to the emergency department evaluation of right elbow pain.  Patient states 1 week ago she was walking by the garage door, piece of metal cut her right elbow along the lateral olecranon region.  She kept this covered with a Band-Aid, appears to have a rash in the area of erythema and a perfect square distribution consistent with a Band-Aid over the area.  Her tetanus is up-to-date.  She states over the last week she has had increased pain and swelling throughout the elbow.  No fevers.  No warmth.  Redness only around the area where the Band-Aid was.  HPI     Past Medical History:  Diagnosis Date  . Anxiety   . Arthritis   . Asthma   . Chronic right hip pain   . Degenerative disc disease, thoracic   . GERD (gastroesophageal reflux disease)   . Hypertension   . Irritable bowel syndrome (IBS)   . Scoliosis     Patient Active Problem List   Diagnosis Date Noted  . Gastroenteritis 08/31/2017  . Intractable vomiting with nausea 08/29/2017  . Anxiety 04/11/2016  . Arthritis 04/11/2016  . Asthma 04/11/2016  . Chronic right hip pain 04/11/2016  . Degenerative disc disease, thoracic 04/11/2016  . GERD (gastroesophageal reflux disease) 04/11/2016  . Hypertension 04/11/2016  . Irritable bowel syndrome (IBS) 04/11/2016  . Scoliosis 04/11/2016  . Chronic pain syndrome 04/11/2016  . Chronic bilateral low back pain without sciatica 04/11/2016  . Long term prescription benzodiazepine use 04/11/2016  . Encounter for opiate analgesic use agreement 04/11/2016  . Long term current use of opiate analgesic 04/11/2016  . Long term prescription opiate use 04/11/2016  . Opiate use 04/11/2016  . Obesity, Class II, BMI 35-39.9 04/11/2016    Past Surgical  History:  Procedure Laterality Date  . ABDOMINAL HYSTERECTOMY    . CESAREAN SECTION  1985  . CHOLECYSTECTOMY    . TUBAL LIGATION       OB History    Gravida  7   Para  5   Term  3   Preterm  2   AB  2   Living  0     SAB  2   TAB  0   Ectopic  0   Multiple  0   Live Births              No family history on file.  Social History   Tobacco Use  . Smoking status: Former Smoker    Quit date: 02/16/2014    Years since quitting: 5.4  . Smokeless tobacco: Never Used  Substance Use Topics  . Alcohol use: No  . Drug use: No    Home Medications Prior to Admission medications   Medication Sig Start Date End Date Taking? Authorizing Provider  albuterol (PROVENTIL HFA;VENTOLIN HFA) 108 (90 Base) MCG/ACT inhaler Inhale 2 puffs into the lungs every 6 (six) hours as needed for wheezing or shortness of breath. 06/08/18   Nance Pear, MD  amLODipine (NORVASC) 5 MG tablet Take 1 tablet (5 mg total) by mouth daily. 08/01/18 08/01/19  Cuthriell, Charline Bills, PA-C  clindamycin (CLEOCIN) 150 MG capsule Take 2 capsules (300 mg total) by mouth 4 (four) times daily for 7  days. 07/22/19 07/29/19  Evon Slack, PA-C  cyclobenzaprine (FLEXERIL) 10 MG tablet Take 1 tablet (10 mg total) by mouth 3 (three) times daily as needed. 05/28/18   Fisher, Roselyn Bering, PA-C  dicyclomine (BENTYL) 10 MG capsule Take 1 capsule (10 mg total) by mouth 4 (four) times daily -  before meals and at bedtime for 14 days. 03/04/19 03/18/19  Orvil Feil, PA-C  furosemide (LASIX) 20 MG tablet Take 1 tablet (20 mg total) by mouth daily. 08/01/18 08/01/19  Cuthriell, Delorise Royals, PA-C  HYDROcodone-acetaminophen (NORCO) 5-325 MG tablet Take 1 tablet by mouth every 6 (six) hours as needed for moderate pain. 07/22/19   Evon Slack, PA-C  losartan (COZAAR) 25 MG tablet Take 1 tablet (25 mg total) by mouth daily. 08/01/18 08/01/19  Cuthriell, Delorise Royals, PA-C  metoCLOPramide (REGLAN) 10 MG tablet Take 1 tablet (10 mg  total) by mouth every 8 (eight) hours as needed for nausea or vomiting. 06/08/18 06/08/19  Phineas Semen, MD    Allergies    Zofran Frazier Richards hcl], Amoxicillin, Aspirin, Compazine, Dilaudid [hydromorphone hcl], Penicillins, and Proton pump inhibitors  Review of Systems   Review of Systems  Constitutional: Negative for chills and fever.  Respiratory: Negative for shortness of breath.   Cardiovascular: Negative for chest pain.  Gastrointestinal: Negative for nausea and vomiting.  Musculoskeletal: Positive for arthralgias and joint swelling.  Skin: Positive for rash and wound.  Neurological: Negative for headaches.    Physical Exam Updated Vital Signs BP 136/72 (BP Location: Left Arm)   Pulse 79   Temp 98.1 F (36.7 C) (Oral)   Resp 18   Ht 5\' 4"  (1.626 m)   Wt 108 kg   LMP  (LMP Unknown)   SpO2 98%   BMI 40.87 kg/m   Physical Exam Constitutional:      Appearance: She is well-developed.  HENT:     Head: Normocephalic and atraumatic.  Eyes:     Conjunctiva/sclera: Conjunctivae normal.  Cardiovascular:     Rate and Rhythm: Normal rate.  Pulmonary:     Effort: Pulmonary effort is normal. No respiratory distress.  Musculoskeletal:     Cervical back: Normal range of motion.     Comments: Right elbow with tenderness along olecranon.  Laceration/abrasion site from garage door 7 days ago appears to be completely healed with no visible or palpable foreign body.  Erythema in a rectangular distribution consistent with Band-Aid be applied is present.  Within the square area of erythema there are some small ruptured blisters that appear to have some surrounding erythema.  There is no warmth.  Mild swelling throughout the elbow.  No noticeable fluid collection along the olecranon bursa.  Patient has painful elbow range of motion with flexion and extension, 30 to 95 degrees total range of motion.  She is neurovascular intact in right upper extremity.  Good strength with resisted tricep  extension, no defect in the distal triceps tendon.  Skin:    General: Skin is warm.     Findings: No rash.  Neurological:     Mental Status: She is alert and oriented to person, place, and time.  Psychiatric:        Behavior: Behavior normal.        Thought Content: Thought content normal.     ED Results / Procedures / Treatments   Labs (all labs ordered are listed, but only abnormal results are displayed) Labs Reviewed - No data to display  EKG None  Radiology  DG Elbow Complete Right  Result Date: 07/22/2019 CLINICAL DATA:  54 year old female with redness and swelling of the right elbow status post penetrating trauma from a piece of metal last week. EXAM: RIGHT ELBOW - COMPLETE 3+ VIEW COMPARISON:  None. FINDINGS: No radiopaque foreign body identified. No soft tissue gas identified. No evidence of right elbow joint effusion. Bone mineralization is within normal limits. Preserved joint spaces and alignment. Mild lateral elbow degenerative spurring. No acute osseous abnormality identified. IMPRESSION: Negative. Electronically Signed   By: Odessa Fleming M.D.   On: 07/22/2019 19:04    Procedures Procedures (including critical care time)  Medications Ordered in ED Medications  HYDROcodone-acetaminophen (NORCO/VICODIN) 5-325 MG per tablet 1 tablet (1 tablet Oral Given 07/22/19 1844)    ED Course  I have reviewed the triage vital signs and the nursing notes.  Pertinent labs & imaging results that were available during my care of the patient were reviewed by me and considered in my medical decision making (see chart for details).    MDM Rules/Calculators/A&P                      54 year old female with injury to the right elbow 1 week ago.  Suffered a scrape, puncture wound from a piece of metal.  No visible, palpable foreign body.  No foreign body seen on imaging.  Her tetanus is up-to-date.  Wound is healed, there is some surrounding erythema, some of this could be partly due to adhesive  bandage.  I recommend patient avoid any adhesive bandages and continue with gauze and Ace wrap.  She is placed on antibiotic due to some concern for possible early cellulitis.  There is no sign of elbow effusion on exam or x-ray.  No signs of compartment syndrome.  Patient understands signs and symptoms to return to ED for. Final Clinical Impression(s) / ED Diagnoses Final diagnoses:  Contusion of right elbow, initial encounter  Cellulitis of right elbow    Rx / DC Orders ED Discharge Orders         Ordered    clindamycin (CLEOCIN) 150 MG capsule  4 times daily     07/22/19 1936    HYDROcodone-acetaminophen (NORCO) 5-325 MG tablet  Every 6 hours PRN     07/22/19 1936           Ronnette Juniper 07/22/19 1939    Chesley Noon, MD 07/23/19 0002

## 2019-07-22 NOTE — Discharge Instructions (Addendum)
Please rest ice and elevate the right elbow.  Take antibiotics as prescribed.  Use Ace wrap and gauze to keep elbow covered and clean.  Return to the ER for any increasing pain, swelling warmth redness or fevers.

## 2019-07-30 ENCOUNTER — Emergency Department
Admission: EM | Admit: 2019-07-30 | Discharge: 2019-07-30 | Disposition: A | Discharge status: Home / Self Care | Payer: Medicaid Other | Attending: Emergency Medicine | Admitting: Emergency Medicine

## 2019-07-30 ENCOUNTER — Other Ambulatory Visit: Payer: Self-pay

## 2019-07-30 ENCOUNTER — Encounter: Payer: Self-pay | Admitting: Emergency Medicine

## 2019-07-30 DIAGNOSIS — I10 Essential (primary) hypertension: Secondary | ICD-10-CM | POA: Insufficient documentation

## 2019-07-30 DIAGNOSIS — Z87891 Personal history of nicotine dependence: Secondary | ICD-10-CM | POA: Insufficient documentation

## 2019-07-30 DIAGNOSIS — J45909 Unspecified asthma, uncomplicated: Secondary | ICD-10-CM | POA: Insufficient documentation

## 2019-07-30 DIAGNOSIS — T7840XA Allergy, unspecified, initial encounter: Secondary | ICD-10-CM

## 2019-07-30 DIAGNOSIS — Z79899 Other long term (current) drug therapy: Secondary | ICD-10-CM | POA: Insufficient documentation

## 2019-07-30 HISTORY — DX: Cellulitis, unspecified: L03.90

## 2019-07-30 LAB — BASIC METABOLIC PANEL
Anion gap: 7 (ref 5–15)
BUN: 11 mg/dL (ref 6–20)
CO2: 26 mmol/L (ref 22–32)
Calcium: 8.2 mg/dL — ABNORMAL LOW (ref 8.9–10.3)
Chloride: 105 mmol/L (ref 98–111)
Creatinine, Ser: 0.72 mg/dL (ref 0.44–1.00)
GFR calc Af Amer: >60 mL/min (ref >60)
GFR calc non Af Amer: >60 mL/min (ref >60)
Glucose, Bld: 139 mg/dL — ABNORMAL HIGH (ref 70–99)
Potassium: 4.5 mmol/L (ref 3.5–5.1)
Sodium: 138 mmol/L (ref 135–145)

## 2019-07-30 LAB — CBC WITH DIFFERENTIAL/PLATELET
Abs Immature Granulocytes: 0.03 10*3/uL (ref 0.00–0.07)
Basophils Absolute: 0.1 10*3/uL (ref 0.0–0.1)
Basophils Relative: 1 %
Eosinophils Absolute: 0.5 10*3/uL (ref 0.0–0.5)
Eosinophils Relative: 7 %
HCT: 42.2 % (ref 36.0–46.0)
Hemoglobin: 13.3 g/dL (ref 12.0–15.0)
Immature Granulocytes: 0 %
Lymphocytes Relative: 27 %
Lymphs Abs: 2 10*3/uL (ref 0.7–4.0)
MCH: 29.8 pg (ref 26.0–34.0)
MCHC: 31.5 g/dL (ref 30.0–36.0)
MCV: 94.6 fL (ref 80.0–100.0)
Monocytes Absolute: 0.7 10*3/uL (ref 0.1–1.0)
Monocytes Relative: 9 %
Neutro Abs: 4.2 10*3/uL (ref 1.7–7.7)
Neutrophils Relative %: 56 %
Platelets: 210 10*3/uL (ref 150–400)
RBC: 4.46 MIL/uL (ref 3.87–5.11)
RDW: 13.2 % (ref 11.5–15.5)
WBC: 7.5 10*3/uL (ref 4.0–10.5)
nRBC: 0 % (ref 0.0–0.2)

## 2019-07-30 LAB — PROCALCITONIN: Procalcitonin: <0.1 ng/mL

## 2019-07-30 LAB — LACTIC ACID, PLASMA: Lactic Acid, Venous: 1.3 mmol/L (ref 0.5–1.9)

## 2019-07-30 MED ORDER — METOCLOPRAMIDE HCL 5 MG/ML IJ SOLN
10.0000 mg | INTRAMUSCULAR | Status: AC
  Administered 2019-07-30: 07:00:00 10 mg via INTRAVENOUS
  Filled 2019-07-30: qty 2

## 2019-07-30 MED ORDER — METOCLOPRAMIDE HCL 10 MG PO TABS: 10.0000 mg | ORAL_TABLET | Freq: Three times a day (TID) | ORAL | 0 refills | Status: DC | PRN

## 2019-07-30 MED ORDER — KETOROLAC TROMETHAMINE 30 MG/ML IJ SOLN
15.0000 mg | Freq: Once | INTRAMUSCULAR | Status: AC
  Administered 2019-07-30: 15 mg via INTRAVENOUS
  Filled 2019-07-30: qty 1

## 2019-07-30 NOTE — ED Triage Notes (Signed)
Pt presents to ED with redness to right elbow and forearm in addition to her left forearm and both legs. Was treated for cellulitis on 3/8 and discharged home. Has been taking her antibiotics as prescribed. Denies drainage swelling or warmth.

## 2019-07-30 NOTE — ED Notes (Signed)
1st set of blood cultures collected from left anterior forearm by Gwynneth Munson, RN.

## 2019-07-30 NOTE — Discharge Instructions (Signed)
As we discussed, your symptoms do not appear consistent with cellulitis, but instead they appear more consistent with an allergic reaction, either to something on your skin or less likely to the antibiotic you are taking.  Recommend you stop taking the clindamycin and try taking over-the-counter cetirizine (Zyrtec) or Benadryl according to label instructions.  Drink plenty of fluid and use the prescribed nausea medicine as needed.  Follow-up with your regular doctor or contact the number listed above to establish a primary care provider.  Return to the emergency department if you develop new or worsening symptoms that concern you.

## 2019-07-30 NOTE — ED Provider Notes (Signed)
[facility] Emergency Department Provider Note  ____________________________________________   First MD Initiated Contact with Patient 07/30/19 [phone]     (approximate)  I have reviewed the triage vital signs and the nursing notes.   HISTORY  Chief Complaint Cellulitis    HPI Diane Warner is a 54 y.o. female with medical history as listed below who presents for evaluation of concern of worsening cellulitis  of her right arm.  She was seen about a week ago in the emergency department after getting scratched on her right elbow by piece of metal on a garage door a week prior to that (about 2 weeks ago).  She says that she had some redness at the site but reading the ED provider note by Mr. Floyce Stakes indicates that the laceration was well-healed and the redness was in a square pattern consistent with a contact dermatitis from the adhesive on the Band-Aid she had over the wound.  In order to be careful that a subtle infection was not missed, he prescribed a 10-day course of clindamycin of which she has taken 8 days.  She presents tonight for what she describes as worsening redness in various patches on her right arm as well as on the lower part of both of her legs.  She also reports that her stomach is felt upset and that she has had some nausea and diarrhea over the last couple of days.  No fever or chills.  No chest pain or shortness of breath.  She has no allergies to anything other than medications of which she is aware.  She describes the symptoms as severe and nothing in particular makes them better or worse.        Past Medical History:  Diagnosis Date  . Anxiety   . Arthritis   . Asthma   . Cellulitis   . Chronic right hip pain   . Degenerative disc disease, thoracic   . GERD (gastroesophageal reflux disease)   . Hypertension   . Irritable bowel syndrome (IBS)   . Scoliosis     Patient Active Problem List   Diagnosis Date Noted  .  Gastroenteritis 08/31/2017  . Intractable vomiting with nausea 08/29/2017  . Anxiety 04/11/2016  . Arthritis 04/11/2016  . Asthma 04/11/2016  . Chronic right hip pain 04/11/2016  . Degenerative disc disease, thoracic 04/11/2016  . GERD (gastroesophageal reflux disease) 04/11/2016  . Hypertension 04/11/2016  . Irritable bowel syndrome (IBS) 04/11/2016  . Scoliosis 04/11/2016  . Chronic pain syndrome 04/11/2016  . Chronic bilateral low back pain without sciatica 04/11/2016  . Long term prescription benzodiazepine use 04/11/2016  . Encounter for opiate analgesic use agreement 04/11/2016  . Long term current use of opiate analgesic 04/11/2016  . Long term prescription opiate use 04/11/2016  . Opiate use 04/11/2016  . Obesity, Class II, BMI 35-39.9 04/11/2016    Past Surgical History:  Procedure Laterality Date  . ABDOMINAL HYSTERECTOMY    . CESAREAN SECTION  1985  . CHOLECYSTECTOMY    . TUBAL LIGATION      Prior to Admission medications   Medication Sig Start Date End Date Taking? Authorizing Provider  albuterol (PROVENTIL HFA;VENTOLIN HFA) 108 (90 Base) MCG/ACT inhaler Inhale 2 puffs into the lungs every 6 (six) hours as needed for wheezing or shortness of breath. 06/08/18   Phineas Semen, MD  amLODipine (NORVASC) 5 MG tablet Take 1 tablet (5 mg total) by mouth daily. 08/01/18 08/01/19  Cuthriell, Delorise Royals, PA-C  cyclobenzaprine (FLEXERIL)  10 MG tablet Take 1 tablet (10 mg total) by mouth 3 (three) times daily as needed. 05/28/18   Fisher, Roselyn Bering, PA-C  dicyclomine (BENTYL) 10 MG capsule Take 1 capsule (10 mg total) by mouth 4 (four) times daily -  before meals and at bedtime for 14 days. 03/04/19 03/18/19  Orvil Feil, PA-C  furosemide (LASIX) 20 MG tablet Take 1 tablet (20 mg total) by mouth daily. 08/01/18 08/01/19  Cuthriell, Delorise Royals, PA-C  HYDROcodone-acetaminophen (NORCO) 5-325 MG tablet Take 1 tablet by mouth every 6 (six) hours as needed for moderate pain. 07/22/19    Evon Slack, PA-C  losartan (COZAAR) 25 MG tablet Take 1 tablet (25 mg total) by mouth daily. 08/01/18 08/01/19  Cuthriell, Delorise Royals, PA-C  metoCLOPramide (REGLAN) 10 MG tablet Take 1 tablet (10 mg total) by mouth every 8 (eight) hours as needed for nausea or vomiting. 07/30/19 07/29/20  Loleta Rose, MD    Allergies Zofran Frazier Richards hcl], Amoxicillin, Aspirin, Compazine, Dilaudid [hydromorphone hcl], Penicillins, and Proton pump inhibitors  No family history on file.  Social History Social History   Tobacco Use  . Smoking status: Former Smoker    Quit date: 02/16/2014    Years since quitting: 5.4  . Smokeless tobacco: Never Used  Substance Use Topics  . Alcohol use: No  . Drug use: No    Review of Systems Constitutional: General malaise.  No fever/chills Eyes: No visual changes. ENT: No sore throat. Cardiovascular: Denies chest pain. Respiratory: Denies shortness of breath. Gastrointestinal: Generalized abdominal discomfort with nausea and diarrhea for several days. Genitourinary: Negative for dysuria. Musculoskeletal: Negative for neck pain.  Negative for back pain. Integumentary: Several patchy areas of redness that the patient is concerned represents cellulitis on her right arm as well as both of her lower legs. Neurological: Generalized headache.  No focal weakness or numbness.   ____________________________________________   PHYSICAL EXAM:  VITAL SIGNS: ED Triage Vitals  Enc Vitals Group     BP 07/30/19 0513 (!) 163/94     Pulse Rate 07/30/19 0513 93     Resp 07/30/19 0513 20     Temp 07/30/19 0513 98.2 F (36.8 C)     Temp Source 07/30/19 0513 Oral     SpO2 07/30/19 0513 94 %     Weight 07/30/19 0514 107.5 kg (237 lb)     Height 07/30/19 0514 1.626 m (5\' 4" )     Head Circumference --      Peak Flow --      Pain Score 07/30/19 0514 8     Pain Loc --      Pain Edu? --      Excl. in GC? --     Constitutional: Alert and oriented.  Appears  uncomfortable but is not in acute distress. Eyes: Conjunctivae are normal.  Head: Atraumatic. Nose: No congestion/rhinnorhea. Mouth/Throat: Patient is wearing a mask. Neck: No stridor.  No meningeal signs.   Cardiovascular: Normal rate, regular rhythm. Good peripheral circulation. Grossly normal heart sounds. Respiratory: Normal respiratory effort.  No retractions. Gastrointestinal: Soft and nontender. No distention.  Musculoskeletal: No lower extremity tenderness nor edema. No gross deformities of extremities. Neurologic:  Normal speech and language. No gross focal neurologic deficits are appreciated.  Skin:  Skin is warm, dry and intact.  The wound on her right elbow for which she was originally seen is well-appearing and well-healed.  She has a very subtle area of erythema that appears consistent with the square-shaped area  of probable contact dermatitis that was seen a week ago.  She has no joint effusions or evidence of bursitis.  She has no reproducible tenderness with flexion or extension of her right elbow.  Compartments are soft and easily compressible throughout the right arm including the forearm.  She has a patchy area of erythema with several raised bumps (papules) on the dorsal aspect of her right wrist just proximal to the wrist itself.  It is most consistent again with an allergic reaction or even multiple insect bites but there is no evidence of cellulitis or purulent infection.  She has a relatively small area on each lower leg that appears consistent with a compressive abrasion, such as if she wore socks that were too tight or she or someone pressed hard on the area while wearing her socks.  This area is nonblanching but without any surrounding edema or erythema and is again not consistent with cellulitis or other emergent medical condition. Psychiatric: Mood and affect are normal. Speech and behavior are normal.  ____________________________________________   LABS (all labs  ordered are listed, but only abnormal results are displayed)  Labs Reviewed  BASIC METABOLIC PANEL - Abnormal; Notable for the following components:      Result Value   Glucose, Bld 139 (*)    Calcium 8.2 (*)    All other components within normal limits  CULTURE, BLOOD (ROUTINE X 2)  CULTURE, BLOOD (ROUTINE X 2)  LACTIC ACID, PLASMA  CBC WITH DIFFERENTIAL/PLATELET  PROCALCITONIN   ____________________________________________  EKG  No indication for EKG ____________________________________________  RADIOLOGY I, Hinda Kehr, personally viewed and evaluated these images (plain radiographs) as part of my medical decision making, as well as reviewing the written report by the radiologist.  ED MD interpretation: No indication for emergent imaging  Official radiology report(s): No results found.  ____________________________________________   PROCEDURES   Procedure(s) performed (including Critical Care):  Procedures   ____________________________________________   INITIAL IMPRESSION / MDM / [place] / ED COURSE  As part of my medical decision making, I reviewed the following data within the [place] notes reviewed and incorporated, Labs reviewed , Old chart reviewed, Notes from prior ED visits and [place] Controlled Substance Database   Differential diagnosis includes, but is not limited to, allergic reaction both to a contact substance as well as to the medications (clindamycin), insect bites, cellulitis, compartment syndrome, necrotizing fasciitis, ITP, TTP, thrombocytopenia.  In spite of the patient's symptoms, her exam is generally reassuring.  Her vital signs are within normal limits and she is neither febrile nor tachycardic.  Her physical exam is not at all consistent with cellulitis, compartment syndrome, necrotizing fasciitis, or other emergent medical condition.  I explained to her that her symptoms appear much more consistent with  an allergic reaction to something that came in contact with her skin.  I also explained that the nausea and diarrhea she is experiencing is almost certainly secondary to the clindamycin.  It is possible that she is experiencing some skin rash due to the clindamycin but I think this to be less likely given the patchy distribution in which he presents.  There is no indication of SJS/TEN.    Out of an abundance of caution I will check basic lab work as well as sending blood cultures and inflammatory markers such as lactic acid and procalcitonin.  However I explained to her that I would plan for discharge and encouraged her to stop taking the clindamycin and to  take an antihistamine such as Benadryl (which she said she already took at home) or cetirizine.  I encouraged her to follow-up as an outpatient with her regular doctor.  I gave my usual and customary return precautions.  Once her lab work is back and nonemergent she can be discharged.  To treat her headache and nausea I provided metoclopramide 10 mg IV and Toradol 15 mg IV.      Clinical Course as of Jul 30 739  Tue Jul 30, 2019  0739 Lab work is unremarkable with no evidence of acute abnormality.  I will discharge the patient as previously described.  Vital signs remained stable.    Blood cultures are pending and the patient can be called if they come back positive.   [CF]    Clinical Course User Index [CF] Loleta Rose, MD     ____________________________________________  FINAL CLINICAL IMPRESSION(S) / ED DIAGNOSES  Final diagnoses:  Allergic reaction, initial encounter     MEDICATIONS GIVEN DURING THIS VISIT:  Medications  ketorolac (TORADOL) 30 MG/ML injection 15 mg (15 mg Intravenous Given 07/30/19 0643)  metoCLOPramide (REGLAN) injection 10 mg (10 mg Intravenous Given 07/30/19 0643)     ED Discharge Orders         Ordered    metoCLOPramide (REGLAN) 10 MG tablet  Every 8 hours PRN     07/30/19 0741          *Please  note:  [name] was evaluated in Emergency Department on 07/30/2019 for the symptoms described in the history of present illness. She was evaluated in the context of the global COVID-19 pandemic, which necessitated consideration that the patient might be at risk for infection with the SARS-CoV-2 virus that causes COVID-19. Institutional protocols and algorithms that pertain to the evaluation of patients at risk for COVID-19 are in a state of rapid change based on information released by regulatory bodies including the CDC and federal and state organizations. These policies and algorithms were followed during the patient's care in the ED.  Some ED evaluations and interventions may be delayed as a result of limited staffing during the pandemic.*  Note:  This document was prepared using Dragon voice recognition software and may include unintentional dictation errors.   Loleta Rose, MD 07/30/19 [phone]

## 2019-08-04 LAB — CULTURE, BLOOD (ROUTINE X 2)
Culture: NO GROWTH
Culture: NO GROWTH
Special Requests: ADEQUATE
Special Requests: ADEQUATE

## 2019-11-25 ENCOUNTER — Emergency Department: Payer: Self-pay

## 2019-11-25 ENCOUNTER — Other Ambulatory Visit: Payer: Self-pay

## 2019-11-25 ENCOUNTER — Emergency Department
Admission: EM | Admit: 2019-11-25 | Discharge: 2019-11-25 | Disposition: A | Discharge status: Home / Self Care | Payer: Self-pay | Attending: Emergency Medicine | Admitting: Emergency Medicine

## 2019-11-25 ENCOUNTER — Encounter: Payer: Self-pay | Admitting: Emergency Medicine

## 2019-11-25 DIAGNOSIS — Z79899 Other long term (current) drug therapy: Secondary | ICD-10-CM | POA: Insufficient documentation

## 2019-11-25 DIAGNOSIS — Z87891 Personal history of nicotine dependence: Secondary | ICD-10-CM | POA: Insufficient documentation

## 2019-11-25 DIAGNOSIS — M5441 Lumbago with sciatica, right side: Secondary | ICD-10-CM

## 2019-11-25 DIAGNOSIS — I10 Essential (primary) hypertension: Secondary | ICD-10-CM | POA: Insufficient documentation

## 2019-11-25 DIAGNOSIS — J45909 Unspecified asthma, uncomplicated: Secondary | ICD-10-CM | POA: Insufficient documentation

## 2019-11-25 DIAGNOSIS — Z7951 Long term (current) use of inhaled steroids: Secondary | ICD-10-CM | POA: Insufficient documentation

## 2019-11-25 DIAGNOSIS — M549 Dorsalgia, unspecified: Secondary | ICD-10-CM | POA: Insufficient documentation

## 2019-11-25 LAB — COMPREHENSIVE METABOLIC PANEL
ALT: 10 U/L (ref 0–44)
AST: 21 U/L (ref 15–41)
Albumin: 3.4 g/dL — ABNORMAL LOW (ref 3.5–5.0)
Alkaline Phosphatase: 46 U/L (ref 38–126)
Anion gap: 6 (ref 5–15)
BUN: 14 mg/dL (ref 6–20)
CO2: 27 mmol/L (ref 22–32)
Calcium: 8.5 mg/dL — ABNORMAL LOW (ref 8.9–10.3)
Chloride: 107 mmol/L (ref 98–111)
Creatinine, Ser: 0.93 mg/dL (ref 0.44–1.00)
GFR calc Af Amer: >60 mL/min (ref >60)
GFR calc non Af Amer: >60 mL/min (ref >60)
Glucose, Bld: 139 mg/dL — ABNORMAL HIGH (ref 70–99)
Potassium: 3.8 mmol/L (ref 3.5–5.1)
Sodium: 140 mmol/L (ref 135–145)
Total Bilirubin: 0.6 mg/dL (ref 0.3–1.2)
Total Protein: 6 g/dL — ABNORMAL LOW (ref 6.5–8.1)

## 2019-11-25 LAB — CBC WITH DIFFERENTIAL/PLATELET
Abs Immature Granulocytes: 0.04 10*3/uL (ref 0.00–0.07)
Basophils Absolute: 0 10*3/uL (ref 0.0–0.1)
Basophils Relative: 1 %
Eosinophils Absolute: 0.4 10*3/uL (ref 0.0–0.5)
Eosinophils Relative: 5 %
HCT: 41.5 % (ref 36.0–46.0)
Hemoglobin: 13.7 g/dL (ref 12.0–15.0)
Immature Granulocytes: 1 %
Lymphocytes Relative: 24 %
Lymphs Abs: 2.1 10*3/uL (ref 0.7–4.0)
MCH: 30 pg (ref 26.0–34.0)
MCHC: 33 g/dL (ref 30.0–36.0)
MCV: 90.8 fL (ref 80.0–100.0)
Monocytes Absolute: 0.5 10*3/uL (ref 0.1–1.0)
Monocytes Relative: 5 %
Neutro Abs: 5.6 10*3/uL (ref 1.7–7.7)
Neutrophils Relative %: 64 %
Platelets: 234 10*3/uL (ref 150–400)
RBC: 4.57 MIL/uL (ref 3.87–5.11)
RDW: 13.7 % (ref 11.5–15.5)
WBC: 8.7 10*3/uL (ref 4.0–10.5)
nRBC: 0 % (ref 0.0–0.2)

## 2019-11-25 LAB — URINALYSIS, COMPLETE (UACMP) WITH MICROSCOPIC
Bacteria, UA: NONE SEEN
Bilirubin Urine: NEGATIVE
Glucose, UA: NEGATIVE mg/dL
Hgb urine dipstick: NEGATIVE
Ketones, ur: NEGATIVE mg/dL
Leukocytes,Ua: NEGATIVE
Nitrite: NEGATIVE
Protein, ur: NEGATIVE mg/dL
Specific Gravity, Urine: 1.029 (ref 1.005–1.030)
pH: 5 (ref 5.0–8.0)

## 2019-11-25 MED ORDER — PREDNISONE 20 MG PO TABS
60.0000 mg | ORAL_TABLET | Freq: Once | ORAL | Status: AC
  Administered 2019-11-25: 60 mg via ORAL
  Filled 2019-11-25: qty 3

## 2019-11-25 MED ORDER — HYDROCODONE-ACETAMINOPHEN 5-325 MG PO TABS: 1.0000 | ORAL_TABLET | ORAL | 0 refills | Status: DC | PRN

## 2019-11-25 MED ORDER — HYDROMORPHONE HCL 1 MG/ML IJ SOLN
1.0000 mg | Freq: Once | INTRAMUSCULAR | Status: AC
  Administered 2019-11-25: 1 mg via INTRAMUSCULAR
  Filled 2019-11-25: qty 1

## 2019-11-25 MED ORDER — PREDNISONE 10 MG PO TABS: 10.0000 mg | ORAL_TABLET | Freq: Every day | ORAL | 0 refills | Status: DC

## 2019-11-25 MED ORDER — PROMETHAZINE HCL 25 MG PO TABS
25.0000 mg | ORAL_TABLET | Freq: Once | ORAL | Status: AC
  Administered 2019-11-25: 25 mg via ORAL
  Filled 2019-11-25: qty 1

## 2019-11-25 NOTE — ED Provider Notes (Signed)
[facility] Emergency Department Provider Note  Time seen: 7:44 PM  I have reviewed the triage vital signs and the nursing notes.   HISTORY  Chief Complaint Back Pain   HPI Diane Warner is a 54 y.o. female with a past medical history anxiety, arthritis, gastric reflux, degenerative disc disease, hypertension, chronic pain, presents to the emergency department for low back pain radiating down the right leg.  According to the patient she has a history of low back pain occasionally with right leg radiation.  Has been seen by Dr. Previously was told she would need surgery but she did not have insurance and cannot follow-up at that time.  Patient states over the past several days she has also noticed possible incontinence.  States she woke up yesterday and felt a little wet.  Denies any fever, chest pain, shortness of breath, abdominal pain, dysuria.   Past Medical History:  Diagnosis Date  . Anxiety   . Arthritis   . Asthma   . Cellulitis   . Chronic right hip pain   . Degenerative disc disease, thoracic   . GERD (gastroesophageal reflux disease)   . Hypertension   . Irritable bowel syndrome (IBS)   . Scoliosis     Patient Active Problem List   Diagnosis Date Noted  . Gastroenteritis 08/31/2017  . Intractable vomiting with nausea 08/29/2017  . Anxiety 04/11/2016  . Arthritis 04/11/2016  . Asthma 04/11/2016  . Chronic right hip pain 04/11/2016  . Degenerative disc disease, thoracic 04/11/2016  . GERD (gastroesophageal reflux disease) 04/11/2016  . Hypertension 04/11/2016  . Irritable bowel syndrome (IBS) 04/11/2016  . Scoliosis 04/11/2016  . Chronic pain syndrome 04/11/2016  . Chronic bilateral low back pain without sciatica 04/11/2016  . Long term prescription benzodiazepine use 04/11/2016  . Encounter for opiate analgesic use agreement 04/11/2016  . Long term current use of opiate analgesic 04/11/2016  . Long term prescription opiate use  04/11/2016  . Opiate use 04/11/2016  . Obesity, Class II, BMI 35-39.9 04/11/2016    Past Surgical History:  Procedure Laterality Date  . ABDOMINAL HYSTERECTOMY    . CESAREAN SECTION  1985  . CHOLECYSTECTOMY    . TUBAL LIGATION      Prior to Admission medications   Medication Sig Start Date End Date Taking? Authorizing Provider  albuterol (PROVENTIL HFA;VENTOLIN HFA) 108 (90 Base) MCG/ACT inhaler Inhale 2 puffs into the lungs every 6 (six) hours as needed for wheezing or shortness of breath. 06/08/18   Phineas Semen, MD  amLODipine (NORVASC) 5 MG tablet Take 1 tablet (5 mg total) by mouth daily. 08/01/18 08/01/19  Cuthriell, Delorise Royals, PA-C  cyclobenzaprine (FLEXERIL) 10 MG tablet Take 1 tablet (10 mg total) by mouth 3 (three) times daily as needed. 05/28/18   Fisher, Roselyn Bering, PA-C  dicyclomine (BENTYL) 10 MG capsule Take 1 capsule (10 mg total) by mouth 4 (four) times daily -  before meals and at bedtime for 14 days. 03/04/19 03/18/19  Orvil Feil, PA-C  furosemide (LASIX) 20 MG tablet Take 1 tablet (20 mg total) by mouth daily. 08/01/18 08/01/19  Cuthriell, Delorise Royals, PA-C  HYDROcodone-acetaminophen (NORCO) 5-325 MG tablet Take 1 tablet by mouth every 6 (six) hours as needed for moderate pain. 07/22/19   Evon Slack, PA-C  losartan (COZAAR) 25 MG tablet Take 1 tablet (25 mg total) by mouth daily. 08/01/18 08/01/19  Cuthriell, Delorise Royals, PA-C  metoCLOPramide (REGLAN) 10 MG tablet Take 1 tablet (10 mg  total) by mouth every 8 (eight) hours as needed for nausea or vomiting. 07/30/19 07/29/20  Loleta Rose, MD    Allergies  Allergen Reactions  . Zofran [Ondansetron Hcl] Hives and Diarrhea  . Amoxicillin Nausea Only  . Aspirin Other (See Comments)    Gi upset   . Compazine Hives  . Dilaudid [Hydromorphone Hcl] Itching  . Penicillins Nausea Only  . Proton Pump Inhibitors Hives    History reviewed. No pertinent family history.  Social History Social History   Tobacco Use  .  Smoking status: Former Smoker    Quit date: 02/16/2014    Years since quitting: 5.7  . Smokeless tobacco: Never Used  Vaping Use  . Vaping Use: Never used  Substance Use Topics  . Alcohol use: No  . Drug use: No    Review of Systems Constitutional: Negative for fever. Cardiovascular: Negative for chest pain. Respiratory: Negative for shortness of breath. Gastrointestinal: Negative for abdominal pain Genitourinary: Possible overnight incontinence per patient. Musculoskeletal: Significant lower back pain radiating down her right leg. Neurological: Negative for headache All other ROS negative  ____________________________________________   PHYSICAL EXAM:  VITAL SIGNS: ED Triage Vitals  Enc Vitals Group     BP 11/25/19 1641 (!) 144/91     Pulse Rate 11/25/19 1641 91     Resp 11/25/19 1641 18     Temp 11/25/19 1641 98.1 F (36.7 C)     Temp Source 11/25/19 1641 Oral     SpO2 11/25/19 1641 97 %     Weight 11/25/19 1642 238 lb (108 kg)     Height 11/25/19 1642 5\' 4"  (1.626 m)     Head Circumference --      Peak Flow --      Pain Score 11/25/19 1645 10     Pain Loc --      Pain Edu? --      Excl. in GC? --     Constitutional: Alert and oriented. Well appearing and in no distress. Eyes: Normal exam ENT      Head: Normocephalic and atraumatic.      Mouth/Throat: Mucous membranes are moist. Cardiovascular: Normal rate, regular rhythm Respiratory: Normal respiratory effort without tachypnea nor retractions. Breath sounds are clear Gastrointestinal: Soft and nontender. No distention. Musculoskeletal: Nontender with normal range of motion in all extremities. Neurologic:  Normal speech and language. No gross focal neurologic deficits  Skin:  Skin is warm, dry and intact.  Psychiatric: Mood and affect are normal.  ____________________________________________    RADIOLOGY  IMPRESSION:  Minimal retrolisthesis of L5 on S1.   Lumbar spine spondylosis most notable at  L5-S1 with a small right  paracentral disc protrusion contacting the descending right S1 nerve  root without impingement. Mild bilateral neural foraminal narrowing  is noted at this level.   Also at L3-L4 a left lateral recess annular fissure is noted.   ____________________________________________   INITIAL IMPRESSION / ASSESSMENT AND PLAN / ED COURSE  Pertinent labs & imaging results that were available during my care of the patient were reviewed by me and considered in my medical decision making (see chart for details).   Patient presents emergency department for low back pain worsening over the past few days radiating down the right leg.  Patient states she woke up feeling wet yesterday is not sure if she had an episode of incontinence overnight.  No dysuria.  No daytime incontinence.  Patient's lab work is largely within normal limits acutely in blood and  urine.  Patient's MRI shows no significant finding.  Highly suspect sciatica based on the patient's symptoms.  We will dose pain medication start on a steroid taper.  Patient agreeable to plan of care.  Discussed return precautions as well as spine specialist follow-up.  [name] was evaluated in Emergency Department on 11/25/2019 for the symptoms described in the history of present illness. She was evaluated in the context of the global COVID-19 pandemic, which necessitated consideration that the patient might be at risk for infection with the SARS-CoV-2 virus that causes COVID-19. Institutional protocols and algorithms that pertain to the evaluation of patients at risk for COVID-19 are in a state of rapid change based on information released by regulatory bodies including the CDC and federal and state organizations. These policies and algorithms were followed during the patient's care in the ED.  ____________________________________________   FINAL CLINICAL IMPRESSION(S) / ED DIAGNOSES  Back pain   Minna Antis,  MD 11/25/19 1946

## 2019-11-25 NOTE — ED Triage Notes (Signed)
Having low right back pain radiating down leg.  Reports had compressed disc and was supposed to have surgery a few years ago but lost insurance.  Pain got severe last night.  Urinary incontinence this AM.  C/o weakness and numbness in right leg.

## 2019-11-29 ENCOUNTER — Other Ambulatory Visit: Payer: Self-pay

## 2019-11-29 ENCOUNTER — Emergency Department
Admission: EM | Admit: 2019-11-29 | Discharge: 2019-11-30 | Disposition: A | Discharge status: Home / Self Care | Payer: Self-pay | Attending: Emergency Medicine | Admitting: Emergency Medicine

## 2019-11-29 ENCOUNTER — Emergency Department: Payer: Self-pay

## 2019-11-29 ENCOUNTER — Encounter: Payer: Self-pay | Admitting: Emergency Medicine

## 2019-11-29 DIAGNOSIS — J45909 Unspecified asthma, uncomplicated: Secondary | ICD-10-CM | POA: Insufficient documentation

## 2019-11-29 DIAGNOSIS — I10 Essential (primary) hypertension: Secondary | ICD-10-CM | POA: Insufficient documentation

## 2019-11-29 DIAGNOSIS — M79604 Pain in right leg: Secondary | ICD-10-CM | POA: Insufficient documentation

## 2019-11-29 DIAGNOSIS — R079 Chest pain, unspecified: Secondary | ICD-10-CM | POA: Insufficient documentation

## 2019-11-29 LAB — BASIC METABOLIC PANEL
Anion gap: 6 (ref 5–15)
BUN: 13 mg/dL (ref 6–20)
CO2: 26 mmol/L (ref 22–32)
Calcium: 8.3 mg/dL — ABNORMAL LOW (ref 8.9–10.3)
Chloride: 105 mmol/L (ref 98–111)
Creatinine, Ser: 0.92 mg/dL (ref 0.44–1.00)
GFR calc Af Amer: >60 mL/min (ref >60)
GFR calc non Af Amer: >60 mL/min (ref >60)
Glucose, Bld: 121 mg/dL — ABNORMAL HIGH (ref 70–99)
Potassium: 3.2 mmol/L — ABNORMAL LOW (ref 3.5–5.1)
Sodium: 137 mmol/L (ref 135–145)

## 2019-11-29 LAB — CBC
HCT: 43.8 % (ref 36.0–46.0)
Hemoglobin: 15 g/dL (ref 12.0–15.0)
MCH: 30.2 pg (ref 26.0–34.0)
MCHC: 34.2 g/dL (ref 30.0–36.0)
MCV: 88.3 fL (ref 80.0–100.0)
Platelets: 278 10*3/uL (ref 150–400)
RBC: 4.96 MIL/uL (ref 3.87–5.11)
RDW: 13.9 % (ref 11.5–15.5)
WBC: 12.5 10*3/uL — ABNORMAL HIGH (ref 4.0–10.5)
nRBC: 0 % (ref 0.0–0.2)

## 2019-11-29 LAB — TROPONIN I (HIGH SENSITIVITY): Troponin I (High Sensitivity): 5 ng/L (ref <18)

## 2019-11-29 MED ORDER — IOHEXOL 350 MG/ML SOLN
100.0000 mL | Freq: Once | INTRAVENOUS | Status: AC | PRN
  Administered 2019-11-29: 100 mL via INTRAVENOUS

## 2019-11-29 MED ORDER — PROMETHAZINE HCL 25 MG/ML IJ SOLN
25.0000 mg | Freq: Once | INTRAMUSCULAR | Status: AC
  Administered 2019-11-29: 25 mg via INTRAVENOUS
  Filled 2019-11-29: qty 1

## 2019-11-29 MED ORDER — MORPHINE SULFATE (PF) 4 MG/ML IV SOLN
4.0000 mg | Freq: Once | INTRAVENOUS | Status: AC
  Administered 2019-11-29: 4 mg via INTRAVENOUS
  Filled 2019-11-29: qty 1

## 2019-11-29 NOTE — ED Provider Notes (Signed)
ER Provider Note       Time seen: 9:58 PM    I have reviewed the vital signs and the nursing notes.  HISTORY   Chief Complaint Chest Pain    HPI Diane Warner is a 54 y.o. female with a history of anxiety, arthritis, asthma, cellulitis, chronic hip pain, GERD, hypertension, IBS who presents today for complaints of chest pain.  Patient reports chest pain and pressure that goes into her back.  Reports feeling pressure since Tuesday.  EMS administered sublingual nitroglycerin.  She was hypertensive and tachycardic.  She started to feel nausea, was cool clammy and diaphoretic.  She was recently seen here for radicular pain down the right leg.  Past Medical History:  Diagnosis Date  . Anxiety   . Arthritis   . Asthma   . Cellulitis   . Chronic right hip pain   . Degenerative disc disease, thoracic   . GERD (gastroesophageal reflux disease)   . Hypertension   . Irritable bowel syndrome (IBS)   . Scoliosis     Past Surgical History:  Procedure Laterality Date  . ABDOMINAL HYSTERECTOMY    . CESAREAN SECTION  1985  . CHOLECYSTECTOMY    . TUBAL LIGATION      Allergies Zofran [ondansetron hcl], Amoxicillin, Aspirin, Compazine, Dilaudid [hydromorphone hcl], Penicillins, and Proton pump inhibitors  Review of Systems Constitutional: Negative for fever. Cardiovascular: Positive for chest pain Respiratory: Negative for shortness of breath. Gastrointestinal: Negative for abdominal pain, positive for nausea Musculoskeletal: Positive for back pain Skin: Positive for diaphoresis Neurological: Negative for headaches, focal weakness or numbness.  All systems negative/normal/unremarkable except as stated in the HPI  ____________________________________________   PHYSICAL EXAM:  VITAL SIGNS: Vitals:   11/29/19 2144  BP: 111/68  Pulse: 70  Resp: 16  Temp: 98.9 F (37.2 C)  SpO2: 98%   Constitutional: Alert and oriented. Well appearing and in no distress. Eyes:  Conjunctivae are normal. Normal extraocular movements. ENT      Head: Normocephalic and atraumatic.      Nose: No congestion/rhinnorhea.      Mouth/Throat: Mucous membranes are moist.      Neck: No stridor. Cardiovascular: Normal rate, regular rhythm. No murmurs, rubs, or gallops. Respiratory: Normal respiratory effort without tachypnea nor retractions. Breath sounds are clear and equal bilaterally. No wheezes/rales/rhonchi. Gastrointestinal: Soft and nontender. Normal bowel sounds Musculoskeletal: Nontender with normal range of motion in extremities. No lower extremity tenderness nor edema. Neurologic:  Normal speech and language. No gross focal neurologic deficits are appreciated.  Skin:  Skin is warm, dry and intact. No rash noted. Psychiatric: Speech and behavior are normal.  ____________________________________________  EKG: Interpreted by me.  Sinus rhythm, probable left atrial enlargement, normal axis, normal QT  ____________________________________________   LABS (pertinent positives/negatives)  Labs Reviewed  BASIC METABOLIC PANEL - Abnormal; Notable for the following components:      Result Value   Potassium 3.2 (*)    Glucose, Bld 121 (*)    Calcium 8.3 (*)    All other components within normal limits  CBC - Abnormal; Notable for the following components:   WBC 12.5 (*)    All other components within normal limits  TROPONIN I (HIGH SENSITIVITY)    RADIOLOGY  Images were viewed by me Chest x-ray IMPRESSION: No active disease. No obvious dissection on CT angiogram of the chest abdomen pelvis  DIFFERENTIAL DIAGNOSIS  Musculoskeletal pain, GERD, anxiety, unstable angina, MI, PE, dissection  ASSESSMENT AND PLAN  Chest  pain   Plan: The patient had presented for nonspecific chest pressure that radiated into her back. Patient's labs initially have been unremarkable as well as initial imaging.   Daryel November MD    Note: This note was generated in part or  whole with voice recognition software. Voice recognition is usually quite accurate but there are transcription errors that can and very often do occur. I apologize for any typographical errors that were not detected and corrected.     Emily Filbert, MD 11/29/19 2308

## 2019-11-29 NOTE — ED Notes (Signed)
Pt ambulatory to bedside commode.

## 2019-11-29 NOTE — ED Provider Notes (Signed)
-----------------------------------------   11:02 PM on 11/29/2019 -----------------------------------------  Assuming care from Dr. Mayford Knife.  In short, Diane Warner is a 54 y.o. female with a chief complaint of chest pain through to the back.  Refer to the original H&P for additional details.  The current plan of care is to follow up CTA and second troponin.    ----------------------------------------- 1:49 AM on 11/30/2019 -----------------------------------------  Second troponin is essentially unchanged and CTA is reassuring with no sign of an emergent medical condition.  I updated the patient.  Provided Tylenol 1000 mg for ongoing pain.  She was also reporting nausea but she has allergies to essentially every possible antiemetics on declining to provide additional medication.  I updated the patient and her mother about the results and as per her request providing a work note for the next 2 days.  I gave my usual and customary return precautions.   Loleta Rose, MD 11/30/19 [phone]

## 2019-11-29 NOTE — ED Triage Notes (Signed)
Pt presents to ER from home with complaints of chest pain, reports has been feeling with pressure since Tuesday. Pt reports she feels like has indigestion. Per EMS administer 0.4mg  Nitro sublingual, pt was hypertensive 194/100, tachycardic, per EMS pt started to feel nausea, felt cool, clammy diaphoretic. Pt is awake alert oriented in exam room, no respiratory distress noted

## 2019-11-30 LAB — TROPONIN I (HIGH SENSITIVITY): Troponin I (High Sensitivity): 7 ng/L (ref <18)

## 2019-11-30 MED ORDER — ACETAMINOPHEN 500 MG PO TABS
1000.0000 mg | ORAL_TABLET | Freq: Once | ORAL | Status: AC
  Administered 2019-11-30: 1000 mg via ORAL
  Filled 2019-11-30: qty 2

## 2019-11-30 NOTE — ED Notes (Signed)
Pt requesting pain medication for headache and nausea medication. md notified, orders received.

## 2019-11-30 NOTE — ED Notes (Signed)
Report from sylvia, rn.  

## 2019-11-30 NOTE — Discharge Instructions (Signed)
Your workup in the Emergency Department today was reassuring.  We did not find any specific abnormalities.  We recommend you drink plenty of fluids, take your regular medications and/or any new ones prescribed today, and follow up with the doctor(s) listed in these documents as recommended.  Return to the Emergency Department if you develop new or worsening symptoms that concern you.  

## 2020-04-20 ENCOUNTER — Other Ambulatory Visit: Payer: Self-pay

## 2020-04-20 ENCOUNTER — Emergency Department
Admission: EM | Admit: 2020-04-20 | Discharge: 2020-04-20 | Disposition: A | Discharge status: Home / Self Care | Payer: BLUE CROSS/BLUE SHIELD | Attending: Student in an Organized Health Care Education/Training Program | Admitting: Student in an Organized Health Care Education/Training Program

## 2020-04-20 ENCOUNTER — Encounter: Payer: Self-pay | Admitting: Emergency Medicine

## 2020-04-20 DIAGNOSIS — K529 Noninfective gastroenteritis and colitis, unspecified: Secondary | ICD-10-CM | POA: Diagnosis not present

## 2020-04-20 DIAGNOSIS — J45909 Unspecified asthma, uncomplicated: Secondary | ICD-10-CM | POA: Insufficient documentation

## 2020-04-20 DIAGNOSIS — Z20822 Contact with and (suspected) exposure to covid-19: Secondary | ICD-10-CM | POA: Diagnosis not present

## 2020-04-20 DIAGNOSIS — R1032 Left lower quadrant pain: Secondary | ICD-10-CM | POA: Diagnosis present

## 2020-04-20 DIAGNOSIS — Z87891 Personal history of nicotine dependence: Secondary | ICD-10-CM | POA: Diagnosis not present

## 2020-04-20 DIAGNOSIS — Z79899 Other long term (current) drug therapy: Secondary | ICD-10-CM | POA: Diagnosis not present

## 2020-04-20 DIAGNOSIS — I10 Essential (primary) hypertension: Secondary | ICD-10-CM | POA: Insufficient documentation

## 2020-04-20 LAB — URINALYSIS, COMPLETE (UACMP) WITH MICROSCOPIC
Bacteria, UA: NONE SEEN
Bilirubin Urine: NEGATIVE
Glucose, UA: NEGATIVE mg/dL
Hgb urine dipstick: NEGATIVE
Ketones, ur: NEGATIVE mg/dL
Leukocytes,Ua: NEGATIVE
Nitrite: NEGATIVE
Protein, ur: NEGATIVE mg/dL
Specific Gravity, Urine: 1.02 (ref 1.005–1.030)
pH: 6 (ref 5.0–8.0)

## 2020-04-20 LAB — CBC
HCT: 44.6 % (ref 36.0–46.0)
Hemoglobin: 14.5 g/dL (ref 12.0–15.0)
MCH: 30 pg (ref 26.0–34.0)
MCHC: 32.5 g/dL (ref 30.0–36.0)
MCV: 92.3 fL (ref 80.0–100.0)
Platelets: 272 10*3/uL (ref 150–400)
RBC: 4.83 MIL/uL (ref 3.87–5.11)
RDW: 13.2 % (ref 11.5–15.5)
WBC: 9.8 10*3/uL (ref 4.0–10.5)
nRBC: 0 % (ref 0.0–0.2)

## 2020-04-20 LAB — RESP PANEL BY RT-PCR (FLU A&B, COVID) ARPGX2
Influenza A by PCR: NEGATIVE
Influenza B by PCR: NEGATIVE
SARS Coronavirus 2 by RT PCR: NEGATIVE

## 2020-04-20 LAB — COMPREHENSIVE METABOLIC PANEL
ALT: 10 U/L (ref 0–44)
AST: 20 U/L (ref 15–41)
Albumin: 3.5 g/dL (ref 3.5–5.0)
Alkaline Phosphatase: 60 U/L (ref 38–126)
Anion gap: 7 (ref 5–15)
BUN: 11 mg/dL (ref 6–20)
CO2: 30 mmol/L (ref 22–32)
Calcium: 8.6 mg/dL — ABNORMAL LOW (ref 8.9–10.3)
Chloride: 100 mmol/L (ref 98–111)
Creatinine, Ser: 0.82 mg/dL (ref 0.44–1.00)
GFR, Estimated: >60 mL/min (ref >60)
Glucose, Bld: 119 mg/dL — ABNORMAL HIGH (ref 70–99)
Potassium: 3.4 mmol/L — ABNORMAL LOW (ref 3.5–5.1)
Sodium: 137 mmol/L (ref 135–145)
Total Bilirubin: 0.4 mg/dL (ref 0.3–1.2)
Total Protein: 6.4 g/dL — ABNORMAL LOW (ref 6.5–8.1)

## 2020-04-20 LAB — LIPASE, BLOOD: Lipase: 33 U/L (ref 11–51)

## 2020-04-20 MED ORDER — DICYCLOMINE HCL 10 MG PO CAPS: 10.0000 mg | ORAL_CAPSULE | Freq: Four times a day (QID) | ORAL | 0 refills | Status: AC

## 2020-04-20 MED ORDER — SODIUM CHLORIDE 0.9 % IV BOLUS
1000.0000 mL | Freq: Once | INTRAVENOUS | Status: AC
  Administered 2020-04-20: 1000 mL via INTRAVENOUS

## 2020-04-20 MED ORDER — PROMETHAZINE HCL 25 MG PO TABS: 25.0000 mg | ORAL_TABLET | Freq: Four times a day (QID) | ORAL | 0 refills | Status: AC | PRN

## 2020-04-20 MED ORDER — METOCLOPRAMIDE HCL 5 MG/ML IJ SOLN
10.0000 mg | Freq: Once | INTRAMUSCULAR | Status: AC
  Administered 2020-04-20: 10 mg via INTRAVENOUS
  Filled 2020-04-20: qty 2

## 2020-04-20 MED ORDER — DICYCLOMINE HCL 10 MG PO CAPS
10.0000 mg | ORAL_CAPSULE | Freq: Once | ORAL | Status: AC
  Administered 2020-04-20: 10 mg via ORAL
  Filled 2020-04-20: qty 1

## 2020-04-20 NOTE — Discharge Instructions (Signed)
Follow-up with your regular doctor if not improving in 2 days.  Return emergency department worsening. Drink plenty of fluids.  Try Gatorade and water. Take over-the-counter Imodium A-D to help slow the diarrhea Phenergan for nausea/vomiting Bentyl for abdominal cramping If you have fever you can take Tylenol and ibuprofen

## 2020-04-20 NOTE — ED Triage Notes (Signed)
Pt via POV from home. Pt c/o lower abdominal pain, NVD since Thursday. Pt is A&Ox4 and NAD. Pt has a hx of cholecystomy.

## 2020-04-20 NOTE — ED Notes (Signed)
Pt reports abd cramping and pain with diarrhea.  Diarrhea x 3 today   Sx began 5 days ago.  No back pain.  No urinary sx.

## 2020-04-20 NOTE — ED Provider Notes (Signed)
[facility] Emergency Department Provider Note  ____________________________________________   First MD Initiated Contact with Patient 04/20/20 1733     (approximate)  I have reviewed the triage vital signs and the nursing notes.   HISTORY  Chief Complaint Abdominal Pain, Diarrhea, and Emesis    HPI Diane Warner is a 54 y.o. female presents emergency department complaining of left lower quadrant pain along with vomiting and diarrhea for 3 days.  Patient states she has also had a bad headache.  Feels that she needs fluids.  States her diarrhea has been watery and she has had at least 7 episodes today.  No blood in the stool.  No vomiting today.  Patient has a history of a cholecystectomy but still has an appendix.  Negative colonoscopy but she cannot remember when it was.  Does have a history of IBS    Past Medical History:  Diagnosis Date  . Anxiety   . Arthritis   . Asthma   . Cellulitis   . Chronic right hip pain   . Degenerative disc disease, thoracic   . GERD (gastroesophageal reflux disease)   . Hypertension   . Irritable bowel syndrome (IBS)   . Scoliosis     Patient Active Problem List   Diagnosis Date Noted  . Gastroenteritis 08/31/2017  . Intractable vomiting with nausea 08/29/2017  . Anxiety 04/11/2016  . Arthritis 04/11/2016  . Asthma 04/11/2016  . Chronic right hip pain 04/11/2016  . Degenerative disc disease, thoracic 04/11/2016  . GERD (gastroesophageal reflux disease) 04/11/2016  . Hypertension 04/11/2016  . Irritable bowel syndrome (IBS) 04/11/2016  . Scoliosis 04/11/2016  . Chronic pain syndrome 04/11/2016  . Chronic bilateral low back pain without sciatica 04/11/2016  . Long term prescription benzodiazepine use 04/11/2016  . Encounter for opiate analgesic use agreement 04/11/2016  . Long term current use of opiate analgesic 04/11/2016  . Long term prescription opiate use 04/11/2016  . Opiate use 04/11/2016  .  Obesity, Class II, BMI 35-39.9 04/11/2016    Past Surgical History:  Procedure Laterality Date  . ABDOMINAL HYSTERECTOMY    . CESAREAN SECTION  1985  . CHOLECYSTECTOMY    . TUBAL LIGATION      Prior to Admission medications   Medication Sig Start Date End Date Taking? Authorizing Provider  albuterol (PROVENTIL HFA;VENTOLIN HFA) 108 (90 Base) MCG/ACT inhaler Inhale 2 puffs into the lungs every 6 (six) hours as needed for wheezing or shortness of breath. 06/08/18   Phineas Semen, MD  amLODipine (NORVASC) 5 MG tablet Take 1 tablet (5 mg total) by mouth daily. 08/01/18 08/01/19  Cuthriell, Delorise Royals, PA-C  cyclobenzaprine (FLEXERIL) 10 MG tablet Take 1 tablet (10 mg total) by mouth 3 (three) times daily as needed. 05/28/18   Varshini Arrants, Roselyn Bering, PA-C  dicyclomine (BENTYL) 10 MG capsule Take 1 capsule (10 mg total) by mouth 4 (four) times daily for 14 days. 04/20/20 05/04/20  Deondrick Searls, Roselyn Bering, PA-C  furosemide (LASIX) 20 MG tablet Take 1 tablet (20 mg total) by mouth daily. 08/01/18 08/01/19  Cuthriell, Delorise Royals, PA-C  HYDROcodone-acetaminophen (NORCO/VICODIN) 5-325 MG tablet Take 1 tablet by mouth every 4 (four) hours as needed. 11/25/19   Minna Antis, MD  losartan (COZAAR) 25 MG tablet Take 1 tablet (25 mg total) by mouth daily. 08/01/18 08/01/19  Cuthriell, Delorise Royals, PA-C  metoCLOPramide (REGLAN) 10 MG tablet Take 1 tablet (10 mg total) by mouth every 8 (eight) hours as needed for nausea or vomiting.  07/30/19 07/29/20  Loleta Rose, MD  predniSONE (DELTASONE) 10 MG tablet Take 1 tablet (10 mg total) by mouth daily. Day 1-3: take 4 tablets PO daily Day 4-6: take 3 tablets PO daily Day 7-9: take 2 tablets PO daily Day 10-12: take 1 tablet PO daily 11/25/19   Minna Antis, MD  promethazine (PHENERGAN) 25 MG tablet Take 1 tablet (25 mg total) by mouth every 6 (six) hours as needed for nausea or vomiting. 04/20/20   Faythe Ghee, PA-C    Allergies Zofran Frazier Richards hcl], Amoxicillin,  Aspirin, Compazine, Dilaudid [hydromorphone hcl], Penicillins, and Proton pump inhibitors  History reviewed. No pertinent family history.  Social History Social History   Tobacco Use  . Smoking status: Former Smoker    Quit date: 02/16/2014    Years since quitting: 6.1  . Smokeless tobacco: Never Used  Vaping Use  . Vaping Use: Never used  Substance Use Topics  . Alcohol use: No  . Drug use: No    Review of Systems  Constitutional: No fever/chills Eyes: No visual changes. ENT: No sore throat. Respiratory: Denies cough Cardiovascular: Denies chest pain Gastrointestinal: Positive for llq abdominal pain with nausea and diarrhea, no vomiting today Genitourinary: Negative for dysuria. Musculoskeletal: Negative for back pain. Skin: Negative for rash. Psychiatric: no mood changes,     ____________________________________________   PHYSICAL EXAM:  VITAL SIGNS: ED Triage Vitals [04/20/20 1616]  Enc Vitals Group     BP 140/73     Pulse Rate 83     Resp 20     Temp 98.8 F (37.1 C)     Temp Source Oral     SpO2 96 %     Weight 229 lb (103.9 kg)     Height 5\' 3"  (1.6 m)     Head Circumference      Peak Flow      Pain Score      Pain Loc      Pain Edu?      Excl. in GC?     Constitutional: Alert and oriented. Well appearing and in no acute distress. Eyes: Conjunctivae are normal.  Head: Atraumatic. Nose: No congestion/rhinnorhea. Mouth/Throat: Mucous membranes are moist.   Neck:  supple no lymphadenopathy noted Cardiovascular: Normal rate, regular rhythm. Heart sounds are normal Respiratory: Normal respiratory effort.  No retractions, lungs c t a  Abd: soft tender in the llq bs normal all 4 quad GU: deferred Musculoskeletal: FROM all extremities, warm and well perfused Neurologic:  Normal speech and language.  Skin:  Skin is warm, dry and intact. No rash noted. Psychiatric: Mood and affect are normal. Speech and behavior are  normal.  ____________________________________________   LABS (all labs ordered are listed, but only abnormal results are displayed)  Labs Reviewed  COMPREHENSIVE METABOLIC PANEL - Abnormal; Notable for the following components:      Result Value   Potassium 3.4 (*)    Glucose, Bld 119 (*)    Calcium 8.6 (*)    Total Protein 6.4 (*)    All other components within normal limits  URINALYSIS, COMPLETE (UACMP) WITH MICROSCOPIC - Abnormal; Notable for the following components:   Color, Urine YELLOW (*)    APPearance CLEAR (*)    All other components within normal limits  RESP PANEL BY RT-PCR (FLU A&B, COVID) ARPGX2  LIPASE, BLOOD  CBC   ____________________________________________   ____________________________________________  RADIOLOGY    ____________________________________________   PROCEDURES  Procedure(s) performed: No  Procedures  ____________________________________________   INITIAL IMPRESSION / ASSESSMENT AND PLAN / ED COURSE  Pertinent labs & imaging results that were available during my care of the patient were reviewed by me and considered in my medical decision making (see chart for details).   Patient is 54 year old female presents with left lower quadrant pain and diarrhea for 3 to 5 days.  See HPI. Physical exam shows patient's vitals to be normal.  She appears to be stable although she does not feel well.  Abdomen is tender in the left lower quadrant.  Remainder exams unremarkable  DDx: Gastroenteritis, influenza, Covid, diverticulitis  CBC is normal, lipase normal, urinalysis normal, comprehensive metabolic panel shows potassium 3.4, glucose 119  I do feel the labs are reassuring.  Will check for Covid/influenza. Patient will be given 1 L normal saline IV along w with Reglan as patient is allergic to Zofran.  Also will give oral Bentyl.     Covid/influenza test negative  On recheck of the patient she does states she feels better after the  fluids.  Bentyl help with abdominal cramping.  Abdomen is still only tender on the left side.  I explained all of the findings to the patient.  I explained to her that she may stay out of work for couple of days to recoup.  Take over-the-counter Imodium A-D for diarrhea.  Phenergan for nausea.  Drink plenty of fluids.  Return emergency department if worsening.  She is discharged stable condition.  [name] was evaluated in Emergency Department on 04/20/2020 for the symptoms described in the history of present illness. She was evaluated in the context of the global COVID-19 pandemic, which necessitated consideration that the patient might be at risk for infection with the SARS-CoV-2 virus that causes COVID-19. Institutional protocols and algorithms that pertain to the evaluation of patients at risk for COVID-19 are in a state of rapid change based on information released by regulatory bodies including the CDC and federal and state organizations. These policies and algorithms were followed during the patient's care in the ED.    As part of my medical decision making, I reviewed the following data within the electronic MEDICAL RECORD NUMBER Nursing notes reviewed and incorporated, Labs reviewed , Old chart reviewed, Notes from prior ED visits and [place] Controlled Substance Database  ____________________________________________   FINAL CLINICAL IMPRESSION(S) / ED DIAGNOSES  Final diagnoses:  Acute gastroenteritis      NEW MEDICATIONS STARTED DURING THIS VISIT:  New Prescriptions   DICYCLOMINE (BENTYL) 10 MG CAPSULE    Take 1 capsule (10 mg total) by mouth 4 (four) times daily for 14 days.   PROMETHAZINE (PHENERGAN) 25 MG TABLET    Take 1 tablet (25 mg total) by mouth every 6 (six) hours as needed for nausea or vomiting.     Note:  This document was prepared using Dragon voice recognition software and may include unintentional dictation errors.    Faythe Ghee, PA-C 04/20/20 Ladonna Snide, MD 04/20/20 2003

## 2020-06-04 ENCOUNTER — Encounter: Payer: Self-pay | Admitting: *Deleted

## 2020-06-04 ENCOUNTER — Emergency Department
Admission: EM | Admit: 2020-06-04 | Discharge: 2020-06-04 | Disposition: A | Discharge status: Home / Self Care | Payer: No Typology Code available for payment source | Attending: Emergency Medicine | Admitting: Emergency Medicine

## 2020-06-04 ENCOUNTER — Emergency Department: Payer: No Typology Code available for payment source

## 2020-06-04 ENCOUNTER — Other Ambulatory Visit: Payer: Self-pay

## 2020-06-04 DIAGNOSIS — K219 Gastro-esophageal reflux disease without esophagitis: Secondary | ICD-10-CM | POA: Diagnosis not present

## 2020-06-04 DIAGNOSIS — E041 Nontoxic single thyroid nodule: Secondary | ICD-10-CM | POA: Diagnosis not present

## 2020-06-04 DIAGNOSIS — R110 Nausea: Secondary | ICD-10-CM | POA: Diagnosis not present

## 2020-06-04 DIAGNOSIS — Z9049 Acquired absence of other specified parts of digestive tract: Secondary | ICD-10-CM | POA: Diagnosis not present

## 2020-06-04 DIAGNOSIS — R197 Diarrhea, unspecified: Secondary | ICD-10-CM | POA: Insufficient documentation

## 2020-06-04 DIAGNOSIS — Z8719 Personal history of other diseases of the digestive system: Secondary | ICD-10-CM | POA: Diagnosis not present

## 2020-06-04 DIAGNOSIS — Y92410 Unspecified street and highway as the place of occurrence of the external cause: Secondary | ICD-10-CM | POA: Insufficient documentation

## 2020-06-04 DIAGNOSIS — R519 Headache, unspecified: Secondary | ICD-10-CM | POA: Diagnosis not present

## 2020-06-04 DIAGNOSIS — J45909 Unspecified asthma, uncomplicated: Secondary | ICD-10-CM | POA: Diagnosis not present

## 2020-06-04 DIAGNOSIS — R1084 Generalized abdominal pain: Secondary | ICD-10-CM | POA: Insufficient documentation

## 2020-06-04 DIAGNOSIS — Z87891 Personal history of nicotine dependence: Secondary | ICD-10-CM | POA: Insufficient documentation

## 2020-06-04 DIAGNOSIS — I10 Essential (primary) hypertension: Secondary | ICD-10-CM | POA: Diagnosis not present

## 2020-06-04 DIAGNOSIS — Z79899 Other long term (current) drug therapy: Secondary | ICD-10-CM | POA: Diagnosis not present

## 2020-06-04 DIAGNOSIS — R109 Unspecified abdominal pain: Secondary | ICD-10-CM

## 2020-06-04 LAB — URINALYSIS, COMPLETE (UACMP) WITH MICROSCOPIC
Bilirubin Urine: NEGATIVE
Glucose, UA: NEGATIVE mg/dL
Hgb urine dipstick: NEGATIVE
Ketones, ur: NEGATIVE mg/dL
Leukocytes,Ua: NEGATIVE
Nitrite: NEGATIVE
Protein, ur: NEGATIVE mg/dL
Specific Gravity, Urine: 1.019 (ref 1.005–1.030)
pH: 5 (ref 5.0–8.0)

## 2020-06-04 LAB — CBC
HCT: 39.9 % (ref 36.0–46.0)
Hemoglobin: 12.8 g/dL (ref 12.0–15.0)
MCH: 30.2 pg (ref 26.0–34.0)
MCHC: 32.1 g/dL (ref 30.0–36.0)
MCV: 94.1 fL (ref 80.0–100.0)
Platelets: 240 10*3/uL (ref 150–400)
RBC: 4.24 MIL/uL (ref 3.87–5.11)
RDW: 13.8 % (ref 11.5–15.5)
WBC: 9.4 10*3/uL (ref 4.0–10.5)
nRBC: 0 % (ref 0.0–0.2)

## 2020-06-04 LAB — BASIC METABOLIC PANEL
Anion gap: 9 (ref 5–15)
BUN: 13 mg/dL (ref 6–20)
CO2: 25 mmol/L (ref 22–32)
Calcium: 8.6 mg/dL — ABNORMAL LOW (ref 8.9–10.3)
Chloride: 107 mmol/L (ref 98–111)
Creatinine, Ser: 0.92 mg/dL (ref 0.44–1.00)
GFR, Estimated: >60 mL/min (ref >60)
Glucose, Bld: 100 mg/dL — ABNORMAL HIGH (ref 70–99)
Potassium: 3.6 mmol/L (ref 3.5–5.1)
Sodium: 141 mmol/L (ref 135–145)

## 2020-06-04 LAB — LIPASE, BLOOD: Lipase: 29 U/L (ref 11–51)

## 2020-06-04 MED ORDER — METOCLOPRAMIDE HCL 5 MG PO TABS: 5.0000 mg | ORAL_TABLET | Freq: Three times a day (TID) | ORAL | 0 refills | Status: DC

## 2020-06-04 MED ORDER — IOHEXOL 300 MG/ML  SOLN
100.0000 mL | Freq: Once | INTRAMUSCULAR | Status: AC | PRN
  Administered 2020-06-04: 100 mL via INTRAVENOUS
  Filled 2020-06-04: qty 100

## 2020-06-04 MED ORDER — CYCLOBENZAPRINE HCL 5 MG PO TABS: ORAL_TABLET | ORAL | 0 refills | Status: DC

## 2020-06-04 MED ORDER — HYDROCODONE-ACETAMINOPHEN 5-325 MG PO TABS
1.0000 | ORAL_TABLET | Freq: Once | ORAL | Status: AC
  Administered 2020-06-04: 1 via ORAL
  Filled 2020-06-04: qty 1
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
## 2020-06-04 NOTE — ED Notes (Signed)
Patient transported to X-ray 
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
## 2020-06-07 ENCOUNTER — Telehealth: Payer: Self-pay | Admitting: Emergency Medicine

## 2020-06-07 MED ORDER — CYCLOBENZAPRINE HCL 5 MG PO TABS
ORAL_TABLET | ORAL | 0 refills | Status: DC
Start: 2020-06-07 — End: 2020-09-21

## 2020-06-07 NOTE — Telephone Encounter (Cosign Needed)
Patients prescription for flexeril from visit on 1/20 did not make it to pharmacy. Prescription resent.

## 2020-09-21 ENCOUNTER — Encounter: Payer: Self-pay | Admitting: *Deleted

## 2020-09-21 ENCOUNTER — Emergency Department: Payer: Medicare Other

## 2020-09-21 ENCOUNTER — Other Ambulatory Visit: Payer: Self-pay

## 2020-09-21 ENCOUNTER — Emergency Department
Admission: EM | Admit: 2020-09-21 | Discharge: 2020-09-21 | Disposition: A | Discharge status: Home / Self Care | Payer: Medicare Other | Attending: Emergency Medicine | Admitting: Emergency Medicine

## 2020-09-21 DIAGNOSIS — M545 Low back pain, unspecified: Secondary | ICD-10-CM | POA: Diagnosis present

## 2020-09-21 DIAGNOSIS — I10 Essential (primary) hypertension: Secondary | ICD-10-CM | POA: Insufficient documentation

## 2020-09-21 DIAGNOSIS — Z87891 Personal history of nicotine dependence: Secondary | ICD-10-CM | POA: Diagnosis not present

## 2020-09-21 DIAGNOSIS — M5442 Lumbago with sciatica, left side: Secondary | ICD-10-CM | POA: Insufficient documentation

## 2020-09-21 DIAGNOSIS — J45909 Unspecified asthma, uncomplicated: Secondary | ICD-10-CM | POA: Insufficient documentation

## 2020-09-21 DIAGNOSIS — Z79899 Other long term (current) drug therapy: Secondary | ICD-10-CM | POA: Insufficient documentation

## 2020-09-21 MED ORDER — KETOROLAC TROMETHAMINE 10 MG PO TABS: 10.0000 mg | ORAL_TABLET | Freq: Four times a day (QID) | ORAL | 0 refills | Status: AC | PRN

## 2020-09-21 MED ORDER — KETOROLAC TROMETHAMINE 60 MG/2ML IM SOLN
30.0000 mg | Freq: Once | INTRAMUSCULAR | Status: AC
  Administered 2020-09-21: 30 mg via INTRAMUSCULAR
  Filled 2020-09-21: qty 2

## 2020-09-21 MED ORDER — METHOCARBAMOL 500 MG PO TABS
750.0000 mg | ORAL_TABLET | Freq: Once | ORAL | Status: AC
  Administered 2020-09-21: 750 mg via ORAL
  Filled 2020-09-21: qty 2

## 2020-09-21 MED ORDER — METHOCARBAMOL 750 MG PO TABS: 750.0000 mg | ORAL_TABLET | Freq: Four times a day (QID) | ORAL | 0 refills | Status: AC | PRN

## 2020-09-21 NOTE — Discharge Instructions (Signed)
Please take the anti-inflammatory, Toradol as well as a muscle relaxant, Robaxin as prescribed.  You may also use Tylenol, up to 1000 mg 4 times daily as needed for pain.  He is return to the emergency department if you experience any worsening, otherwise follow-up with primary care.

## 2020-09-21 NOTE — ED Notes (Signed)
See triage note  Presents with lower back pain  Hx of herniated disc  States she developed pain upon waking up   Denies any recent trauma or urinary sxs'  Ambulates slowly and with slight limp d/t pain

## 2020-09-21 NOTE — ED Provider Notes (Signed)
[facility] Emergency Department Provider Note  ____________________________________________   Event Date/Time   First MD Initiated Contact with Patient 09/21/20 1718     (approximate)  I have reviewed the triage vital signs and the nursing notes.   HISTORY  Chief Complaint Back Pain  HPI Diane Warner is a 55 y.o. female who presents to the emergency department today for evaluation of acute low back pain radiating into the left hip and leg.  She states that this began this morning when she woke up.  She denies any fall, trauma or other known injury.  States that she has history of herniated disc and this feels similar to those flares.  She denies any difficulty with ambulating but does report increased pain with doing so.  She denies any urinary symptoms, denies any loss of bowel or bladder control, denies any fevers.       Past Medical History:  Diagnosis Date  . Anxiety   . Arthritis   . Asthma   . Cellulitis   . Chronic right hip pain   . Degenerative disc disease, thoracic   . GERD (gastroesophageal reflux disease)   . Hypertension   . Irritable bowel syndrome (IBS)   . Scoliosis     Patient Active Problem List   Diagnosis Date Noted  . Gastroenteritis 08/31/2017  . Intractable vomiting with nausea 08/29/2017  . Anxiety 04/11/2016  . Arthritis 04/11/2016  . Asthma 04/11/2016  . Chronic right hip pain 04/11/2016  . Degenerative disc disease, thoracic 04/11/2016  . GERD (gastroesophageal reflux disease) 04/11/2016  . Hypertension 04/11/2016  . Irritable bowel syndrome (IBS) 04/11/2016  . Scoliosis 04/11/2016  . Chronic pain syndrome 04/11/2016  . Chronic bilateral low back pain without sciatica 04/11/2016  . Long term prescription benzodiazepine use 04/11/2016  . Encounter for opiate analgesic use agreement 04/11/2016  . Long term current use of opiate analgesic 04/11/2016  . Long term prescription opiate use 04/11/2016  .  Opiate use 04/11/2016  . Obesity, Class II, BMI 35-39.9 04/11/2016    Past Surgical History:  Procedure Laterality Date  . ABDOMINAL HYSTERECTOMY    . CESAREAN SECTION  1985  . CHOLECYSTECTOMY    . TUBAL LIGATION      Prior to Admission medications   Medication Sig Start Date End Date Taking? Authorizing Provider  ketorolac (TORADOL) 10 MG tablet Take 1 tablet (10 mg total) by mouth every 6 (six) hours as needed for up to 5 days. 09/21/20 09/26/20 Yes Teesha Ohm, Ruben Gottron, PA  methocarbamol (ROBAXIN-750) 750 MG tablet Take 1 tablet (750 mg total) by mouth 4 (four) times daily as needed for up to 10 days for muscle spasms. 09/21/20 10/01/20 Yes Rithy Mandley, Ruben Gottron, PA  albuterol (PROVENTIL HFA;VENTOLIN HFA) 108 (90 Base) MCG/ACT inhaler Inhale 2 puffs into the lungs every 6 (six) hours as needed for wheezing or shortness of breath. 06/08/18   Phineas Semen, MD  amLODipine (NORVASC) 5 MG tablet Take 1 tablet (5 mg total) by mouth daily. 08/01/18 08/01/19  Cuthriell, Delorise Royals, PA-C  dicyclomine (BENTYL) 10 MG capsule Take 1 capsule (10 mg total) by mouth 4 (four) times daily for 14 days. 04/20/20 05/04/20  Fisher, Roselyn Bering, PA-C  furosemide (LASIX) 20 MG tablet Take 1 tablet (20 mg total) by mouth daily. 08/01/18 08/01/19  Cuthriell, Delorise Royals, PA-C  losartan (COZAAR) 25 MG tablet Take 1 tablet (25 mg total) by mouth daily. 08/01/18 08/01/19  Cuthriell, Delorise Royals, PA-C  promethazine (PHENERGAN)  25 MG tablet Take 1 tablet (25 mg total) by mouth every 6 (six) hours as needed for nausea or vomiting. 04/20/20   Faythe Ghee, PA-C    Allergies Zofran Frazier Richards hcl], Amoxicillin, Aspirin, Compazine, Dilaudid [hydromorphone hcl], Penicillins, and Proton pump inhibitors  No family history on file.  Social History Social History   Tobacco Use  . Smoking status: Former Smoker    Quit date: 02/16/2014    Years since quitting: 6.6  . Smokeless tobacco: Never Used  Vaping Use  . Vaping Use: Never used   Substance Use Topics  . Alcohol use: No  . Drug use: No    Review of Systems Constitutional: No fever/chills Eyes: No visual changes. ENT: No sore throat. Cardiovascular: Denies chest pain. Respiratory: Denies shortness of breath. Gastrointestinal: No abdominal pain.  No nausea, no vomiting.  No diarrhea.  No constipation. Genitourinary: Negative for dysuria. Musculoskeletal: + low back pain. Skin: Negative for rash. Neurological: Negative for headaches, focal weakness or numbness.   ____________________________________________   PHYSICAL EXAM:  VITAL SIGNS: ED Triage Vitals  Enc Vitals Group     BP 09/21/20 1637 (!) 137/91     Pulse Rate 09/21/20 1637 79     Resp 09/21/20 1637 18     Temp 09/21/20 1637 99 F (37.2 C)     Temp Source 09/21/20 1637 Oral     SpO2 09/21/20 1637 97 %     Weight 09/21/20 1634 237 lb (107.5 kg)     Height 09/21/20 1634 5\' 4"  (1.626 m)     Head Circumference --      Peak Flow --      Pain Score 09/21/20 1634 10     Pain Loc --      Pain Edu? --      Excl. in GC? --    Constitutional: Alert and oriented. Well appearing and in no acute distress. Eyes: Conjunctivae are normal. PERRL. EOMI. Head: Atraumatic. Nose: No congestion/rhinnorhea. Mouth/Throat: Mucous membranes are moist.  Oropharynx non-erythematous. Neck: No stridor.   Cardiovascular: Normal rate, regular rhythm. Grossly normal heart sounds.  Good peripheral circulation. Respiratory: Normal respiratory effort.  No retractions. Lungs CTAB. Gastrointestinal: Soft and nontender. No distention. No abdominal bruits. No CVA tenderness. Musculoskeletal: There is tenderness to palpation at the midline of the lumbar spine, roughly from L3 through the sacrum.  She has increased left-sided paraspinal tenderness, minimal right-sided paraspinal tenderness.  She also endorses pain at palpation of the piriformis as well as the left trochanter.  She has 5/5 strength in the bilateral lower  extremities in ankle plantarflexion, dorsiflexion, knee flexion and extension.  No lower extremity edema. Neurologic:  Normal speech and language. No gross focal neurologic deficits are appreciated. Skin:  Skin is warm, dry and intact. No rash noted. Psychiatric: Mood and affect are normal. Speech and behavior are normal.  ____________________________________________  RADIOLOGY I, 11/21/20, personally viewed and evaluated these images (plain radiographs) as part of my medical decision making, as well as reviewing the written report by the radiologist.  ED provider interpretation: X-ray of the lumbar spine demonstrates some mild degenerative changes without any acute abnormality noted  Official radiology report(s): DG Lumbar Spine 2-3 Views  Result Date: 09/21/2020 CLINICAL DATA:  Lower back pain. EXAM: LUMBAR SPINE - 2-3 VIEW COMPARISON:  May 29, 2017 FINDINGS: There is no evidence of lumbar spine fracture. Alignment is normal. Mild endplate sclerosis is seen at the levels of L3-L4 and L4-L5. Intervertebral disc  spaces are maintained. Surgical clips are seen within the right upper quadrant. IMPRESSION: Mild degenerative changes at the levels of L3-L4 and L4-L5. Electronically Signed   By: Aram Candela M.D.   On: 09/21/2020 18:38    ____________________________________________   INITIAL IMPRESSION / ASSESSMENT AND PLAN / ED COURSE  As part of my medical decision making, I reviewed the following data within the electronic MEDICAL RECORD NUMBER Nursing notes reviewed and incorporated, Radiograph reviewed and Notes from prior ED visits        Patient is a 55 year old female who reports to the emergency department for evaluation of acute low back pain with left-sided radiation of symptoms.  He denies any red flag symptoms of fever, loss of bowel or bladder control or weakness in the extremity.  Physical exam is reassuring given that she is not toxic neurovascularly.  She does have  tenderness to palpation of the midline of the lumbar spine and left side paraspinals.  X-ray was obtained and is negative for any acute abnormality.  Will initiate course of anti-inflammatory, muscle relaxant and recommend Tylenol.  Return precautions were discussed and patient stable at this time for outpatient management.      ____________________________________________   FINAL CLINICAL IMPRESSION(S) / ED DIAGNOSES  Final diagnoses:  Acute midline low back pain with left-sided sciatica     ED Discharge Orders         Ordered    ketorolac (TORADOL) 10 MG tablet  Every 6 hours PRN        09/21/20 1855    methocarbamol (ROBAXIN-750) 750 MG tablet  4 times daily PRN        09/21/20 1855          *Please note:  Diane Warner was evaluated in Emergency Department on 09/21/2020 for the symptoms described in the history of present illness. She was evaluated in the context of the global COVID-19 pandemic, which necessitated consideration that the patient might be at risk for infection with the SARS-CoV-2 virus that causes COVID-19. Institutional protocols and algorithms that pertain to the evaluation of patients at risk for COVID-19 are in a state of rapid change based on information released by regulatory bodies including the CDC and federal and state organizations. These policies and algorithms were followed during the patient's care in the ED.  Some ED evaluations and interventions may be delayed as a result of limited staffing during and the pandemic.*   Note:  This document was prepared using Dragon voice recognition software and may include unintentional dictation errors.   Lucy Chris, PA 09/21/20 1907    Merwyn Katos, MD 09/22/20 [phone]

## 2020-09-21 NOTE — ED Triage Notes (Signed)
Pt has lower back pain radiating into left hip and leg.  No known injury.  Sx began today.  Denies urinary sx  Pt alert pt taking otc meds without relief.

## 2021-04-26 ENCOUNTER — Emergency Department: Payer: Medicare Other

## 2021-04-26 ENCOUNTER — Emergency Department
Admission: EM | Admit: 2021-04-26 | Discharge: 2021-04-26 | Disposition: A | Discharge status: Home / Self Care | Payer: Medicare Other | Attending: Emergency Medicine | Admitting: Emergency Medicine

## 2021-04-26 ENCOUNTER — Other Ambulatory Visit: Payer: Self-pay

## 2021-04-26 DIAGNOSIS — Z79899 Other long term (current) drug therapy: Secondary | ICD-10-CM | POA: Insufficient documentation

## 2021-04-26 DIAGNOSIS — Z7982 Long term (current) use of aspirin: Secondary | ICD-10-CM | POA: Insufficient documentation

## 2021-04-26 DIAGNOSIS — J45909 Unspecified asthma, uncomplicated: Secondary | ICD-10-CM | POA: Diagnosis not present

## 2021-04-26 DIAGNOSIS — I10 Essential (primary) hypertension: Secondary | ICD-10-CM | POA: Insufficient documentation

## 2021-04-26 DIAGNOSIS — L03115 Cellulitis of right lower limb: Secondary | ICD-10-CM | POA: Insufficient documentation

## 2021-04-26 DIAGNOSIS — R600 Localized edema: Secondary | ICD-10-CM | POA: Diagnosis present

## 2021-04-26 MED ORDER — CEPHALEXIN 500 MG PO CAPS
500.0000 mg | ORAL_CAPSULE | Freq: Once | ORAL | Status: AC
  Administered 2021-04-26: 500 mg via ORAL
  Filled 2021-04-26: qty 1

## 2021-04-26 MED ORDER — HYDROCODONE-ACETAMINOPHEN 5-325 MG PO TABS
1.0000 | ORAL_TABLET | Freq: Once | ORAL | Status: AC
  Administered 2021-04-26: 1 via ORAL
  Filled 2021-04-26: qty 1

## 2021-04-26 MED ORDER — DOXYCYCLINE HYCLATE 100 MG PO TABS
100.0000 mg | ORAL_TABLET | Freq: Once | ORAL | Status: AC
  Administered 2021-04-26: 100 mg via ORAL
  Filled 2021-04-26: qty 1

## 2021-04-26 MED ORDER — CEPHALEXIN 500 MG PO CAPS: 500.0000 mg | ORAL_CAPSULE | Freq: Four times a day (QID) | ORAL | 0 refills | Status: AC

## 2021-04-26 MED ORDER — DOXYCYCLINE MONOHYDRATE 100 MG PO TABS: 100.0000 mg | ORAL_TABLET | Freq: Two times a day (BID) | ORAL | 0 refills | Status: AC

## 2021-04-26 MED ORDER — HYDROCODONE-ACETAMINOPHEN 5-325 MG PO TABS: 1.0000 | ORAL_TABLET | Freq: Four times a day (QID) | ORAL | 0 refills | Status: AC | PRN

## 2021-04-26 NOTE — ED Provider Notes (Signed)
[facility]-EMERGENCY DEPARTMENT  ____________________________________________  Time seen: Approximately 3:12 PM  I have reviewed the triage vital signs and the nursing notes.   HISTORY  Chief Complaint Abscess   Historian Patient     HPI Diane Warner is a 55 y.o. female presents to the emergency department with thigh erythema and edema.  Patient states that symptoms are barely noticeable when she is in a supine position but when she stands the erythema becomes more noticeable.  She states that she can barely lie on the affected side due to her discomfort.  She denies falls or mechanisms of trauma.  She states that she has had cellulitis in the past but is never had an abscess.  She denies pleuritic chest pain, shortness of breath or chest tightness.  Denies a history of DVT or PE in the past.   Past Medical History:  Diagnosis Date   Anxiety    Arthritis    Asthma    Cellulitis    Chronic right hip pain    Degenerative disc disease, thoracic    GERD (gastroesophageal reflux disease)    Hypertension    Irritable bowel syndrome (IBS)    Scoliosis      Immunizations up to date:  Yes.     Past Medical History:  Diagnosis Date   Anxiety    Arthritis    Asthma    Cellulitis    Chronic right hip pain    Degenerative disc disease, thoracic    GERD (gastroesophageal reflux disease)    Hypertension    Irritable bowel syndrome (IBS)    Scoliosis     Patient Active Problem List   Diagnosis Date Noted   Gastroenteritis 08/31/2017   Intractable vomiting with nausea 08/29/2017   Anxiety 04/11/2016   Arthritis 04/11/2016   Asthma 04/11/2016   Chronic right hip pain 04/11/2016   Degenerative disc disease, thoracic 04/11/2016   GERD (gastroesophageal reflux disease) 04/11/2016   Hypertension 04/11/2016   Irritable bowel syndrome (IBS) 04/11/2016   Scoliosis 04/11/2016   Chronic pain syndrome 04/11/2016   Chronic bilateral low back pain without sciatica  04/11/2016   Long term prescription benzodiazepine use 04/11/2016   Encounter for opiate analgesic use agreement 04/11/2016   Long term current use of opiate analgesic 04/11/2016   Long term prescription opiate use 04/11/2016   Opiate use 04/11/2016   Obesity, Class II, BMI 35-39.9 04/11/2016    Past Surgical History:  Procedure Laterality Date   ABDOMINAL HYSTERECTOMY     [place]      Prior to Admission medications   Medication Sig Start Date End Date Taking? Authorizing Provider  HYDROcodone-acetaminophen (NORCO) 5-325 MG tablet Take 1 tablet by mouth every 6 (six) hours as needed for up to 2 days. 04/26/21 04/28/21 Yes Vallarie Mare M, PA-C  albuterol (PROVENTIL HFA;VENTOLIN HFA) 108 (90 Base) MCG/ACT inhaler Inhale 2 puffs into the lungs every 6 (six) hours as needed for wheezing or shortness of breath. 06/08/18   Nance Pear, MD  amLODipine (NORVASC) 5 MG tablet Take 1 tablet (5 mg total) by mouth daily. 08/01/18 08/01/19  Cuthriell, Charline Bills, PA-C  cephALEXin (KEFLEX) 500 MG capsule Take 1 capsule (500 mg total) by mouth 4 (four) times daily for 7 days. 04/26/21 05/03/21 Yes Vallarie Mare M, PA-C  dicyclomine (BENTYL) 10 MG capsule Take 1 capsule (10 mg total) by mouth 4 (four) times daily for 14 days. 04/20/20 05/04/20  Faythe Ghee, PA-C  doxycycline (ADOXA) 100 MG tablet Take 1 tablet (100 mg total) by mouth 2 (two) times daily for 7 days. 04/26/21 05/03/21 Yes Pia Mau M, PA-C  furosemide (LASIX) 20 MG tablet Take 1 tablet (20 mg total) by mouth daily. 08/01/18 08/01/19  Cuthriell, Delorise Royals, PA-C  losartan (COZAAR) 25 MG tablet Take 1 tablet (25 mg total) by mouth daily. 08/01/18 08/01/19  Cuthriell, Delorise Royals, PA-C  promethazine (PHENERGAN) 25 MG tablet Take 1 tablet (25 mg total) by mouth every 6 (six) hours as needed for nausea or vomiting. 04/20/20   Faythe Ghee, PA-C    Allergies Zofran Frazier Richards hcl],  Amoxicillin, Aspirin, Compazine, Dilaudid [hydromorphone hcl], Penicillins, and Proton pump inhibitors  History reviewed. No pertinent family history.  Social History Social History   Tobacco Use   Smoking status: Former    Types: Cigarettes    Quit date: 02/16/2014    Years since quitting: 7.1   Smokeless tobacco: Never  Vaping Use   Vaping Use: Never used  Substance Use Topics   Alcohol use: No   Drug use: No     Review of Systems  Constitutional: No fever/chills Eyes:  No discharge ENT: No upper respiratory complaints. Respiratory: no cough. No SOB/ use of accessory muscles to breath Gastrointestinal:   No nausea, no vomiting.  No diarrhea.  No constipation. Musculoskeletal: Negative for musculoskeletal pain. Skin: Patient has erythema and edema of right thigh.   ____________________________________________   PHYSICAL EXAM:  VITAL SIGNS: ED Triage Vitals  Enc Vitals Group     BP 04/26/21 1230 125/75     Pulse Rate 04/26/21 1230 87     Resp 04/26/21 1230 18     Temp 04/26/21 1230 98.3 F (36.8 C)     Temp Source 04/26/21 1230 Oral     SpO2 04/26/21 1230 97 %     Weight 04/26/21 1231 245 lb (111.1 kg)     Height 04/26/21 1231 5\' 4"  (1.626 m)     Head Circumference --      Peak Flow --      Pain Score 04/26/21 1231 8     Pain Loc --      Pain Edu? --      Excl. in GC? --      Constitutional: Alert and oriented. Well appearing and in no acute distress. Eyes: Conjunctivae are normal. PERRL. EOMI. Head: Atraumatic. ENT:      Nose: No congestion/rhinnorhea.      Mouth/Throat: Mucous membranes are moist.  Neck: No stridor.  No cervical spine tenderness to palpation. Cardiovascular: Normal rate, regular rhythm. Normal S1 and S2.  Good peripheral circulation. Respiratory: Normal respiratory effort without tachypnea or retractions. Lungs CTAB. Good air entry to the bases with no decreased or absent breath sounds Gastrointestinal: Bowel sounds x 4 quadrants.  Soft and nontender to palpation. No guarding or rigidity. No distention. Musculoskeletal: Full range of motion to all extremities. No obvious deformities noted Neurologic:  Normal for age. No gross focal neurologic deficits are appreciated.  Skin: Patient has mild erythema approximately 12 cm x 5 cm of the lateral right thigh and significant induration but no palpable fluctuance. Psychiatric: Mood and affect are normal for age. Speech and behavior are normal.   ____________________________________________   LABS (all labs ordered are listed, but only abnormal results are displayed)  Labs Reviewed - No data to display ____________________________________________  EKG   ____________________________________________  RADIOLOGY   US Venous Img  Lower Unilateral Right  Result Date: 04/26/2021 CLINICAL DATA:  55 year old female with history of right lower extremity swelling. EXAM: RIGHT LOWER EXTREMITY VENOUS DOPPLER ULTRASOUND TECHNIQUE: Gray-scale sonography with graded compression, as well as color Doppler and duplex ultrasound were performed to evaluate the right lower extremity deep venous systems from the level of the common femoral vein and including the common femoral, femoral, profunda femoral, popliteal and calf veins including the posterior tibial, peroneal and gastrocnemius veins when visible. Spectral Doppler was utilized to evaluate flow at rest and with distal augmentation maneuvers in the common femoral, femoral and popliteal veins. The contralateral common femoral vein was also evaluated for comparison. COMPARISON:  None. FINDINGS: RIGHT LOWER EXTREMITY Common Femoral Vein: No evidence of thrombus. Normal compressibility, respiratory phasicity and response to augmentation. Central Greater Saphenous Vein: No evidence of thrombus. Normal compressibility and flow on color Doppler imaging. Central Profunda Femoral Vein: No evidence of thrombus. Normal compressibility and flow on color  Doppler imaging. Femoral Vein: No evidence of thrombus. Normal compressibility, respiratory phasicity and response to augmentation. Popliteal Vein: No evidence of thrombus. Normal compressibility, respiratory phasicity and response to augmentation. Calf Veins: No evidence of thrombus. Normal compressibility and flow on color Doppler imaging. Other Findings:  None. LEFT LOWER EXTREMITY Common Femoral Vein: No evidence of thrombus. Normal compressibility, respiratory phasicity and response to augmentation. IMPRESSION: No evidence of right lower extremity deep venous thrombosis. Ruthann Cancer, MD [facility] Radiology Electronically Signed   By: Ruthann Cancer M.D.   On: 04/26/2021 16:02    ____________________________________________    PROCEDURES  Procedure(s) performed:     Procedures     Medications  HYDROcodone-acetaminophen (NORCO/VICODIN) 5-325 MG per tablet 1 tablet (1 tablet Oral Given 04/26/21 1631)  doxycycline (VIBRA-TABS) tablet 100 mg (100 mg Oral Given 04/26/21 1631)  cephALEXin (KEFLEX) capsule 500 mg (500 mg Oral Given 04/26/21 1632)     ____________________________________________   INITIAL IMPRESSION / ASSESSMENT AND PLAN / ED COURSE  Pertinent labs & imaging results that were available during my care of the patient were reviewed by me and considered in my medical decision making (see chart for details).      Assessment and Plan: Leg erythema Leg swelling.  55 year old female presents to the emergency department with any erythema of the proximal thigh with some induration but no palpable fluctuance.  Differential diagnosis included DVT, cellulitis, abscess.  Physical exam is not suggestive of abscess.  Erythema is faint and there is no fluctuance.  Venous ultrasound showed no signs of DVT.  We will treat for cellulitis with doxycycline and Keflex.  I reviewed the medical record and see where patient has tolerated  Rocephin well in the past.  Low concern for Keflex allergy given this tolerance in the past.  Return precautions were given to return in 3 days if symptoms do not seem to be improving.  All patient questions were answered.   ____________________________________________  FINAL CLINICAL IMPRESSION(S) / ED DIAGNOSES  Final diagnoses:  Cellulitis of right lower extremity      NEW MEDICATIONS STARTED DURING THIS VISIT:  ED Discharge Orders          Ordered    doxycycline (ADOXA) 100 MG tablet  2 times daily        04/26/21 1610    cephALEXin (KEFLEX) 500 MG capsule  4 times daily        04/26/21 1610    HYDROcodone-acetaminophen (NORCO) 5-325 MG tablet  Every 6 hours  PRN        04/26/21 1612                This chart was dictated using voice recognition software/Dragon. Despite best efforts to proofread, errors can occur which can change the meaning. Any change was purely unintentional.     Orvil Feil, PA-C 04/26/21 1653    Dionne Bucy, MD 04/26/21 1925

## 2021-04-26 NOTE — Discharge Instructions (Addendum)
Take Keflex four times daily for the next seven days. Take Doxycycline twice daily for the next seven days.  

## 2021-04-26 NOTE — ED Triage Notes (Addendum)
Pt c/o right buttock pain with swelling and redness for the past 2-3 days, denies any recent injury

## 2021-04-26 NOTE — ED Notes (Signed)
Pt able to ambulate from wc to stretcher independently with pain. States that pain has gotten worse recently.

## 2021-09-26 ENCOUNTER — Emergency Department
Admission: EM | Admit: 2021-09-26 | Discharge: 2021-09-26 | Disposition: A | Discharge status: Home / Self Care | Payer: Medicare Other | Attending: Emergency Medicine | Admitting: Emergency Medicine

## 2021-09-26 ENCOUNTER — Other Ambulatory Visit: Payer: Self-pay

## 2021-09-26 ENCOUNTER — Emergency Department: Payer: Medicare Other

## 2021-09-26 DIAGNOSIS — R1031 Right lower quadrant pain: Secondary | ICD-10-CM | POA: Diagnosis present

## 2021-09-26 DIAGNOSIS — K760 Fatty (change of) liver, not elsewhere classified: Secondary | ICD-10-CM | POA: Insufficient documentation

## 2021-09-26 DIAGNOSIS — R197 Diarrhea, unspecified: Secondary | ICD-10-CM | POA: Diagnosis not present

## 2021-09-26 DIAGNOSIS — R109 Unspecified abdominal pain: Secondary | ICD-10-CM

## 2021-09-26 LAB — COMPREHENSIVE METABOLIC PANEL
ALT: 12 U/L (ref 0–44)
AST: 23 U/L (ref 15–41)
Albumin: 3.8 g/dL (ref 3.5–5.0)
Alkaline Phosphatase: 55 U/L (ref 38–126)
Anion gap: 8 (ref 5–15)
BUN: 15 mg/dL (ref 6–20)
CO2: 29 mmol/L (ref 22–32)
Calcium: 9 mg/dL (ref 8.9–10.3)
Chloride: 102 mmol/L (ref 98–111)
Creatinine, Ser: 1 mg/dL (ref 0.44–1.00)
GFR, Estimated: >60 mL/min (ref >60)
Glucose, Bld: 120 mg/dL — ABNORMAL HIGH (ref 70–99)
Potassium: 3.3 mmol/L — ABNORMAL LOW (ref 3.5–5.1)
Sodium: 139 mmol/L (ref 135–145)
Total Bilirubin: 0.5 mg/dL (ref 0.3–1.2)
Total Protein: 6.9 g/dL (ref 6.5–8.1)

## 2021-09-26 LAB — URINALYSIS, ROUTINE W REFLEX MICROSCOPIC
Bilirubin Urine: NEGATIVE
Glucose, UA: NEGATIVE mg/dL
Hgb urine dipstick: NEGATIVE
Ketones, ur: NEGATIVE mg/dL
Leukocytes,Ua: NEGATIVE
Nitrite: NEGATIVE
Protein, ur: NEGATIVE mg/dL
Specific Gravity, Urine: 1.018 (ref 1.005–1.030)
pH: 7 (ref 5.0–8.0)

## 2021-09-26 LAB — CBC
HCT: 47.3 % — ABNORMAL HIGH (ref 36.0–46.0)
Hemoglobin: 15.2 g/dL — ABNORMAL HIGH (ref 12.0–15.0)
MCH: 29.7 pg (ref 26.0–34.0)
MCHC: 32.1 g/dL (ref 30.0–36.0)
MCV: 92.4 fL (ref 80.0–100.0)
Platelets: 264 10*3/uL (ref 150–400)
RBC: 5.12 MIL/uL — ABNORMAL HIGH (ref 3.87–5.11)
RDW: 13.3 % (ref 11.5–15.5)
WBC: 10.5 10*3/uL (ref 4.0–10.5)
nRBC: 0 % (ref 0.0–0.2)

## 2021-09-26 LAB — LIPASE, BLOOD: Lipase: 35 U/L (ref 11–51)

## 2021-09-26 LAB — POC URINE PREG, ED: Preg Test, Ur: NEGATIVE

## 2021-09-26 MED ORDER — MORPHINE SULFATE (PF) 4 MG/ML IV SOLN
4.0000 mg | Freq: Once | INTRAVENOUS | Status: AC
  Administered 2021-09-26: 4 mg via INTRAVENOUS
  Filled 2021-09-26: qty 1

## 2021-09-26 MED ORDER — IOHEXOL 350 MG/ML SOLN
100.0000 mL | Freq: Once | INTRAVENOUS | Status: AC | PRN
  Administered 2021-09-26: 100 mL via INTRAVENOUS
  Filled 2021-09-26: qty 100

## 2021-09-26 NOTE — Discharge Instructions (Addendum)
You can take 650 mg of Tylenol every six hours as needed for pain.  

## 2021-09-26 NOTE — ED Provider Notes (Signed)
? ?[facility] ?Provider Note ? ?Patient Contact: 8:03 PM (approximate) ? ? ?History  ? ?Abdominal Pain and Groin Pain (R groin wraps to R back) ? ? ?HPI ? ?Diane Warner is a 56 y.o. female presents to the emergency department with right lower quadrant abdominal pain that radiates to the back.  Patient reports that her pain started yesterday.  She denies dysuria or increased urinary frequency.  Denies history of nephrolithiasis.  She has had some mild diarrhea over the past 2 days.  No chest pain, chest tightness or shortness of breath. ? ?  ? ? ?Physical Exam  ? ?Triage Vital Signs: ?ED Triage Vitals  ?Enc Vitals Group  ?   BP 09/26/21 1836 (!) 136/113  ?   Pulse Rate 09/26/21 1836 (!) 103  ?   Resp 09/26/21 1836 (!) 22  ?   Temp 09/26/21 1836 98.3 ?F (36.8 ?C)  ?   Temp Source 09/26/21 1836 Oral  ?   SpO2 09/26/21 1836 95 %  ?   Weight 09/26/21 1837 245 lb (111.1 kg)  ?   Height 09/26/21 1837 5\' 4"  (1.626 m)  ?   Head Circumference --   ?   Peak Flow --   ?   Pain Score 09/26/21 1836 9  ?   Pain Loc --   ?   Pain Edu? --   ?   Excl. in [place]? --   ? ? ?Most recent vital signs: ?Vitals:  ? 09/26/21 1836 09/26/21 2124  ?BP: (!) 136/113 (!) 106/53  ?Pulse: (!) 103 87  ?Resp: (!) 22 16  ?Temp: 98.3 ?F (36.8 ?C)   ?SpO2: 95% 95%  ? ? ? ?General: Alert and in no acute distress. ?Eyes:  PERRL. EOMI. ?Head: No acute traumatic findings ?ENT: ?     Ears: Tms are pearly.  ?     Nose: No congestion/rhinnorhea. ?     Mouth/Throat: Mucous membranes are moist. ?Neck: No stridor. No cervical spine tenderness to palpation. ?Cardiovascular:  Good peripheral perfusion ?Respiratory: Normal respiratory effort without tachypnea or retractions. Lungs CTAB. Good air entry to the bases with no decreased or absent breath sounds. ?Gastrointestinal: Bowel sounds ?4 quadrants.  Patient has right lower quadrant tenderness to palpation with guarding.  No palpable masses. No distention. No CVA  tenderness. ?Musculoskeletal: Full range of motion to all extremities.  ?Neurologic:  No gross focal neurologic deficits are appreciated.  ?Skin:   No rash noted ?Other: ? ? ?ED Results / Procedures / Treatments  ? ?Labs ?(all labs ordered are listed, but only abnormal results are displayed) ?Labs Reviewed  ?COMPREHENSIVE METABOLIC PANEL - Abnormal; Notable for the following components:  ?    Result Value  ? Potassium 3.3 (*)   ? Glucose, Bld 120 (*)   ? All other components within normal limits  ?CBC - Abnormal; Notable for the following components:  ? RBC 5.12 (*)   ? Hemoglobin 15.2 (*)   ? HCT 47.3 (*)   ? All other components within normal limits  ?URINALYSIS, ROUTINE W REFLEX MICROSCOPIC - Abnormal; Notable for the following components:  ? Color, Urine YELLOW (*)   ? APPearance CLOUDY (*)   ? All other components within normal limits  ?LIPASE, BLOOD  ?POC URINE PREG, ED  ? ? ? ? ? ? ?RADIOLOGY ? ?I personally viewed and evaluated these images as part of my medical decision making, as well as reviewing the written report by the radiologist. ? ?  ED Provider Interpretation: I personally interpreted CT abdomen pelvis.  Patient has evidence of fatty liver no other acute findings. ? ? ?PROCEDURES: ? ?Critical Care performed: No ? ?Procedures ? ? ?MEDICATIONS ORDERED IN ED: ?Medications  ?morphine (PF) 4 MG/ML injection 4 mg (4 mg Intravenous Given 09/26/21 2023)  ?iohexol (OMNIPAQUE) 350 MG/ML injection 100 mL (100 mLs Intravenous Contrast Given 09/26/21 2036)  ? ? ? ?IMPRESSION / MDM / ASSESSMENT AND PLAN / ED COURSE  ?I reviewed the triage vital signs and the nursing notes. ?             ?               ? ?Assessment and plan:  ?Abdominal pain:  ?56 year old female presents to the emergency department with right mid abdominal pain that seem to radiate to her groin. ? ?Vital signs are reassuring at triage.  On physical exam, patient was uncomfortable with right mid abdominal tenderness with guarding. ?CBC, CMP, lipase  and urinalysis largely unremarkable.  CT abdomen pelvis shows evidence of fatty liver but no other acute findings.  Patient was advised to follow-up with primary care regarding her abdominal pain.  Abdominal pain improved significantly after 1 dose of more ? ?FINAL CLINICAL IMPRESSION(S) / ED DIAGNOSES  ? ?Final diagnoses:  ?Abdominal pain, unspecified abdominal location  ? ? ? ?Rx / DC Orders  ? ?ED Discharge Orders   ? ? None  ? ?  ? ? ? ?Note:  This document was prepared using Dragon voice recognition software and may include unintentional dictation errors. ?  ?Orvil Feil, PA-C ?09/26/21 2151 ? ?  ?Georga Hacking, MD ?09/28/21 0011 ? ?

## 2021-09-26 NOTE — ED Triage Notes (Signed)
Pt to ED for R sided groin pain that wraps to R lower abdomen and R lower back that started yesterday. Pt denies urinary symptoms or hx renal stones. Endorses diarrhea for 2 days, several times per day.  ?

## 2022-01-25 ENCOUNTER — Other Ambulatory Visit: Payer: Self-pay | Admitting: Adult Medicine

## 2022-01-25 DIAGNOSIS — M8589 Other specified disorders of bone density and structure, multiple sites: Secondary | ICD-10-CM

## 2022-01-25 DIAGNOSIS — Z1231 Encounter for screening mammogram for malignant neoplasm of breast: Secondary | ICD-10-CM

## 2022-03-01 ENCOUNTER — Ambulatory Visit: Payer: Medicare Other

## 2022-03-08 ENCOUNTER — Other Ambulatory Visit: Payer: Medicare Other

## 2022-04-05 ENCOUNTER — Other Ambulatory Visit: Payer: Medicare Other

## 2022-04-15 ENCOUNTER — Ambulatory Visit
Admission: RE | Admit: 2022-04-15 | Discharge: 2022-04-15 | Disposition: A | Discharge status: Home / Self Care | Payer: Medicare Other | Source: Ambulatory Visit | Attending: Adult Medicine | Admitting: Adult Medicine

## 2022-04-15 DIAGNOSIS — Z1231 Encounter for screening mammogram for malignant neoplasm of breast: Secondary | ICD-10-CM

## 2022-05-26 DIAGNOSIS — Z79899 Other long term (current) drug therapy: Secondary | ICD-10-CM | POA: Diagnosis not present

## 2022-05-26 DIAGNOSIS — R7303 Prediabetes: Secondary | ICD-10-CM | POA: Diagnosis not present

## 2022-05-26 DIAGNOSIS — F1120 Opioid dependence, uncomplicated: Secondary | ICD-10-CM | POA: Diagnosis not present

## 2022-05-26 DIAGNOSIS — M549 Dorsalgia, unspecified: Secondary | ICD-10-CM | POA: Diagnosis not present

## 2022-05-26 DIAGNOSIS — Z0000 Encounter for general adult medical examination without abnormal findings: Secondary | ICD-10-CM | POA: Diagnosis not present

## 2022-05-26 DIAGNOSIS — Z0130 Encounter for examination of blood pressure without abnormal findings: Secondary | ICD-10-CM | POA: Diagnosis not present

## 2022-05-26 DIAGNOSIS — M542 Cervicalgia: Secondary | ICD-10-CM | POA: Diagnosis not present

## 2022-05-26 DIAGNOSIS — F419 Anxiety disorder, unspecified: Secondary | ICD-10-CM | POA: Diagnosis not present

## 2022-05-26 DIAGNOSIS — Z6839 Body mass index (BMI) 39.0-39.9, adult: Secondary | ICD-10-CM | POA: Diagnosis not present

## 2022-05-26 DIAGNOSIS — F32A Depression, unspecified: Secondary | ICD-10-CM | POA: Diagnosis not present

## 2022-05-31 ENCOUNTER — Other Ambulatory Visit: Payer: Medicare Other

## 2022-06-07 DIAGNOSIS — R1319 Other dysphagia: Secondary | ICD-10-CM | POA: Diagnosis not present

## 2022-06-07 DIAGNOSIS — K219 Gastro-esophageal reflux disease without esophagitis: Secondary | ICD-10-CM | POA: Diagnosis not present

## 2022-06-07 DIAGNOSIS — R110 Nausea: Secondary | ICD-10-CM | POA: Diagnosis not present

## 2022-06-13 DIAGNOSIS — F331 Major depressive disorder, recurrent, moderate: Secondary | ICD-10-CM | POA: Diagnosis not present

## 2022-06-13 DIAGNOSIS — F411 Generalized anxiety disorder: Secondary | ICD-10-CM | POA: Diagnosis not present

## 2022-06-13 DIAGNOSIS — F4312 Post-traumatic stress disorder, chronic: Secondary | ICD-10-CM | POA: Diagnosis not present

## 2022-06-22 DIAGNOSIS — R7303 Prediabetes: Secondary | ICD-10-CM | POA: Diagnosis not present

## 2022-06-22 DIAGNOSIS — M542 Cervicalgia: Secondary | ICD-10-CM | POA: Diagnosis not present

## 2022-06-22 DIAGNOSIS — Z79899 Other long term (current) drug therapy: Secondary | ICD-10-CM | POA: Diagnosis not present

## 2022-06-22 DIAGNOSIS — F1120 Opioid dependence, uncomplicated: Secondary | ICD-10-CM | POA: Diagnosis not present

## 2022-06-22 DIAGNOSIS — Z6839 Body mass index (BMI) 39.0-39.9, adult: Secondary | ICD-10-CM | POA: Diagnosis not present

## 2022-06-22 DIAGNOSIS — Z0130 Encounter for examination of blood pressure without abnormal findings: Secondary | ICD-10-CM | POA: Diagnosis not present

## 2022-06-23 DIAGNOSIS — E119 Type 2 diabetes mellitus without complications: Secondary | ICD-10-CM | POA: Diagnosis not present

## 2022-06-23 DIAGNOSIS — R1319 Other dysphagia: Secondary | ICD-10-CM | POA: Diagnosis not present

## 2022-06-23 DIAGNOSIS — Z1211 Encounter for screening for malignant neoplasm of colon: Secondary | ICD-10-CM | POA: Diagnosis not present

## 2022-06-28 DIAGNOSIS — K2270 Barrett's esophagus without dysplasia: Secondary | ICD-10-CM | POA: Diagnosis not present

## 2022-06-30 DIAGNOSIS — F4312 Post-traumatic stress disorder, chronic: Secondary | ICD-10-CM | POA: Diagnosis not present

## 2022-06-30 DIAGNOSIS — F331 Major depressive disorder, recurrent, moderate: Secondary | ICD-10-CM | POA: Diagnosis not present

## 2022-06-30 DIAGNOSIS — F411 Generalized anxiety disorder: Secondary | ICD-10-CM | POA: Diagnosis not present

## 2022-07-14 DIAGNOSIS — F4312 Post-traumatic stress disorder, chronic: Secondary | ICD-10-CM | POA: Diagnosis not present

## 2022-07-14 DIAGNOSIS — F331 Major depressive disorder, recurrent, moderate: Secondary | ICD-10-CM | POA: Diagnosis not present

## 2022-07-14 DIAGNOSIS — F411 Generalized anxiety disorder: Secondary | ICD-10-CM | POA: Diagnosis not present

## 2022-07-26 DIAGNOSIS — Z789 Other specified health status: Secondary | ICD-10-CM | POA: Diagnosis not present

## 2022-07-26 DIAGNOSIS — Z6839 Body mass index (BMI) 39.0-39.9, adult: Secondary | ICD-10-CM | POA: Diagnosis not present

## 2022-07-26 DIAGNOSIS — R7303 Prediabetes: Secondary | ICD-10-CM | POA: Diagnosis not present

## 2022-07-26 DIAGNOSIS — Z79899 Other long term (current) drug therapy: Secondary | ICD-10-CM | POA: Diagnosis not present

## 2022-07-26 DIAGNOSIS — Z114 Encounter for screening for human immunodeficiency virus [HIV]: Secondary | ICD-10-CM | POA: Diagnosis not present

## 2022-07-26 DIAGNOSIS — Z0130 Encounter for examination of blood pressure without abnormal findings: Secondary | ICD-10-CM | POA: Diagnosis not present

## 2022-07-26 DIAGNOSIS — M542 Cervicalgia: Secondary | ICD-10-CM | POA: Diagnosis not present

## 2022-07-26 DIAGNOSIS — F1120 Opioid dependence, uncomplicated: Secondary | ICD-10-CM | POA: Diagnosis not present

## 2022-08-02 DIAGNOSIS — F331 Major depressive disorder, recurrent, moderate: Secondary | ICD-10-CM | POA: Diagnosis not present

## 2022-08-02 DIAGNOSIS — F4312 Post-traumatic stress disorder, chronic: Secondary | ICD-10-CM | POA: Diagnosis not present

## 2022-08-02 DIAGNOSIS — F411 Generalized anxiety disorder: Secondary | ICD-10-CM | POA: Diagnosis not present

## 2022-08-08 ENCOUNTER — Other Ambulatory Visit: Payer: Self-pay

## 2022-08-08 ENCOUNTER — Emergency Department
Admission: EM | Admit: 2022-08-08 | Discharge: 2022-08-08 | Disposition: A | Discharge status: Home / Self Care | Payer: Medicare HMO | Attending: Emergency Medicine | Admitting: Emergency Medicine

## 2022-08-08 DIAGNOSIS — B9689 Other specified bacterial agents as the cause of diseases classified elsewhere: Secondary | ICD-10-CM | POA: Diagnosis not present

## 2022-08-08 DIAGNOSIS — R5383 Other fatigue: Secondary | ICD-10-CM | POA: Diagnosis not present

## 2022-08-08 DIAGNOSIS — H579 Unspecified disorder of eye and adnexa: Secondary | ICD-10-CM | POA: Diagnosis present

## 2022-08-08 DIAGNOSIS — H1031 Unspecified acute conjunctivitis, right eye: Secondary | ICD-10-CM | POA: Insufficient documentation

## 2022-08-08 DIAGNOSIS — H53149 Visual discomfort, unspecified: Secondary | ICD-10-CM | POA: Diagnosis not present

## 2022-08-08 DIAGNOSIS — G4710 Hypersomnia, unspecified: Secondary | ICD-10-CM | POA: Diagnosis not present

## 2022-08-08 DIAGNOSIS — R4182 Altered mental status, unspecified: Secondary | ICD-10-CM | POA: Diagnosis not present

## 2022-08-08 DIAGNOSIS — H539 Unspecified visual disturbance: Secondary | ICD-10-CM | POA: Diagnosis not present

## 2022-08-08 DIAGNOSIS — H5711 Ocular pain, right eye: Secondary | ICD-10-CM | POA: Diagnosis not present

## 2022-08-08 LAB — BASIC METABOLIC PANEL
Anion gap: 5 (ref 5–15)
BUN: 19 mg/dL (ref 6–20)
CO2: 30 mmol/L (ref 22–32)
Calcium: 8.6 mg/dL — ABNORMAL LOW (ref 8.9–10.3)
Chloride: 104 mmol/L (ref 98–111)
Creatinine, Ser: 0.76 mg/dL (ref 0.44–1.00)
GFR, Estimated: >60 mL/min (ref >60)
Glucose, Bld: 101 mg/dL — ABNORMAL HIGH (ref 70–99)
Potassium: 3.8 mmol/L (ref 3.5–5.1)
Sodium: 139 mmol/L (ref 135–145)

## 2022-08-08 LAB — CBC
HCT: 42.5 % (ref 36.0–46.0)
Hemoglobin: 13.1 g/dL (ref 12.0–15.0)
MCH: 29.1 pg (ref 26.0–34.0)
MCHC: 30.8 g/dL (ref 30.0–36.0)
MCV: 94.4 fL (ref 80.0–100.0)
Platelets: 258 10*3/uL (ref 150–400)
RBC: 4.5 MIL/uL (ref 3.87–5.11)
RDW: 13.3 % (ref 11.5–15.5)
WBC: 8.4 10*3/uL (ref 4.0–10.5)
nRBC: 0 % (ref 0.0–0.2)

## 2022-08-08 MED ORDER — POLYMYXIN B-TRIMETHOPRIM 10000-0.1 UNIT/ML-% OP SOLN: 2.0000 [drp] | Freq: Four times a day (QID) | OPHTHALMIC | 0 refills | Status: AC

## 2022-08-08 NOTE — ED Triage Notes (Addendum)
Pt to ED for irritation to right eye since Saturday. Pt reports feeling more tired than usual. Pt able to hold a conversation. Denies drug or ETOH use. Pt falling asleep in triage, states tired d/t not sleeping last night

## 2022-08-08 NOTE — ED Notes (Addendum)
See triage note. Pt came in due to R eye problem since Saturday night. Pt states she just woke up and it was painful and swollen and can now barely keep her eye open. Pt denies any new meds or foods. Pt is also very tired and fatigued stating minimal sleep. Pt currently A&Ox4.

## 2022-08-08 NOTE — ED Triage Notes (Signed)
Pt here from [facility] with right eye infx since Sat. Pt very lethargic while at [facility] today, pt continues to fall asleep and is altered.

## 2022-08-08 NOTE — ED Provider Notes (Signed)
[facility] Provider Note  Patient Contact: 3:48 PM (approximate)   History   Eye Problem and Fatigue   HPI  Diane Warner is a 57 y.o. female who presents the emergency department complaining of right eye possible infection as well fatigue.  Patient states that her eye is keeping her up at night and she is not sleeping.  She is drowsy but denies any other complaints to include headache, visual changes, chest pain, shortness of breath, URI symptoms.  Patient denies being around anybody with a similar eye complaint.  Does not wear glasses or contacts.     Physical Exam   Triage Vital Signs: ED Triage Vitals [08/08/22 1424]  Enc Vitals Group     BP 124/73     Pulse Rate 62     Resp 18     Temp 97.7 F (36.5 C)     Temp Source Oral     SpO2 95 %     Weight 232 lb (105.2 kg)     Height 5\' 4"  (1.626 m)     Head Circumference      Peak Flow      Pain Score 8     Pain Loc      Pain Edu?      Excl. in [place]?     Most recent vital signs: Vitals:   08/08/22 1424  BP: 124/73  Pulse: 62  Resp: 18  Temp: 97.7 F (36.5 C)  SpO2: 95%     General: Alert and in no acute distress. Eyes:  PERRL. EOMI. right conjunctival is grossly injected.  Significant amount of purulent drainage identified.  Eye is matted shut.  Funduscopic exam is unremarkable bilaterally.  Cardiovascular:  Good peripheral perfusion Respiratory: Normal respiratory effort without tachypnea or retractions. Lungs CTAB.  Musculoskeletal: Full range of motion to all extremities.  Neurologic:  No gross focal neurologic deficits are appreciated.  Skin:   No rash noted Other:   ED Results / Procedures / Treatments   Labs (all labs ordered are listed, but only abnormal results are displayed) Labs Reviewed  BASIC METABOLIC PANEL - Abnormal; Notable for the following components:      Result Value   Glucose, Bld 101 (*)    Calcium 8.6 (*)    All other components within normal  limits  CBC     EKG     RADIOLOGY    No results found.  PROCEDURES:  Critical Care performed: No  Procedures   MEDICATIONS ORDERED IN ED: Medications - No data to display   IMPRESSION / MDM / [place] / ED COURSE  I reviewed the triage vital signs and the nursing notes.                                 Differential diagnosis includes, but is not limited to, conjunctivitis, (bacterial, viral, chemical, allergic), preseptal cellulitis, orbital cellulitis  Patient's presentation is most consistent with acute presentation with potential threat to life or bodily function.   Patient's diagnosis is consistent with conjunctivitis.  Patient presents emergency department right eye irritation, drainage.  Patient has findings consistent with bacterial conjunctivitis.  She is also very fatigued, denies any other complaints.  Patient had labs for the eye infection from triage but she declines any further workup for her fatigue.  Patient is easily arousable, alert and oriented, able t answer questions appropriately at this time.  As such we will treat the patient's conjunctivitis with antibiotic eyedrops..  Follow-up primary care as needed.  Return precautions discussed with the patient.  Patient is given ED precautions to return to the ED for any worsening or new symptoms.     FINAL CLINICAL IMPRESSION(S) / ED DIAGNOSES   Final diagnoses:  Acute bacterial conjunctivitis of right eye     Rx / DC Orders   ED Discharge Orders          Ordered    trimethoprim-polymyxin b (POLYTRIM) ophthalmic solution  Every 6 hours        08/08/22 1601             Note:  This document was prepared using Dragon voice recognition software and may include unintentional dictation errors.   Brynda Peon 08/08/22 1601    Lavonia Drafts, MD 08/08/22 [phone]

## 2022-08-11 ENCOUNTER — Telehealth: Payer: Self-pay | Admitting: *Deleted

## 2022-08-11 NOTE — Telephone Encounter (Signed)
        Patient  visited [facility] on 08/08/2022  for treatment    Telephone encounter attempt :  1st  A HIPAA compliant voice message was left requesting a return call.  Instructed patient to call back at . [place] [phone] [address] , [place] [address] Email : [email or web address]

## 2022-08-11 NOTE — Telephone Encounter (Signed)
     Patient  visited [facility] on 08/08/2022  for treatment      Telephone encounter attempt :  2nd   A HIPAA compliant voice message was left requesting a return call.  Instructed patient to call back at .[id number] [place] [phone] [address] , [place] [address] Email : [email or web address]

## 2022-09-12 ENCOUNTER — Observation Stay (HOSPITAL_COMMUNITY)
Admission: EM | Admit: 2022-09-12 | Discharge: 2022-09-13 | Disposition: A | Discharge status: Home / Self Care | Payer: Medicare HMO | Attending: Family Medicine | Admitting: Family Medicine

## 2022-09-12 ENCOUNTER — Emergency Department (HOSPITAL_COMMUNITY): Payer: Medicare HMO

## 2022-09-12 ENCOUNTER — Encounter (HOSPITAL_COMMUNITY): Payer: Self-pay

## 2022-09-12 ENCOUNTER — Other Ambulatory Visit: Payer: Self-pay

## 2022-09-12 DIAGNOSIS — R0602 Shortness of breath: Secondary | ICD-10-CM | POA: Insufficient documentation

## 2022-09-12 DIAGNOSIS — E041 Nontoxic single thyroid nodule: Secondary | ICD-10-CM | POA: Diagnosis not present

## 2022-09-12 DIAGNOSIS — R059 Cough, unspecified: Secondary | ICD-10-CM | POA: Diagnosis present

## 2022-09-12 DIAGNOSIS — J189 Pneumonia, unspecified organism: Secondary | ICD-10-CM | POA: Diagnosis not present

## 2022-09-12 DIAGNOSIS — R519 Headache, unspecified: Secondary | ICD-10-CM | POA: Insufficient documentation

## 2022-09-12 DIAGNOSIS — G894 Chronic pain syndrome: Secondary | ICD-10-CM | POA: Diagnosis present

## 2022-09-12 DIAGNOSIS — Z1152 Encounter for screening for COVID-19: Secondary | ICD-10-CM | POA: Insufficient documentation

## 2022-09-12 DIAGNOSIS — J45909 Unspecified asthma, uncomplicated: Secondary | ICD-10-CM | POA: Insufficient documentation

## 2022-09-12 DIAGNOSIS — I10 Essential (primary) hypertension: Secondary | ICD-10-CM | POA: Diagnosis not present

## 2022-09-12 DIAGNOSIS — Z87891 Personal history of nicotine dependence: Secondary | ICD-10-CM | POA: Insufficient documentation

## 2022-09-12 DIAGNOSIS — R600 Localized edema: Secondary | ICD-10-CM | POA: Diagnosis present

## 2022-09-12 DIAGNOSIS — R609 Edema, unspecified: Secondary | ICD-10-CM | POA: Diagnosis not present

## 2022-09-12 DIAGNOSIS — Z79899 Other long term (current) drug therapy: Secondary | ICD-10-CM

## 2022-09-12 LAB — CBC
HCT: 39.1 % (ref 36.0–46.0)
Hemoglobin: 12.3 g/dL (ref 12.0–15.0)
MCH: 29.5 pg (ref 26.0–34.0)
MCHC: 31.5 g/dL (ref 30.0–36.0)
MCV: 93.8 fL (ref 80.0–100.0)
Platelets: 260 10*3/uL (ref 150–400)
RBC: 4.17 MIL/uL (ref 3.87–5.11)
RDW: 13.7 % (ref 11.5–15.5)
WBC: 12.8 10*3/uL — ABNORMAL HIGH (ref 4.0–10.5)
nRBC: 0 % (ref 0.0–0.2)

## 2022-09-12 LAB — BASIC METABOLIC PANEL
Anion gap: 7 (ref 5–15)
BUN: 11 mg/dL (ref 6–20)
CO2: 26 mmol/L (ref 22–32)
Calcium: 8.3 mg/dL — ABNORMAL LOW (ref 8.9–10.3)
Chloride: 106 mmol/L (ref 98–111)
Creatinine, Ser: 0.73 mg/dL (ref 0.44–1.00)
GFR, Estimated: >60 mL/min (ref >60)
Glucose, Bld: 102 mg/dL — ABNORMAL HIGH (ref 70–99)
Potassium: 3.1 mmol/L — ABNORMAL LOW (ref 3.5–5.1)
Sodium: 139 mmol/L (ref 135–145)

## 2022-09-12 LAB — TROPONIN I (HIGH SENSITIVITY): Troponin I (High Sensitivity): 3 ng/L (ref <18)

## 2022-09-12 LAB — RESP PANEL BY RT-PCR (RSV, FLU A&B, COVID)  RVPGX2
Influenza A by PCR: NEGATIVE
Influenza B by PCR: NEGATIVE
Resp Syncytial Virus by PCR: NEGATIVE
SARS Coronavirus 2 by RT PCR: NEGATIVE

## 2022-09-12 LAB — BRAIN NATRIURETIC PEPTIDE: B Natriuretic Peptide: 80.8 pg/mL (ref 0.0–100.0)

## 2022-09-12 MED ORDER — IOHEXOL 350 MG/ML SOLN
75.0000 mL | Freq: Once | INTRAVENOUS | Status: AC | PRN
  Administered 2022-09-12: 80 mL via INTRAVENOUS

## 2022-09-12 MED ORDER — SODIUM CHLORIDE 0.9 % IV SOLN
12.5000 mg | Freq: Four times a day (QID) | INTRAVENOUS | Status: DC | PRN
  Administered 2022-09-12: 12.5 mg via INTRAVENOUS
  Filled 2022-09-12: qty 12.5

## 2022-09-12 MED ORDER — MORPHINE SULFATE (PF) 4 MG/ML IV SOLN
4.0000 mg | Freq: Once | INTRAVENOUS | Status: AC
  Administered 2022-09-12: 4 mg via INTRAVENOUS
  Filled 2022-09-12: qty 1

## 2022-09-12 MED ORDER — DIPHENHYDRAMINE HCL 50 MG/ML IJ SOLN
12.5000 mg | Freq: Once | INTRAMUSCULAR | Status: AC
  Administered 2022-09-12: 12.5 mg via INTRAVENOUS
  Filled 2022-09-12: qty 1

## 2022-09-12 NOTE — ED Provider Notes (Incomplete)
[place] EMERGENCY DEPARTMENT AT [facility] Provider Note   CSN: [id number] Arrival date & time: 09/12/22  2146     History {Add pertinent medical, surgical, social history, OB history to HPI:1} Chief Complaint  Patient presents with  . Cough    Diane Warner is a 57 y.o. female with past medical history significant for asthma, anxiety, acid reflux, hypertension, chronic pain syndrome who presents with concern for multiple complaints today.  Patient reports her primary concern is that she feels weak, endorses cough, chills, shortness of breath.  Patient reports she took her inhaler without significant relief.  She endorses that she has had some edema to the left foot for several days, reports that she had been scheduled for an outpatient ultrasound scheduled for the eighth of next month secondary to concern for possible blood clot.  Patient reports remote history of blood clots, she is not currently taking any blood thinners.  She reports some sharp, midsternal chest pain associated with respiration.  Additionally patient reports some vague tingling/numbness of right-sided fingers, lips, tongue that has been on 2 to 3 days.  Patient denies any previous history of stroke.  She denies any recent change in the symptoms, she denies any balance issues, changes, double vision.  She reports some mild left-sided abdominal pain.   Cough      Home Medications Prior to Admission medications   Medication Sig Start Date End Date Taking? Authorizing Provider  albuterol (PROVENTIL HFA;VENTOLIN HFA) 108 (90 Base) MCG/ACT inhaler Inhale 2 puffs into the lungs every 6 (six) hours as needed for wheezing or shortness of breath. 06/08/18   Phineas Semen, MD  amLODipine (NORVASC) 5 MG tablet Take 1 tablet (5 mg total) by mouth daily. 08/01/18 08/01/19  Cuthriell, Delorise Royals, PA-C  dicyclomine (BENTYL) 10 MG capsule Take 1 capsule (10 mg total) by mouth 4 (four) times daily for 14 days.  04/20/20 05/04/20  Fisher, Roselyn Bering, PA-C  furosemide (LASIX) 20 MG tablet Take 1 tablet (20 mg total) by mouth daily. 08/01/18 08/01/19  Cuthriell, Delorise Royals, PA-C  losartan (COZAAR) 25 MG tablet Take 1 tablet (25 mg total) by mouth daily. 08/01/18 08/01/19  Cuthriell, Delorise Royals, PA-C  promethazine (PHENERGAN) 25 MG tablet Take 1 tablet (25 mg total) by mouth every 6 (six) hours as needed for nausea or vomiting. 04/20/20   Fisher, Roselyn Bering, PA-C  trimethoprim-polymyxin b (POLYTRIM) ophthalmic solution Place 2 drops into the right eye every 6 (six) hours. 08/08/22   Cuthriell, Delorise Royals, PA-C      Allergies    Zofran Frazier Richards hcl], Amoxicillin, Aspirin, Compazine, Dilaudid [hydromorphone hcl], Penicillins, and Proton pump inhibitors    Review of Systems   Review of Systems  Respiratory:  Positive for cough.   All other systems reviewed and are negative.   Physical Exam Updated Vital Signs BP 132/63 (BP Location: Right Arm)   Pulse 90   Temp 99.5 F (37.5 C) (Oral)   Resp (!) 21   Ht 5\' 4"  (1.626 m)   Wt 110.7 kg   LMP  (LMP Unknown)   SpO2 95%   BMI 41.88 kg/m  Physical Exam Vitals and nursing note reviewed.  Constitutional:      General: She is not in acute distress.    Appearance: Normal appearance.  HENT:     Head: Normocephalic and atraumatic.  Eyes:     General:        Right eye: No discharge.  Left eye: No discharge.  Cardiovascular:     Rate and Rhythm: Normal rate and regular rhythm.     Pulses: Normal pulses.     Heart sounds: No murmur heard.    No friction rub. No gallop.     Comments: DP, PT pulses intact bilaterally Pulmonary:     Effort: Pulmonary effort is normal.     Breath sounds: Normal breath sounds.     Comments: No wheezing, rhonchi, stridor, rales Abdominal:     General: Bowel sounds are normal.     Palpations: Abdomen is soft.  Musculoskeletal:     Comments: Left leg is with 1-2+ edema compared to right, right with 0-1+ edema.  No focal  tenderness, redness.  Negative Thompson sign bilaterally.  Edema is nonpitting.  Skin:    General: Skin is warm and dry.     Capillary Refill: Capillary refill takes less than 2 seconds.  Neurological:     Mental Status: She is alert and oriented to person, place, and time.  Psychiatric:        Mood and Affect: Mood normal.        Behavior: Behavior normal.     ED Results / Procedures / Treatments   Labs (all labs ordered are listed, but only abnormal results are displayed) Labs Reviewed  RESP PANEL BY RT-PCR (RSV, FLU A&B, COVID)  [id number]  BASIC METABOLIC PANEL  CBC  BRAIN NATRIURETIC PEPTIDE  TROPONIN I (HIGH SENSITIVITY)    EKG None  Radiology No results found.  Procedures Procedures  {Document cardiac monitor, telemetry assessment procedure when appropriate:1}  Medications Ordered in ED Medications  morphine (PF) 4 MG/ML injection 4 mg (has no administration in time range)    ED Course/ Medical Decision Making/ A&P   {   Click here for ABCD2, HEART and other calculatorsREFRESH Note before signing :1}                          Medical Decision Making Amount and/or Complexity of Data Reviewed Labs: ordered. Radiology: ordered.  Risk Prescription drug management.   This patient is a 57 y.o. female  who presents to the ED for concern of chest pain, shob.   Differential diagnoses prior to evaluation: The emergent differential diagnosis includes, but is not limited to,  *** . This is not an exhaustive differential.   Past Medical History / Co-morbidities: ***  Additional history: Chart reviewed. Pertinent results include: ***  Physical Exam: Physical exam performed. The pertinent findings include: ***  Lab Tests/Imaging studies: I personally interpreted labs/imaging and the pertinent results include:  ***. ***I agree with the radiologist interpretation.  Cardiac monitoring: EKG obtained and interpreted by my attending physician which shows: ***    Medications: I ordered medication including ***.  I have reviewed the patients home medicines and have made adjustments as needed.   Disposition: After consideration of the diagnostic results and the patients response to treatment, I feel that *** .   ***emergency department workup does not suggest an emergent condition requiring admission or immediate intervention beyond what has been performed at this time. The plan is: ***. The patient is safe for discharge and has been instructed to return immediately for worsening symptoms, change in symptoms or any other concerns.  Final Clinical Impression(s) / ED Diagnoses Final diagnoses:  None    Rx / DC Orders ED Discharge Orders     None

## 2022-09-12 NOTE — ED Provider Notes (Signed)
[place] EMERGENCY DEPARTMENT AT [facility] Provider Note   CSN: [id number] Arrival date & time: 09/12/22  2146     History  Chief Complaint  Patient presents with   Cough    Diane Warner is a 57 y.o. female with past medical history significant for asthma, anxiety, acid reflux, hypertension, chronic pain syndrome who presents with concern for multiple complaints today.  Patient reports her primary concern is that she feels weak, endorses cough, chills, shortness of breath.  Patient reports she took her inhaler without significant relief.  She endorses that she has had some edema to the left foot for several days, reports that she had been scheduled for an outpatient ultrasound scheduled for the eighth of next month secondary to concern for possible blood clot.  Patient reports remote history of blood clots, she is not currently taking any blood thinners.  She reports some sharp, midsternal chest pain associated with respiration.  Additionally patient reports some vague tingling/numbness of right-sided fingers, lips, tongue that has been on 2 to 3 days.  Patient denies any previous history of stroke.  She denies any recent change in the symptoms, she denies any balance issues, changes, double vision.  She reports some mild left-sided abdominal pain.   Cough      Home Medications Prior to Admission medications   Medication Sig Start Date End Date Taking? Authorizing Provider  albuterol (PROVENTIL HFA;VENTOLIN HFA) 108 (90 Base) MCG/ACT inhaler Inhale 2 puffs into the lungs every 6 (six) hours as needed for wheezing or shortness of breath. 06/08/18   Phineas Semen, MD  amLODipine (NORVASC) 5 MG tablet Take 1 tablet (5 mg total) by mouth daily. 08/01/18 09/12/22  Cuthriell, Delorise Royals, PA-C  dicyclomine (BENTYL) 10 MG capsule Take 1 capsule (10 mg total) by mouth 4 (four) times daily for 14 days. 04/20/20 09/12/22  Fisher, Roselyn Bering, PA-C  furosemide (LASIX) 20 MG tablet  Take 1 tablet (20 mg total) by mouth daily. 08/01/18 09/12/22  Cuthriell, Delorise Royals, PA-C  losartan (COZAAR) 25 MG tablet Take 1 tablet (25 mg total) by mouth daily. 08/01/18 09/12/22  Cuthriell, Delorise Royals, PA-C  promethazine (PHENERGAN) 25 MG tablet Take 1 tablet (25 mg total) by mouth every 6 (six) hours as needed for nausea or vomiting. 04/20/20   Fisher, Roselyn Bering, PA-C  trimethoprim-polymyxin b (POLYTRIM) ophthalmic solution Place 2 drops into the right eye every 6 (six) hours. 08/08/22   Cuthriell, Delorise Royals, PA-C      Allergies    Zofran Frazier Richards hcl], Amoxicillin, Aspirin, Compazine, Dilaudid [hydromorphone hcl], Penicillins, and Proton pump inhibitors    Review of Systems   Review of Systems  Respiratory:  Positive for cough.   All other systems reviewed and are negative.   Physical Exam Updated Vital Signs BP (!) 119/57 (BP Location: Right Arm)   Pulse 87   Temp 99.4 F (37.4 C) (Oral)   Resp 18   Ht 5\' 4"  (1.626 m)   Wt 110.7 kg   LMP  (LMP Unknown)   SpO2 95%   BMI 41.88 kg/m  Physical Exam Vitals and nursing note reviewed.  Constitutional:      General: She is not in acute distress.    Appearance: Normal appearance.  HENT:     Head: Normocephalic and atraumatic.  Eyes:     General:        Right eye: No discharge.        Left eye: No discharge.  Cardiovascular:  Rate and Rhythm: Normal rate and regular rhythm.     Pulses: Normal pulses.     Heart sounds: No murmur heard.    No friction rub. No gallop.     Comments: DP, PT pulses intact bilaterally Pulmonary:     Effort: Pulmonary effort is normal.     Breath sounds: Normal breath sounds.     Comments: No wheezing, rhonchi, stridor, rales Abdominal:     General: Bowel sounds are normal.     Palpations: Abdomen is soft.  Musculoskeletal:     Comments: Left leg is with 1-2+ edema compared to right, right with 0-1+ edema.  No focal tenderness, redness.  Negative Thompson sign bilaterally.  Edema is  nonpitting.  Skin:    General: Skin is warm and dry.     Capillary Refill: Capillary refill takes less than 2 seconds.  Neurological:     Mental Status: She is alert and oriented to person, place, and time.  Psychiatric:        Mood and Affect: Mood normal.        Behavior: Behavior normal.     ED Results / Procedures / Treatments   Labs (all labs ordered are listed, but only abnormal results are displayed) Labs Reviewed  BASIC METABOLIC PANEL - Abnormal; Notable for the following components:      Result Value   Potassium 3.1 (*)    Glucose, Bld 102 (*)    Calcium 8.3 (*)    All other components within normal limits  CBC - Abnormal; Notable for the following components:   WBC 12.8 (*)    All other components within normal limits  RESP PANEL BY RT-PCR (RSV, FLU A&B, COVID)  RVPGX2  BRAIN NATRIURETIC PEPTIDE  TROPONIN I (HIGH SENSITIVITY)    EKG None  Radiology CT Angio Chest PE W and/or Wo Contrast  Result Date: 09/12/2022 CLINICAL DATA:  Pulmonary embolism (PE) suspected, high prob EXAM: CT ANGIOGRAPHY CHEST WITH CONTRAST TECHNIQUE: Multidetector CT imaging of the chest was performed using the standard protocol during bolus administration of intravenous contrast. Multiplanar CT image reconstructions and MIPs were obtained to evaluate the vascular anatomy. RADIATION DOSE REDUCTION: This exam was performed according to the departmental dose-optimization program which includes automated exposure control, adjustment of the mA and/or kV according to patient size and/or use of iterative reconstruction technique. CONTRAST:  80mL OMNIPAQUE IOHEXOL 350 MG/ML SOLN COMPARISON:  Chest x-ray 09/12/2022, CT chest 06/04/2020 FINDINGS: Cardiovascular: Satisfactory opacification of the pulmonary arteries to the segmental level. No evidence of pulmonary embolism. Limited evaluation of the subsegmental level due to motion artifact and lung findings. The main pulmonary artery is normal in caliber.  Normal heart size. No significant pericardial effusion. The thoracic aorta is normal in caliber. No atherosclerotic plaque of the thoracic aorta. No coronary artery calcifications. Mediastinum/Nodes: Prominent mediastinal and hilar lymph nodes. Enlarged superior anterior 1.8 cm lymph node versus left thyroid gland nodule. No enlarged mediastinal, hilar, or axillary lymph nodes. Thyroid gland, trachea, and esophagus demonstrate no significant findings. Tiny hiatal hernia. Lungs/Pleura: Diffuse right lung and to lesser extent left lung peribronchovascular consolidative patchy airspace opacities. No pulmonary mass. Trace bilateral pleural effusions, right greater than left. No pneumothorax. Upper Abdomen: Status post cholecystectomy.  No acute abnormality. Musculoskeletal: No chest wall abnormality. No suspicious lytic or blastic osseous lesions. No acute displaced fracture. Review of the MIP images confirms the above findings. IMPRESSION: 1. No pulmonary embolus. 2. Diffuse right lung and to lesser extent left lung  peribronchovascular consolidative patchy airspace opacities. Findings suggestive of edema or alveolar hemorrhage. Superimposed infection/inflammation not excluded. Recommend follow-up CT chest in 3 months to evaluate for complete resolution. 3. Trace bilateral pleural effusions, right greater than left. 4. Prominent mediastinal lymph nodes and hilar lymph nodes-recommend attention on follow-up. Likely reactive. 5. Enlarged superior anterior 1.8 cm lymph node versus left thyroid gland nodule. Recommend thyroid US (ref: J Am Coll Radiol. 2015 Feb;12(2): 143-50). Electronically Signed   By: Tish Frederickson M.D.   On: 09/12/2022 23:51   CT Head Wo Contrast  Result Date: 09/12/2022 CLINICAL DATA:  Headache, neuro deficit EXAM: CT HEAD WITHOUT CONTRAST TECHNIQUE: Contiguous axial images were obtained from the base of the skull through the vertex without intravenous contrast. RADIATION DOSE REDUCTION: This  exam was performed according to the departmental dose-optimization program which includes automated exposure control, adjustment of the mA and/or kV according to patient size and/or use of iterative reconstruction technique. COMPARISON:  CT head 06/04/2020 FINDINGS: Brain: No evidence of large-territorial acute infarction. No parenchymal hemorrhage. No mass lesion. No extra-axial collection. No mass effect or midline shift. No hydrocephalus. Basilar cisterns are patent. Vascular: No hyperdense vessel. Skull: No acute fracture or focal lesion. Sinuses/Orbits: Paranasal sinuses and mastoid air cells are clear. The orbits are unremarkable. Other: None. IMPRESSION: No acute intracranial abnormality. Electronically Signed   By: Tish Frederickson M.D.   On: 09/12/2022 23:44   DG Chest Portable 1 View  Result Date: 09/12/2022 CLINICAL DATA:  Chest pain with shortness of breath EXAM: PORTABLE CHEST 1 VIEW COMPARISON:  Chest x-ray 11/29/2019 FINDINGS: There is multifocal airspace disease in the right mid and upper lung worrisome for pneumonia. There is no pleural effusion or pneumothorax. There is prominence of the right hilum, new from the prior examination. Heart size is within normal limits. No acute fractures are seen. IMPRESSION: 1. Multifocal airspace disease in the right mid and upper lung worrisome for pneumonia. Followup PA and lateral chest X-ray is recommended in 3-4 weeks following trial of antibiotic therapy to ensure resolution and exclude underlying malignancy. 2. New prominence of the right hilum may be related to reactive lymphadenopathy. Attention on follow-up x-ray recommended. Electronically Signed   By: Darliss Cheney M.D.   On: 09/12/2022 22:22    Procedures Procedures    Medications Ordered in ED Medications  promethazine (PHENERGAN) 12.5 mg in sodium chloride 0.9 % 50 mL IVPB (0 mg Intravenous Stopped 09/13/22 0043)  potassium chloride SA (KLOR-CON M) CR tablet 40 mEq (40 mEq Oral Patient  Refused/Not Given 09/13/22 0043)  cefTRIAXone (ROCEPHIN) 1 g in sodium chloride 0.9 % 100 mL IVPB (1 g Intravenous New Bag/Given 09/13/22 0113)  ketorolac (TORADOL) 30 MG/ML injection 30 mg (30 mg Intravenous Given 09/13/22 0052)  morphine (PF) 4 MG/ML injection 4 mg (4 mg Intravenous Given 09/12/22 2229)  diphenhydrAMINE (BENADRYL) injection 12.5 mg (12.5 mg Intravenous Given 09/12/22 2306)  iohexol (OMNIPAQUE) 350 MG/ML injection 75 mL (80 mLs Intravenous Contrast Given 09/12/22 2338)    ED Course/ Medical Decision Making/ A&P                             Medical Decision Making Amount and/or Complexity of Data Reviewed Labs: ordered. Radiology: ordered.  Risk Prescription drug management.   This patient is a 57 y.o. female  who presents to the ED for concern of chest pain, shob.   Differential diagnoses prior to evaluation: The emergent differential  diagnosis includes, but is not limited to,  ACS, AAS, PE, Mallory-Weiss, Boerhaave's, Pneumonia, acute bronchitis, asthma or COPD exacerbation, anxiety, MSK pain or traumatic injury to the chest, acid reflux versus other. This is not an exhaustive differential.   Past Medical History / Co-morbidities: Anxiety, arthritis, asthma, GERD, HTN, chronic pain  Additional history: Chart reviewed. Pertinent results include: reviewed labwork, imaging from previous ED visits  Physical Exam: Physical exam performed. The pertinent findings include: mild tachypnea, low oxygen saturation of 91%, placed on nasal cannula. Mildly elevated temperature of 99.5 without acute fever.   Lab Tests/Imaging studies: I personally interpreted labs/imaging and the pertinent results include: Moderate leukocytosis, white blood cells 12.8, RVP negative for COVID, flu, RSV, troponin is negative x 1 in context of no active chest pain.  Her BNP is normal at 80.8.  Her BMP shows mild hypokalemia, potassium 3.1.  We will orally replete.  Independently interpreted plain film  chest x-ray, CT head without contrast, CT angio PE study which shows no evidence of pulmonary embolism, she has diffuse interstitial infiltrate on the right suspicious for developing pneumonia, with reactive lymphadenopathy noted. I agree with the radiologist interpretation.  Cardiac monitoring: EKG obtained and interpreted by my attending physician which shows: Normal sinus rhythm   Medications: I ordered medication including Phenergan, Benadryl for nausea, morphine, Toradol for pain, Rocephin for pneumonia.  I have reviewed the patients home medicines and have made adjustments as needed.   Disposition: After consideration of the diagnostic results and the patients response to treatment, I feel that the patient may be a candidate for outpatient evaluation of her pneumonia, she reports homelessness, and for follow-up, overall think that given that she is mildly tachypneic, mildly with a low oxygen saturation she would benefit from hospital admission for IV antibiotics and management of her symptoms.   emergency department workup does not suggest an emergent condition requiring admission or immediate intervention beyond what has been performed at this time. The plan is: as above. The patient is safe for discharge and has been instructed to return immediately for worsening symptoms, change in symptoms or any other concerns.  Final Clinical Impression(s) / ED Diagnoses Final diagnoses:  Community acquired pneumonia of right lung, unspecified part of lung    Rx / DC Orders ED Discharge Orders     None         Olene Floss, PA-C 09/13/22 0134    Virgina Norfolk, DO 09/14/22 [phone]

## 2022-09-12 NOTE — ED Triage Notes (Addendum)
BIBA from home for cough and SHOB, took inhaler with no relief, has hx asthma , vss, CBG 133, edema to Lt foot, and sharp midsternal CP

## 2022-09-13 ENCOUNTER — Observation Stay (HOSPITAL_COMMUNITY): Payer: Medicare HMO

## 2022-09-13 ENCOUNTER — Encounter (HOSPITAL_COMMUNITY): Payer: Self-pay | Admitting: Internal Medicine

## 2022-09-13 ENCOUNTER — Observation Stay (HOSPITAL_BASED_OUTPATIENT_CLINIC_OR_DEPARTMENT_OTHER): Payer: Medicare HMO

## 2022-09-13 DIAGNOSIS — M7989 Other specified soft tissue disorders: Secondary | ICD-10-CM

## 2022-09-13 DIAGNOSIS — J189 Pneumonia, unspecified organism: Principal | ICD-10-CM | POA: Diagnosis present

## 2022-09-13 DIAGNOSIS — E041 Nontoxic single thyroid nodule: Secondary | ICD-10-CM | POA: Diagnosis not present

## 2022-09-13 DIAGNOSIS — R600 Localized edema: Secondary | ICD-10-CM | POA: Diagnosis not present

## 2022-09-13 DIAGNOSIS — G894 Chronic pain syndrome: Secondary | ICD-10-CM

## 2022-09-13 DIAGNOSIS — I10 Essential (primary) hypertension: Secondary | ICD-10-CM

## 2022-09-13 DIAGNOSIS — Z79899 Other long term (current) drug therapy: Secondary | ICD-10-CM

## 2022-09-13 LAB — HIV ANTIBODY (ROUTINE TESTING W REFLEX): HIV Screen 4th Generation wRfx: NONREACTIVE

## 2022-09-13 MED ORDER — ACETAMINOPHEN 325 MG PO TABS: 650.0000 mg | ORAL_TABLET | Freq: Four times a day (QID) | ORAL | Status: DC | PRN

## 2022-09-13 MED ORDER — ENOXAPARIN SODIUM 40 MG/0.4ML IJ SOSY
40.0000 mg | PREFILLED_SYRINGE | INTRAMUSCULAR | Status: DC
  Filled 2022-09-13: qty 0.4

## 2022-09-13 MED ORDER — ORAL CARE MOUTH RINSE: 15.0000 mL | OROMUCOSAL | Status: DC | PRN

## 2022-09-13 MED ORDER — MORPHINE SULFATE (PF) 4 MG/ML IV SOLN: 4.0000 mg | Freq: Once | INTRAVENOUS | Status: DC

## 2022-09-13 MED ORDER — SODIUM CHLORIDE 0.9 % IV SOLN
1.0000 g | Freq: Once | INTRAVENOUS | Status: AC
  Administered 2022-09-13: 1 g via INTRAVENOUS
  Filled 2022-09-13: qty 10

## 2022-09-13 MED ORDER — HYDROCODONE-ACETAMINOPHEN 10-325 MG PO TABS
1.0000 | ORAL_TABLET | Freq: Two times a day (BID) | ORAL | Status: DC | PRN
  Administered 2022-09-13 (×2): 1 via ORAL
  Filled 2022-09-13 (×3): qty 1

## 2022-09-13 MED ORDER — KETOROLAC TROMETHAMINE 30 MG/ML IJ SOLN
30.0000 mg | Freq: Four times a day (QID) | INTRAMUSCULAR | Status: DC | PRN
  Administered 2022-09-13 (×2): 30 mg via INTRAVENOUS
  Filled 2022-09-13 (×2): qty 1

## 2022-09-13 MED ORDER — CEFDINIR 300 MG PO CAPS: 300.0000 mg | ORAL_CAPSULE | Freq: Two times a day (BID) | ORAL | 0 refills | Status: AC

## 2022-09-13 MED ORDER — BENZONATATE 100 MG PO CAPS: 200.0000 mg | ORAL_CAPSULE | Freq: Three times a day (TID) | ORAL | Status: DC | PRN

## 2022-09-13 MED ORDER — SODIUM CHLORIDE 0.9 % IV SOLN: 2.0000 g | INTRAVENOUS | Status: DC

## 2022-09-13 MED ORDER — POTASSIUM CHLORIDE CRYS ER 20 MEQ PO TBCR
40.0000 meq | EXTENDED_RELEASE_TABLET | Freq: Once | ORAL | Status: DC
  Filled 2022-09-13: qty 2

## 2022-09-13 MED ORDER — AZITHROMYCIN 250 MG PO TABS
500.0000 mg | ORAL_TABLET | Freq: Every day | ORAL | Status: DC
  Administered 2022-09-13: 500 mg via ORAL
  Filled 2022-09-13: qty 2

## 2022-09-13 NOTE — Assessment & Plan Note (Signed)
Ordering the recommended thyroid US.

## 2022-09-13 NOTE — Assessment & Plan Note (Addendum)
Mild PNA (PORT score 45) but EDP concerned that pt wouldn't fill ABx scripts and so requested hospitalist admit. DDx includes CHF, though this seems less likely with no history of such and nl BNP. PNA pathway Rocephin / azithro COVID, flu, RSV neg Tessalon PRN cough

## 2022-09-13 NOTE — Discharge Summary (Signed)
Physician Discharge Summary   Patient: Diane Warner MRN: [id number] DOB: August 05, 1965  Admit date:     09/12/2022  Discharge date: 09/13/22  Discharge Physician: Tyrone Nine   PCP: [facility]   Recommendations at discharge:  Continue work up of chronic medical conditions as already scheduled through PCP Complete treatment for pneumonia as outpatient. Recommend follow up CT chest in 3 months to evaluate for complete resolution of peribronchovascular patchy opacities on CT.  Repeat thyroid U/S suggested in 1 year for surveillance of incidental nodule (see radiographic data below).   Discharge Diagnoses: Principal Problem:   CAP (community acquired pneumonia) Active Problems:   Thyroid nodule   Lower extremity edema   Hypertension   Chronic pain syndrome   Long term prescription benzodiazepine use  Hospital Course: Per H&P this AM by Dr. Julian Reil: "Diane Warner is a 57 y.o. female with medical history significant of HTN, obesity, homelessness, chronic pain.   Pt in to ED with c/o cough, chills, SOB, generalized weakness.  Inhaler not providing relief.   Pt also has LE swelling of L foot for several days.  Some CP with respiration, sharp, mid sternal."  Work up in the ED revealed normal BNP and troponin, negative viral panel, no PE on CTA chest, though R > L airspace opacities were noted and antibiotics were started for pneumonia. She has normal work of breathing and no hypoxia, taking po, ambulating independently, and has maximized benefit of inpatient management.   Assessment and Plan: * CAP (community acquired pneumonia) Mild PNA (PORT score 45) but EDP concerned that pt wouldn't fill ABx scripts and so requested hospitalist admit. DDx includes CHF, though this seems less likely with no history of such and nl BNP.  - Afebrile, no hypoxia even on exertion on day of discharge. Will complete a longer treatment course with cefdinir and suggest recheck CT chest  in 3 months to monitor for resolution of opacities.   Lower extremity edema: This has significantly improved without directed intervention. Only change may have been elevation in bed.  - Continue elevation.  - Venous LE U/S performed, informally shows no DVT. Patient declined VTE ppx lovenox.    Thyroid nodule: Incidentally noted on CTA chest.  - Thyroid U/S showed a 2.3 cm left inferior thyroid nodule (nodule 2) meets criteria for 1 year follow-up ultrasound. An additional 0.8 cm right inferior thyroid nodule does not meet criteria for biopsy or follow-up.  Chronic pain syndrome According to PMPaware she is prescribed Norco 10, 1 tab 2x a day.  Last filled earlier this month. Continue follow up with PCP/pain medicine at [facility].  Hypertension: No changes.   Sheltered homelessness: CSW consulted  Consultants: None Procedures performed: None  Disposition: Home Diet recommendation: Regular DISCHARGE MEDICATION: Allergies as of 09/13/2022       Reactions   Zofran [ondansetron Hcl] Hives, Diarrhea   Amoxicillin Nausea Only   Aspirin Other (See Comments)   Gi upset   Compazine Hives   Dilaudid [hydromorphone Hcl] Itching   Penicillins Nausea Only   Proton Pump Inhibitors Hives        Medication List     TAKE these medications    albuterol 108 (90 Base) MCG/ACT inhaler Commonly known as: VENTOLIN HFA Inhale 2 puffs into the lungs every 6 (six) hours as needed for wheezing or shortness of breath.   amLODipine 5 MG tablet Commonly known as: NORVASC Take 1 tablet (5 mg total) by mouth daily.  buPROPion 150 MG 24 hr tablet Commonly known as: WELLBUTRIN XL Take 150 mg by mouth every morning.   cefdinir 300 MG capsule Commonly known as: OMNICEF Take 1 capsule (300 mg total) by mouth 2 (two) times daily.   citalopram 40 MG tablet Commonly known as: CELEXA Take 40 mg by mouth daily.   dicyclomine 10 MG capsule Commonly known as: Bentyl Take 1 capsule (10 mg  total) by mouth 4 (four) times daily for 14 days.   famotidine 40 MG tablet Commonly known as: PEPCID Take 40 mg by mouth daily.   furosemide 20 MG tablet Commonly known as: Lasix Take 1 tablet (20 mg total) by mouth daily.   gabapentin 300 MG capsule Commonly known as: NEURONTIN Take by mouth.   HYDROcodone-acetaminophen 10-325 MG tablet Commonly known as: NORCO Take 1 tablet by mouth 2 (two) times daily as needed.   hydrOXYzine 25 MG tablet Commonly known as: ATARAX Take 25 mg by mouth 3 (three) times daily.   hydrOXYzine 50 MG capsule Commonly known as: VISTARIL Take 50 mg by mouth 2 (two) times daily as needed.   levothyroxine 25 MCG tablet Commonly known as: SYNTHROID Take 25 mcg by mouth daily.   losartan 25 MG tablet Commonly known as: Cozaar Take 1 tablet (25 mg total) by mouth daily.   methocarbamol 500 MG tablet Commonly known as: ROBAXIN Take 500 mg by mouth 2 (two) times daily.   omeprazole 40 MG capsule Commonly known as: PRILOSEC Take 40 mg by mouth daily.   potassium chloride 10 MEQ CR capsule Commonly known as: MICRO-K Take 10 mEq by mouth daily.   prazosin 1 MG capsule Commonly known as: MINIPRESS Take 1 mg by mouth at bedtime.   promethazine 25 MG tablet Commonly known as: PHENERGAN Take 1 tablet (25 mg total) by mouth every 6 (six) hours as needed for nausea or vomiting.   Rexulti 0.5 MG Tabs Generic drug: Brexpiprazole Take 1 tablet by mouth daily.   rizatriptan 10 MG tablet Commonly known as: MAXALT Take 10 mg by mouth 2 (two) times daily as needed.   Rybelsus 7 MG Tabs Generic drug: Semaglutide Take 1 tablet by mouth daily.   sucralfate 1 GM/10ML suspension Commonly known as: CARAFATE SMARTSIG:Milliliter(s) By Mouth   trimethoprim-polymyxin b ophthalmic solution Commonly known as: POLYTRIM Place 2 drops into the right eye every 6 (six) hours.        Follow-up Information     [facility] Follow up.    Contact information: [address] [place] [address] [phone]                Discharge Exam: Ceasar Mons Weights   09/12/22 2153  Weight: 110.7 kg  BP 139/73 (BP Location: Right Arm)   Pulse 89   Temp 98 F (36.7 C) (Oral)   Resp 17   Ht 5\' 4"  (1.626 m)   Wt 110.7 kg   LMP  (LMP Unknown)   SpO2 94%   BMI 41.88 kg/m   56yo female in no distress Clear, nonlabored. No wheezes or crackles.  RRR, no MRG. Edema in feet is trace at most, L > R.  Alert, oriented.   Condition at discharge: stable  The results of significant diagnostics from this hospitalization (including imaging, microbiology, ancillary and laboratory) are listed below for reference.   Imaging Studies: VAS [place] LOWER EXTREMITY VENOUS (DVT)  Result Date: 09/13/2022  Lower Venous DVT Study Patient Name:  Diane Warner  Date  of Exam:   09/13/2022 Medical Rec #: [id number]            Accession #:    [id number] Date of Birth: 1965/12/22            Patient Gender: F Patient Age:   57 years Exam Location:  [facility] Procedure:      VAS [place] LOWER EXTREMITY VENOUS (DVT) Referring Phys: Jilda Panda GARDNER --------------------------------------------------------------------------------  Indications: LLE foot swelling.  Limitations: Poor ultrasound/tissue interface. Comparison Study: Previous exams on 05/06/2021 (RLEV) and 03/23/2015 (LLEV) were                   negative for DVT. Performing Technologist: Ernestene Mention RVT, RDMS  Examination Guidelines: A complete evaluation includes B-mode imaging, spectral Doppler, color Doppler, and power Doppler as needed of all accessible portions of each vessel. Bilateral testing is considered an integral part of a complete examination. Limited examinations for reoccurring indications may be performed as noted. The reflux portion of the exam is performed with the patient in reverse Trendelenburg.  +---------+---------------+---------+-----------+----------+--------------+ RIGHT     CompressibilityPhasicitySpontaneityPropertiesThrombus Aging +---------+---------------+---------+-----------+----------+--------------+ CFV      Full           Yes      Yes                                 +---------+---------------+---------+-----------+----------+--------------+ SFJ      Full                                                        +---------+---------------+---------+-----------+----------+--------------+ FV Prox  Full           Yes      Yes                                 +---------+---------------+---------+-----------+----------+--------------+ FV Mid   Full           Yes      Yes                                 +---------+---------------+---------+-----------+----------+--------------+ FV DistalFull           Yes      Yes                                 +---------+---------------+---------+-----------+----------+--------------+ PFV      Full                                                        +---------+---------------+---------+-----------+----------+--------------+ POP      Full           Yes      Yes                                 +---------+---------------+---------+-----------+----------+--------------+ PTV      Full                                                        +---------+---------------+---------+-----------+----------+--------------+  PERO     Full                                                        +---------+---------------+---------+-----------+----------+--------------+   +---------+---------------+---------+-----------+----------+--------------+ LEFT     CompressibilityPhasicitySpontaneityPropertiesThrombus Aging +---------+---------------+---------+-----------+----------+--------------+ CFV      Full           Yes      Yes                                 +---------+---------------+---------+-----------+----------+--------------+ SFJ      Full                                                         +---------+---------------+---------+-----------+----------+--------------+ FV Prox  Full           Yes      Yes                                 +---------+---------------+---------+-----------+----------+--------------+ FV Mid   Full           Yes      Yes                                 +---------+---------------+---------+-----------+----------+--------------+ FV DistalFull           Yes      Yes                                 +---------+---------------+---------+-----------+----------+--------------+ PFV      Full                                                        +---------+---------------+---------+-----------+----------+--------------+ POP      Full           Yes      Yes                                 +---------+---------------+---------+-----------+----------+--------------+ PTV      Full                                                        +---------+---------------+---------+-----------+----------+--------------+ PERO     Full                                                        +---------+---------------+---------+-----------+----------+--------------+  Summary: BILATERAL: - No evidence of deep vein thrombosis seen in the lower extremities, bilaterally. -No evidence of popliteal cyst, bilaterally.   *See table(s) above for measurements and observations.    Preliminary    US THYROID  Result Date: 09/13/2022 CLINICAL DATA:  Incidental on CT. Left thyroid nodule detected by CTA of the chest. EXAM: THYROID ULTRASOUND TECHNIQUE: Ultrasound examination of the thyroid gland and adjacent soft tissues was performed. COMPARISON:  CTA of the chest on 09/12/2022 FINDINGS: Parenchymal Echotexture: Mildly heterogenous Isthmus: 0.4 cm Right lobe: 3.8 x 1.4 x 1.6 cm Left lobe: 5.1 x 1.6 x 1.6 cm _________________________________________________________ Estimated total number of nodules >/= 1 cm: 1 Number of spongiform nodules >/=  2 cm  not described below (TR1): 0 Number of mixed cystic and solid nodules >/= 1.5 cm not described below (TR2): 0 _________________________________________________________ Nodule # 1: Location: Right; Inferior Maximum size: 0.8 cm; Other 2 dimensions: 0.6 x 0.7 cm Composition: spongiform (0) Echogenicity: anechoic (0) Shape: not taller-than-wide (0) Margins: smooth (0) Echogenic foci: none (0) ACR TI-RADS total points: 0. ACR TI-RADS risk category: TR1 (0-1 points). ACR TI-RADS recommendations: This nodule does NOT meet TI-RADS criteria for biopsy or dedicated follow-up. _________________________________________________________ Nodule # 2: Location: Left; Inferior Maximum size: 2.3 cm; Other 2 dimensions: 1.5 x 2.2 cm Composition: solid/almost completely solid (2) Echogenicity: isoechoic (1) Shape: not taller-than-wide (0) Margins: smooth (0) Echogenic foci: none (0) ACR TI-RADS total points: 3. ACR TI-RADS risk category: TR3 (3 points). ACR TI-RADS recommendations: *Given size (>/= 1.5 - 2.4 cm) and appearance, a follow-up ultrasound in 1 year should be considered based on TI-RADS criteria. _________________________________________________________ No enlarged or abnormal appearing lymph nodes are identified. IMPRESSION: 1. 2.3 cm left inferior thyroid nodule (nodule 2) meets criteria for 1 year follow-up ultrasound. 2. 0.8 cm right inferior thyroid nodule does not meet criteria for biopsy or follow-up. The above is in keeping with the ACR TI-RADS recommendations - J Am Coll Radiol 2017;14:587-595. Electronically Signed   By: Irish Lack M.D.   On: 09/13/2022 08:08   CT Angio Chest PE W and/or Wo Contrast  Result Date: 09/12/2022 CLINICAL DATA:  Pulmonary embolism (PE) suspected, high prob EXAM: CT ANGIOGRAPHY CHEST WITH CONTRAST TECHNIQUE: Multidetector CT imaging of the chest was performed using the standard protocol during bolus administration of intravenous contrast. Multiplanar CT image reconstructions and  MIPs were obtained to evaluate the vascular anatomy. RADIATION DOSE REDUCTION: This exam was performed according to the departmental dose-optimization program which includes automated exposure control, adjustment of the mA and/or kV according to patient size and/or use of iterative reconstruction technique. CONTRAST:  80mL OMNIPAQUE IOHEXOL 350 MG/ML SOLN COMPARISON:  Chest x-ray 09/12/2022, CT chest 06/04/2020 FINDINGS: Cardiovascular: Satisfactory opacification of the pulmonary arteries to the segmental level. No evidence of pulmonary embolism. Limited evaluation of the subsegmental level due to motion artifact and lung findings. The main pulmonary artery is normal in caliber. Normal heart size. No significant pericardial effusion. The thoracic aorta is normal in caliber. No atherosclerotic plaque of the thoracic aorta. No coronary artery calcifications. Mediastinum/Nodes: Prominent mediastinal and hilar lymph nodes. Enlarged superior anterior 1.8 cm lymph node versus left thyroid gland nodule. No enlarged mediastinal, hilar, or axillary lymph nodes. Thyroid gland, trachea, and esophagus demonstrate no significant findings. Tiny hiatal hernia. Lungs/Pleura: Diffuse right lung and to lesser extent left lung peribronchovascular consolidative patchy airspace opacities. No pulmonary mass. Trace bilateral pleural effusions, right greater than left. No pneumothorax. Upper Abdomen: Status post cholecystectomy.  No acute abnormality. Musculoskeletal: No chest wall abnormality. No suspicious lytic or blastic osseous lesions. No acute displaced fracture. Review of the MIP images confirms the above findings. IMPRESSION: 1. No pulmonary embolus. 2. Diffuse right lung and to lesser extent left lung peribronchovascular consolidative patchy airspace opacities. Findings suggestive of edema or alveolar hemorrhage. Superimposed infection/inflammation not excluded. Recommend follow-up CT chest in 3 months to evaluate for complete  resolution. 3. Trace bilateral pleural effusions, right greater than left. 4. Prominent mediastinal lymph nodes and hilar lymph nodes-recommend attention on follow-up. Likely reactive. 5. Enlarged superior anterior 1.8 cm lymph node versus left thyroid gland nodule. Recommend thyroid US (ref: J Am Coll Radiol. 2015 Feb;12(2): 143-50). Electronically Signed   By: Tish Frederickson M.D.   On: 09/12/2022 23:51   CT Head Wo Contrast  Result Date: 09/12/2022 CLINICAL DATA:  Headache, neuro deficit EXAM: CT HEAD WITHOUT CONTRAST TECHNIQUE: Contiguous axial images were obtained from the base of the skull through the vertex without intravenous contrast. RADIATION DOSE REDUCTION: This exam was performed according to the departmental dose-optimization program which includes automated exposure control, adjustment of the mA and/or kV according to patient size and/or use of iterative reconstruction technique. COMPARISON:  CT head 06/04/2020 FINDINGS: Brain: No evidence of large-territorial acute infarction. No parenchymal hemorrhage. No mass lesion. No extra-axial collection. No mass effect or midline shift. No hydrocephalus. Basilar cisterns are patent. Vascular: No hyperdense vessel. Skull: No acute fracture or focal lesion. Sinuses/Orbits: Paranasal sinuses and mastoid air cells are clear. The orbits are unremarkable. Other: None. IMPRESSION: No acute intracranial abnormality. Electronically Signed   By: Tish Frederickson M.D.   On: 09/12/2022 23:44   DG Chest Portable 1 View  Result Date: 09/12/2022 CLINICAL DATA:  Chest pain with shortness of breath EXAM: PORTABLE CHEST 1 VIEW COMPARISON:  Chest x-ray 11/29/2019 FINDINGS: There is multifocal airspace disease in the right mid and upper lung worrisome for pneumonia. There is no pleural effusion or pneumothorax. There is prominence of the right hilum, new from the prior examination. Heart size is within normal limits. No acute fractures are seen. IMPRESSION: 1. Multifocal  airspace disease in the right mid and upper lung worrisome for pneumonia. Followup PA and lateral chest X-ray is recommended in 3-4 weeks following trial of antibiotic therapy to ensure resolution and exclude underlying malignancy. 2. New prominence of the right hilum may be related to reactive lymphadenopathy. Attention on follow-up x-ray recommended. Electronically Signed   By: Darliss Cheney M.D.   On: 09/12/2022 22:22    Microbiology: Results for orders placed or performed during the hospital encounter of 09/12/22  Resp panel by RT-PCR (RSV, Flu A&B, Covid) Anterior Nasal Swab     Status: None   Collection Time: 09/12/22 10:20 PM   Specimen: Anterior Nasal Swab  Result Value Ref Range Status   SARS Coronavirus 2 by RT PCR NEGATIVE NEGATIVE Final    Comment: (NOTE) SARS-CoV-2 target nucleic acids are NOT DETECTED.  The SARS-CoV-2 RNA is generally detectable in upper respiratory specimens during the acute phase of infection. The lowest concentration of SARS-CoV-2 viral copies this assay can detect is 138 copies/mL. A negative result does not preclude SARS-Cov-2 infection and should not be used as the sole basis for treatment or other patient management decisions. A negative result may occur with  improper specimen collection/handling, submission of specimen other than nasopharyngeal swab, presence of viral mutation(s) within the areas targeted by this assay, and inadequate number of viral copies(<138 copies/mL). A negative  result must be combined with clinical observations, patient history, and epidemiological information. The expected result is Negative.  Fact Sheet for Patients:  BloggerCourse.com  Fact Sheet for Healthcare Providers:  SeriousBroker.it  This test is no t yet approved or cleared by the [place] FDA and  has been authorized for detection and/or diagnosis of SARS-CoV-2 by FDA under an Emergency Use Authorization  (EUA). This EUA will remain  in effect (meaning this test can be used) for the duration of the COVID-19 declaration under Section 564(b)(1) of the Act, 21 U.S.C.section 360bbb-3(b)(1), unless the authorization is terminated  or revoked sooner.       Influenza A by PCR NEGATIVE NEGATIVE Final   Influenza B by PCR NEGATIVE NEGATIVE Final    Comment: (NOTE) The Xpert Xpress SARS-CoV-2/FLU/RSV plus assay is intended as an aid in the diagnosis of influenza from Nasopharyngeal swab specimens and should not be used as a sole basis for treatment. Nasal washings and aspirates are unacceptable for Xpert Xpress SARS-CoV-2/FLU/RSV testing.  Fact Sheet for Patients: BloggerCourse.com  Fact Sheet for Healthcare Providers: SeriousBroker.it  This test is not yet approved or cleared by the [place] FDA and has been authorized for detection and/or diagnosis of SARS-CoV-2 by FDA under an Emergency Use Authorization (EUA). This EUA will remain in effect (meaning this test can be used) for the duration of the COVID-19 declaration under Section 564(b)(1) of the Act, 21 U.S.C. section 360bbb-3(b)(1), unless the authorization is terminated or revoked.     Resp Syncytial Virus by PCR NEGATIVE NEGATIVE Final    Comment: (NOTE) Fact Sheet for Patients: BloggerCourse.com  Fact Sheet for Healthcare Providers: SeriousBroker.it  This test is not yet approved or cleared by the [place] FDA and has been authorized for detection and/or diagnosis of SARS-CoV-2 by FDA under an Emergency Use Authorization (EUA). This EUA will remain in effect (meaning this test can be used) for the duration of the COVID-19 declaration under Section 564(b)(1) of the Act, 21 U.S.C. section 360bbb-3(b)(1), unless the authorization is terminated or revoked.  Performed at [facility], [address] [place], [place] [address]     Labs: CBC: Recent Labs  Lab 09/12/22 2220  WBC 12.8*  HGB 12.3  HCT 39.1  MCV 93.8  PLT 260   Basic Metabolic Panel: Recent Labs  Lab 09/12/22 2220  NA 139  K 3.1*  CL 106  CO2 26  GLUCOSE 102*  BUN 11  CREATININE 0.73  CALCIUM 8.3*   Liver Function Tests: No results for input(s): "AST", "ALT", "ALKPHOS", "BILITOT", "PROT", "ALBUMIN" in the last 168 hours. CBG: No results for input(s): "GLUCAP" in the last 168 hours.  Discharge time spent: greater than 30 minutes.  Signed: Tyrone Nine, MD [facility] 09/13/2022

## 2022-09-13 NOTE — Plan of Care (Signed)

## 2022-09-13 NOTE — Progress Notes (Signed)
BLE venous duplex has been completed.   Results can be found under chart review under CV PROC. 09/13/2022 12:28 PM Richar Dunklee RVT, RDMS

## 2022-09-13 NOTE — Assessment & Plan Note (Signed)
According to [place]MP[place] she is prescribed Norco 10, 1 tab 2x a day.  Last filled earlier this month. Will re-order chronic home norco Avoid escalation of narcotics for chronic pain while IP.

## 2022-09-13 NOTE — Assessment & Plan Note (Signed)
Doesn't appear to be on chronic benzos any more, at least none prescribed recently.

## 2022-09-13 NOTE — Plan of Care (Signed)
  Problem: Education: Goal: Knowledge of General Education information will improve Description: Including pain rating scale, medication(s)/side effects and non-pharmacologic comfort measures Outcome: Progressing   Problem: Safety: Goal: Ability to remain free from injury will improve Outcome: Progressing   

## 2022-09-13 NOTE — Assessment & Plan Note (Signed)
Med rec pending ?

## 2022-09-13 NOTE — Progress Notes (Signed)
SATURATION QUALIFICATIONS: (This note is used to comply with regulatory documentation for home oxygen)  Patient Saturations on Room Air at Rest = 92%  Patient Saturations on Room Air while Ambulating = 90%  Patient Saturations on 0 Liters of oxygen while Ambulating = 95%  Please briefly explain why patient needs home oxygen: Patient does not meet the requirements for home oxygen.

## 2022-09-13 NOTE — TOC Transition Note (Signed)
Transition of Care [facility]) - CM/SW Discharge Note   Patient Details  Name: Diane Warner MRN: [id number] Date of Birth: 05-20-1965  Transition of Care [facility]) CM/SW Contact:  Lanier Clam, RN Phone Number: 09/13/2022, 4:03 PM   Clinical Narrative:  Spoke to patient about d/c plans-Has PCP,pharmacy,safe home-may stay with dtr or mother. Dtr will pick up for d/c home after work. No further CM needs.     Final next level of care: Home/Self Care Barriers to Discharge: No Barriers Identified   Patient Goals and CMS Choice      Discharge Placement                         Discharge Plan and Services Additional resources added to the After Visit Summary for                                       Social Determinants of Health (SDOH) Interventions SDOH Screenings   Food Insecurity: Food Insecurity Present (09/13/2022)  Housing: Medium Risk (09/13/2022)  Transportation Needs: Unmet Transportation Needs (09/13/2022)  Utilities: Not At Risk (09/13/2022)  Tobacco Use: Medium Risk (09/13/2022)     Readmission Risk Interventions     No data to display

## 2022-09-13 NOTE — H&P (Addendum)
History and Physical    Patient: Diane Warner ZOX:[id number] DOB: 12-05-1965 DOA: 09/12/2022 DOS: the patient was seen and examined on 09/13/2022 PCP: [facility]  Patient coming from: Homeless  Chief Complaint:  Chief Complaint  Patient presents with   Cough   HPI: MARIETA MARKOV is a 57 y.o. female with medical history significant of HTN, obesity, homelessness, chronic pain.  Pt in to ED with c/o cough, chills, SOB, generalized weakness.  Inhaler not providing relief.  Pt also has LE swelling of L foot for several days.  Some CP with respiration, sharp, mid sternal.   Review of Systems: As mentioned in the history of present illness. All other systems reviewed and are negative. Past Medical History:  Diagnosis Date   Anxiety    Arthritis    Asthma    Cellulitis    Chronic right hip pain    Degenerative disc disease, thoracic    GERD (gastroesophageal reflux disease)    Hypertension    Irritable bowel syndrome (IBS)    Scoliosis    Past Surgical History:  Procedure Laterality Date   ABDOMINAL HYSTERECTOMY     CESAREAN SECTION  1985   CHOLECYSTECTOMY     TUBAL LIGATION     Social History:  reports that she quit smoking about 8 years ago. She has never used smokeless tobacco. She reports that she does not drink alcohol and does not use drugs.  Allergies  Allergen Reactions   Zofran [Ondansetron Hcl] Hives and Diarrhea   Amoxicillin Nausea Only   Aspirin Other (See Comments)    Gi upset    Compazine Hives   Dilaudid [Hydromorphone Hcl] Itching   Penicillins Nausea Only   Proton Pump Inhibitors Hives    Family History  Problem Relation Age of Onset   Breast cancer Neg Hx     Prior to Admission medications   Medication Sig Start Date End Date Taking? Authorizing Provider  albuterol (PROVENTIL HFA;VENTOLIN HFA) 108 (90 Base) MCG/ACT inhaler Inhale 2 puffs into the lungs every 6 (six) hours as needed for wheezing or shortness of breath.  06/08/18   Phineas Semen, MD  amLODipine (NORVASC) 5 MG tablet Take 1 tablet (5 mg total) by mouth daily. 08/01/18 09/12/22  Cuthriell, Delorise Royals, PA-C  dicyclomine (BENTYL) 10 MG capsule Take 1 capsule (10 mg total) by mouth 4 (four) times daily for 14 days. 04/20/20 09/12/22  Fisher, Roselyn Bering, PA-C  furosemide (LASIX) 20 MG tablet Take 1 tablet (20 mg total) by mouth daily. 08/01/18 09/12/22  Cuthriell, Delorise Royals, PA-C  losartan (COZAAR) 25 MG tablet Take 1 tablet (25 mg total) by mouth daily. 08/01/18 09/12/22  Cuthriell, Delorise Royals, PA-C  promethazine (PHENERGAN) 25 MG tablet Take 1 tablet (25 mg total) by mouth every 6 (six) hours as needed for nausea or vomiting. 04/20/20   Fisher, Roselyn Bering, PA-C  trimethoprim-polymyxin b (POLYTRIM) ophthalmic solution Place 2 drops into the right eye every 6 (six) hours. 08/08/22   Racheal Patches, PA-C    Physical Exam: Vitals:   09/12/22 2201 09/13/22 0117 09/13/22 0126 09/13/22 0146  BP: 132/63 (!) 119/57  132/86  Pulse: 90 89 87 86  Resp: (!) 21 18  20   Temp: 99.5 F (37.5 C) 99.4 F (37.4 C)  98.2 F (36.8 C)  TempSrc: Oral Oral  Oral  SpO2: 95% 91% 95% 93%  Weight:      Height:       Constitutional: NAD, calm, comfortable, falling  asleep while [place] tech performs thyroid US Respiratory: clear to auscultation bilaterally, no wheezing, no crackles. Normal respiratory effort. No accessory muscle use.  Cardiovascular: Regular rate and rhythm  Abdomen: no tenderness, no masses palpated. No hepatosplenomegaly. Bowel sounds positive.  Musculoskeletal: LLE 1-2+ edema, nonpitting Neurologic: CN 2-12 grossly intact. Sensation intact, DTR normal. Strength 5/5 in all 4.  Psychiatric: Normal judgment and insight. Alert and oriented x 3. Normal mood.   Data Reviewed:    CT chest: IMPRESSION: 1. No pulmonary embolus. 2. Diffuse right lung and to lesser extent left lung peribronchovascular consolidative patchy airspace opacities. Findings suggestive of  edema or alveolar hemorrhage. Superimposed infection/inflammation not excluded. Recommend follow-up CT chest in 3 months to evaluate for complete resolution. 3. Trace bilateral pleural effusions, right greater than left. 4. Prominent mediastinal lymph nodes and hilar lymph nodes-recommend attention on follow-up. Likely reactive. 5. Enlarged superior anterior 1.8 cm lymph node versus left thyroid gland nodule. Recommend thyroid US (ref: J Am Coll Radiol. 2015 Feb;12(2): 143-50).  Assessment and Plan: * CAP (community acquired pneumonia) Mild PNA (PORT score 45) but EDP concerned that pt wouldn't fill ABx scripts and so requested hospitalist admit. DDx includes CHF, though this seems less likely with no history of such and nl BNP. PNA pathway Rocephin / azithro COVID, flu, RSV neg Tessalon PRN cough  Lower extremity edema Will get [place] to r/o DVT. CTA neg for PE.  Thyroid nodule Ordering the recommended thyroid US.  Long term prescription benzodiazepine use Doesn't appear to be on chronic benzos any more, at least none prescribed recently.  Chronic pain syndrome [place] she [place] prescribed Norco 10, 1 tab 2x a day.  Last filled earlier this month. Will re-order chronic home norco Avoid escalation of narcotics for chronic pain while IP.  Hypertension Med rec pending.      Advance Care Planning:   Code Status: Full Code  Consults: None  Family Communication: No family in room  Severity of Illness: The appropriate patient status for this patient is OBSERVATION. Observation status is judged to be reasonable and necessary in order to provide the required intensity of service to ensure the patient's safety. The patient's presenting symptoms, physical exam findings, and initial radiographic and laboratory data in the context of their medical condition is felt to place them at decreased risk for further clinical deterioration. Furthermore, it is anticipated that the  patient will be medically stable for discharge from the hospital within 2 midnights of admission.   Author: Hillary Bow., DO 09/13/2022 5:46 AM  For on call review [email or web address].

## 2022-09-13 NOTE — Assessment & Plan Note (Signed)
Will get [place] to r/o DVT. CTA neg for PE.

## 2022-09-29 ENCOUNTER — Other Ambulatory Visit (INDEPENDENT_AMBULATORY_CARE_PROVIDER_SITE_OTHER): Payer: Self-pay | Admitting: Nurse Practitioner

## 2022-09-29 DIAGNOSIS — I83812 Varicose veins of left lower extremities with pain: Secondary | ICD-10-CM

## 2022-10-04 ENCOUNTER — Encounter (INDEPENDENT_AMBULATORY_CARE_PROVIDER_SITE_OTHER): Payer: Medicare HMO

## 2022-10-04 ENCOUNTER — Encounter (INDEPENDENT_AMBULATORY_CARE_PROVIDER_SITE_OTHER): Payer: Self-pay | Admitting: Nurse Practitioner

## 2022-11-02 ENCOUNTER — Encounter (INDEPENDENT_AMBULATORY_CARE_PROVIDER_SITE_OTHER): Payer: Medicare HMO

## 2022-11-02 ENCOUNTER — Encounter (INDEPENDENT_AMBULATORY_CARE_PROVIDER_SITE_OTHER): Payer: Self-pay | Admitting: Nurse Practitioner

## 2022-12-28 ENCOUNTER — Encounter (INDEPENDENT_AMBULATORY_CARE_PROVIDER_SITE_OTHER): Payer: Medicare HMO

## 2022-12-28 ENCOUNTER — Encounter (INDEPENDENT_AMBULATORY_CARE_PROVIDER_SITE_OTHER): Payer: Medicare HMO | Admitting: Nurse Practitioner

## 2023-01-20 ENCOUNTER — Encounter (INDEPENDENT_AMBULATORY_CARE_PROVIDER_SITE_OTHER): Payer: Medicare HMO | Admitting: Nurse Practitioner

## 2023-01-20 ENCOUNTER — Encounter (INDEPENDENT_AMBULATORY_CARE_PROVIDER_SITE_OTHER): Payer: Medicare HMO

## 2023-07-20 ENCOUNTER — Other Ambulatory Visit: Payer: Self-pay | Admitting: Adult Medicine

## 2023-07-20 DIAGNOSIS — Z1231 Encounter for screening mammogram for malignant neoplasm of breast: Secondary | ICD-10-CM

## 2023-10-03 ENCOUNTER — Encounter (INDEPENDENT_AMBULATORY_CARE_PROVIDER_SITE_OTHER): Payer: Self-pay

## 2023-12-27 ENCOUNTER — Emergency Department

## 2023-12-27 ENCOUNTER — Other Ambulatory Visit: Payer: Self-pay

## 2023-12-27 ENCOUNTER — Emergency Department
Admission: EM | Admit: 2023-12-27 | Discharge: 2023-12-27 | Disposition: A | Discharge status: Home / Self Care | Attending: Emergency Medicine | Admitting: Emergency Medicine

## 2023-12-27 DIAGNOSIS — M51369 Other intervertebral disc degeneration, lumbar region without mention of lumbar back pain or lower extremity pain: Secondary | ICD-10-CM | POA: Insufficient documentation

## 2023-12-27 DIAGNOSIS — R519 Headache, unspecified: Secondary | ICD-10-CM | POA: Insufficient documentation

## 2023-12-27 DIAGNOSIS — M4802 Spinal stenosis, cervical region: Secondary | ICD-10-CM | POA: Insufficient documentation

## 2023-12-27 DIAGNOSIS — Y92524 Gas station as the place of occurrence of the external cause: Secondary | ICD-10-CM | POA: Insufficient documentation

## 2023-12-27 DIAGNOSIS — E041 Nontoxic single thyroid nodule: Secondary | ICD-10-CM | POA: Diagnosis not present

## 2023-12-27 DIAGNOSIS — S199XXA Unspecified injury of neck, initial encounter: Secondary | ICD-10-CM | POA: Diagnosis present

## 2023-12-27 DIAGNOSIS — S39012A Strain of muscle, fascia and tendon of lower back, initial encounter: Secondary | ICD-10-CM

## 2023-12-27 DIAGNOSIS — S161XXA Strain of muscle, fascia and tendon at neck level, initial encounter: Secondary | ICD-10-CM

## 2023-12-27 MED ORDER — MORPHINE SULFATE (PF) 4 MG/ML IV SOLN
10.0000 mg | Freq: Once | INTRAVENOUS | Status: AC
  Administered 2023-12-27 (×2): 10 mg via INTRAMUSCULAR
  Filled 2023-12-27: qty 3

## 2023-12-27 MED ORDER — LIDOCAINE 5 % EX PTCH
2.0000 | MEDICATED_PATCH | CUTANEOUS | Status: DC
  Administered 2023-12-27 (×2): 2 via TRANSDERMAL
  Filled 2023-12-27: qty 2

## 2023-12-27 MED ORDER — CYCLOBENZAPRINE HCL 5 MG PO TABS: 5.0000 mg | ORAL_TABLET | Freq: Three times a day (TID) | ORAL | 0 refills | Status: DC | PRN

## 2023-12-27 MED ORDER — OXYCODONE-ACETAMINOPHEN 5-325 MG PO TABS
1.0000 | ORAL_TABLET | Freq: Once | ORAL | Status: AC
  Administered 2023-12-27 (×2): 1 via ORAL
  Filled 2023-12-27: qty 1
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
## 2023-12-27 NOTE — Group Note (Deleted)
 Date:  12/27/2023 Time:  2:21 PM  Group Topic/Focus:  Wellness Toolbox:   The focus of this group is to discuss various aspects of wellness, balancing those aspects and exploring ways to increase the ability to experience wellness.  Patients will create a wellness toolbox for use upon discharge.     Participation Level:  {BHH PARTICIPATION OZCZO:77735}  Participation Quality:  {BHH PARTICIPATION QUALITY:22265}  Affect:  {BHH AFFECT:22266}  Cognitive:  {BHH COGNITIVE:22267}  Insight: {BHH Insight2:20797}  Engagement in Group:  {BHH ENGAGEMENT IN HMNLE:77731}  Modes of Intervention:  {BHH MODES OF INTERVENTION:22269}  Additional Comments:  ***  Myra Curtistine BROCKS 12/27/2023, 2:21 PM
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
## 2024-04-14 ENCOUNTER — Emergency Department

## 2024-04-14 ENCOUNTER — Emergency Department: Admission: EM | Admit: 2024-04-14 | Discharge: 2024-04-14 | Disposition: A | Discharge status: Home / Self Care

## 2024-04-14 ENCOUNTER — Other Ambulatory Visit: Payer: Self-pay

## 2024-04-14 DIAGNOSIS — S76012A Strain of muscle, fascia and tendon of left hip, initial encounter: Secondary | ICD-10-CM | POA: Insufficient documentation

## 2024-04-14 DIAGNOSIS — J45909 Unspecified asthma, uncomplicated: Secondary | ICD-10-CM | POA: Diagnosis not present

## 2024-04-14 DIAGNOSIS — S46911A Strain of unspecified muscle, fascia and tendon at shoulder and upper arm level, right arm, initial encounter: Secondary | ICD-10-CM | POA: Insufficient documentation

## 2024-04-14 DIAGNOSIS — Y92410 Unspecified street and highway as the place of occurrence of the external cause: Secondary | ICD-10-CM | POA: Diagnosis not present

## 2024-04-14 DIAGNOSIS — M25552 Pain in left hip: Secondary | ICD-10-CM | POA: Diagnosis present

## 2024-04-14 DIAGNOSIS — S86912A Strain of unspecified muscle(s) and tendon(s) at lower leg level, left leg, initial encounter: Secondary | ICD-10-CM | POA: Insufficient documentation

## 2024-04-14 DIAGNOSIS — I10 Essential (primary) hypertension: Secondary | ICD-10-CM | POA: Insufficient documentation

## 2024-04-14 MED ORDER — OXYCODONE HCL 5 MG PO TABS
5.0000 mg | ORAL_TABLET | Freq: Once | ORAL | Status: AC
  Administered 2024-04-14: 5 mg via ORAL
  Filled 2024-04-14: qty 1

## 2024-04-14 MED ORDER — METHOCARBAMOL 500 MG PO TABS: 500.0000 mg | ORAL_TABLET | Freq: Three times a day (TID) | ORAL | 0 refills | Status: AC | PRN

## 2024-04-14 MED ORDER — ACETAMINOPHEN 500 MG PO TABS
1000.0000 mg | ORAL_TABLET | Freq: Once | ORAL | Status: AC
  Administered 2024-04-14: 1000 mg via ORAL
  Filled 2024-04-14: qty 2

## 2024-04-14 MED ORDER — METHOCARBAMOL 500 MG PO TABS
1000.0000 mg | ORAL_TABLET | Freq: Once | ORAL | Status: AC
  Administered 2024-04-14: 1000 mg via ORAL
  Filled 2024-04-14: qty 2
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
